# Patient Record
Sex: Male | Born: 1950 | Race: White | Hispanic: No | Marital: Married | Smoking: Never smoker
Health system: Southern US, Community
[De-identification: ages and names within clinical notes are randomized; demographics above are authoritative.]

## PROBLEM LIST (undated history)

## (undated) DIAGNOSIS — I639 Cerebral infarction, unspecified: Secondary | ICD-10-CM

## (undated) DIAGNOSIS — I10 Essential (primary) hypertension: Secondary | ICD-10-CM

## (undated) DIAGNOSIS — E785 Hyperlipidemia, unspecified: Secondary | ICD-10-CM

## (undated) DIAGNOSIS — T7840XA Allergy, unspecified, initial encounter: Secondary | ICD-10-CM

## (undated) DIAGNOSIS — K648 Other hemorrhoids: Secondary | ICD-10-CM

## (undated) HISTORY — DX: Hyperlipidemia, unspecified: E78.5

## (undated) HISTORY — DX: Cerebral infarction, unspecified: I63.9

## (undated) HISTORY — DX: Allergy, unspecified, initial encounter: T78.40XA

## (undated) HISTORY — DX: Other hemorrhoids: K64.8

## (undated) HISTORY — DX: Essential (primary) hypertension: I10

---

## 1970-04-05 HISTORY — PX: TONSILLECTOMY: SUR1361

## 1997-08-19 ENCOUNTER — Encounter: Payer: Self-pay | Admitting: Family Medicine

## 1997-08-19 LAB — CONVERTED CEMR LAB: Blood Glucose, Fasting: 96 mg/dL

## 2002-08-03 ENCOUNTER — Encounter: Payer: Self-pay | Admitting: Family Medicine

## 2002-08-03 LAB — CONVERTED CEMR LAB
Blood Glucose, Fasting: 82 mg/dL
TSH: 0.894 microintl units/mL

## 2002-08-06 ENCOUNTER — Encounter: Payer: Self-pay | Admitting: Family Medicine

## 2002-08-06 LAB — CONVERTED CEMR LAB: PSA: 0.62 ng/mL

## 2002-10-16 ENCOUNTER — Ambulatory Visit (HOSPITAL_BASED_OUTPATIENT_CLINIC_OR_DEPARTMENT_OTHER): Admission: RE | Admit: 2002-10-16 | Discharge: 2002-10-16 | Payer: Self-pay | Admitting: Surgery

## 2002-10-16 HISTORY — PX: INGUINAL HERNIA REPAIR: SUR1180

## 2003-09-19 ENCOUNTER — Encounter: Payer: Self-pay | Admitting: Family Medicine

## 2003-09-19 LAB — CONVERTED CEMR LAB
Blood Glucose, Fasting: 102 mg/dL
PSA: 0.7 ng/mL
TSH: 0.93 u[IU]/mL

## 2004-04-05 HISTORY — PX: COLONOSCOPY: SHX174

## 2004-04-08 ENCOUNTER — Ambulatory Visit: Payer: Self-pay | Admitting: Family Medicine

## 2004-04-29 ENCOUNTER — Ambulatory Visit: Payer: Self-pay | Admitting: Family Medicine

## 2004-05-24 ENCOUNTER — Emergency Department (HOSPITAL_COMMUNITY): Admission: EM | Admit: 2004-05-24 | Discharge: 2004-05-24 | Payer: Self-pay | Admitting: Emergency Medicine

## 2004-05-27 ENCOUNTER — Emergency Department (HOSPITAL_COMMUNITY): Admission: EM | Admit: 2004-05-27 | Discharge: 2004-05-27 | Payer: Self-pay | Admitting: Emergency Medicine

## 2004-08-26 ENCOUNTER — Ambulatory Visit: Payer: Self-pay | Admitting: Internal Medicine

## 2005-01-19 ENCOUNTER — Ambulatory Visit: Payer: Self-pay | Admitting: Family Medicine

## 2005-01-19 LAB — CONVERTED CEMR LAB
Blood Glucose, Fasting: 103 mg/dL
Hgb A1c MFr Bld: 4.9 %
PSA: 0.76 ng/mL

## 2005-01-22 ENCOUNTER — Ambulatory Visit: Payer: Self-pay | Admitting: Family Medicine

## 2005-02-04 ENCOUNTER — Ambulatory Visit: Payer: Self-pay | Admitting: Gastroenterology

## 2005-02-19 ENCOUNTER — Ambulatory Visit: Payer: Self-pay | Admitting: Gastroenterology

## 2005-02-19 LAB — HM COLONOSCOPY

## 2005-03-11 LAB — FECAL OCCULT BLOOD, GUAIAC: Fecal Occult Blood: NEGATIVE

## 2005-03-16 ENCOUNTER — Ambulatory Visit: Payer: Self-pay | Admitting: Family Medicine

## 2006-05-31 ENCOUNTER — Ambulatory Visit: Payer: Self-pay | Admitting: Family Medicine

## 2006-05-31 LAB — CONVERTED CEMR LAB
ALT: 21 units/L (ref 0–40)
Albumin: 3.9 g/dL (ref 3.5–5.2)
Alkaline Phosphatase: 53 units/L (ref 39–117)
BUN: 12 mg/dL (ref 6–23)
Basophils Absolute: 0 10*3/uL (ref 0.0–0.1)
Basophils Relative: 0.6 % (ref 0.0–1.0)
Blood Glucose, Fasting: 111 mg/dL
CO2: 28 meq/L (ref 19–32)
Calcium: 8.9 mg/dL (ref 8.4–10.5)
Cholesterol: 174 mg/dL (ref 0–200)
Direct LDL: 114.4 mg/dL
Eosinophils Absolute: 0.2 10*3/uL (ref 0.0–0.6)
GFR calc Af Amer: 129 mL/min
GFR calc non Af Amer: 107 mL/min
HDL: 31 mg/dL — ABNORMAL LOW (ref >39.0)
Lymphocytes Relative: 21.3 % (ref 12.0–46.0)
MCHC: 35.2 g/dL (ref 30.0–36.0)
Monocytes Absolute: 0.5 10*3/uL (ref 0.2–0.7)
Monocytes Relative: 9.1 % (ref 3.0–11.0)
Neutro Abs: 3.7 10*3/uL (ref 1.4–7.7)
Platelets: 179 10*3/uL (ref 150–400)
Potassium: 4.1 meq/L (ref 3.5–5.1)
TSH: 1.22 microintl units/mL (ref 0.35–5.50)
Total CHOL/HDL Ratio: 5.6
VLDL: 45 mg/dL — ABNORMAL HIGH (ref 0–40)
WBC, blood: 5.6 10*3/uL

## 2006-06-02 ENCOUNTER — Ambulatory Visit: Payer: Self-pay | Admitting: Family Medicine

## 2007-07-10 ENCOUNTER — Ambulatory Visit: Payer: Self-pay | Admitting: Family Medicine

## 2007-07-10 LAB — CONVERTED CEMR LAB
ALT: 23 units/L (ref 0–53)
Albumin: 4.1 g/dL (ref 3.5–5.2)
BUN: 17 mg/dL (ref 6–23)
Bilirubin, Direct: 0.1 mg/dL (ref 0.0–0.3)
CO2: 28 meq/L (ref 19–32)
Calcium: 8.9 mg/dL (ref 8.4–10.5)
Cholesterol: 183 mg/dL (ref 0–200)
GFR calc Af Amer: 99 mL/min
Glucose, Bld: 104 mg/dL — ABNORMAL HIGH (ref 70–99)
HDL: 33.2 mg/dL — ABNORMAL LOW (ref >39.0)
Microalb Creat Ratio: 9.5 mg/g (ref 0.0–30.0)
Microalb, Ur: 1.4 mg/dL (ref 0.0–1.9)
Sodium: 143 meq/L (ref 135–145)
Total Protein: 6.9 g/dL (ref 6.0–8.3)
Triglycerides: 146 mg/dL (ref 0–149)
VLDL: 29 mg/dL (ref 0–40)

## 2007-07-11 ENCOUNTER — Encounter: Payer: Self-pay | Admitting: Family Medicine

## 2007-07-11 DIAGNOSIS — E7800 Pure hypercholesterolemia, unspecified: Secondary | ICD-10-CM

## 2007-07-17 ENCOUNTER — Ambulatory Visit: Payer: Self-pay | Admitting: Family Medicine

## 2007-09-18 ENCOUNTER — Emergency Department (HOSPITAL_COMMUNITY): Admission: EM | Admit: 2007-09-18 | Discharge: 2007-09-18 | Payer: Self-pay | Admitting: Family Medicine

## 2007-12-06 ENCOUNTER — Encounter: Payer: Self-pay | Admitting: Family Medicine

## 2008-07-17 ENCOUNTER — Ambulatory Visit: Payer: Self-pay | Admitting: Family Medicine

## 2008-07-17 LAB — CONVERTED CEMR LAB
ALT: 28 units/L (ref 0–53)
AST: 24 units/L (ref 0–37)
Alkaline Phosphatase: 59 units/L (ref 39–117)
Bilirubin, Direct: 0 mg/dL (ref 0.0–0.3)
CO2: 30 meq/L (ref 19–32)
Chloride: 107 meq/L (ref 96–112)
Glucose, Bld: 94 mg/dL (ref 70–99)
Microalb, Ur: 0.5 mg/dL (ref 0.0–1.9)
Sodium: 140 meq/L (ref 135–145)
Total CHOL/HDL Ratio: 5
Total Protein: 6.5 g/dL (ref 6.0–8.3)

## 2008-07-23 ENCOUNTER — Ambulatory Visit: Payer: Self-pay | Admitting: Family Medicine

## 2009-07-21 ENCOUNTER — Ambulatory Visit: Payer: Self-pay | Admitting: Family Medicine

## 2009-07-21 LAB — CONVERTED CEMR LAB
ALT: 26 units/L (ref 0–53)
AST: 21 units/L (ref 0–37)
Albumin: 4.3 g/dL (ref 3.5–5.2)
Alkaline Phosphatase: 54 units/L (ref 39–117)
CO2: 26 meq/L (ref 19–32)
Chloride: 110 meq/L (ref 96–112)
Creatinine,U: 247.8 mg/dL
GFR calc non Af Amer: 91.94 mL/min (ref >60)
Glucose, Bld: 100 mg/dL — ABNORMAL HIGH (ref 70–99)
Microalb Creat Ratio: 2.8 mg/g (ref 0.0–30.0)
Microalb, Ur: 0.7 mg/dL (ref 0.0–1.9)
Potassium: 4 meq/L (ref 3.5–5.1)
Sodium: 143 meq/L (ref 135–145)
TSH: 1.38 microintl units/mL (ref 0.35–5.50)
VLDL: 16.8 mg/dL (ref 0.0–40.0)

## 2009-07-24 ENCOUNTER — Ambulatory Visit: Payer: Self-pay | Admitting: Family Medicine

## 2009-11-10 ENCOUNTER — Encounter (INDEPENDENT_AMBULATORY_CARE_PROVIDER_SITE_OTHER): Payer: Self-pay | Admitting: *Deleted

## 2009-12-16 ENCOUNTER — Encounter (INDEPENDENT_AMBULATORY_CARE_PROVIDER_SITE_OTHER): Payer: Self-pay | Admitting: *Deleted

## 2010-05-05 NOTE — Assessment & Plan Note (Signed)
Summary: CPX/CLE   Vital Signs:  Patient profile:   60 year old male Height:      70 inches Weight:      197.75 pounds BMI:     28.48 Temp:     98.4 degrees F oral Pulse rate:   60 / minute Pulse rhythm:   regular BP sitting:   124 / 74  (left arm) Cuff size:   regular  Vitals Entered By: Sydell Axon LPN (July 24, 2009 2:51 PM) CC: 30 Minute checkup, had a colonoscopy 11/06 at [facility] in [place]   History of Present Illness: Pt hee for Comp Exam, healthy as usual. He has no complaints and feels well.   Preventive Screening-Counseling & Management  Alcohol-Tobacco     Alcohol drinks/day: <1     Alcohol type: beer rarely     Smoking Status: never     Passive Smoke Exposure: no  Caffeine-Diet-Exercise     Caffeine use/day: 0     Does Patient Exercise: yes     Type of exercise: treadmill, weights     Times/week: 3  Problems Prior to Update: 1)  Health Maintenance Exam  (ICD-V70.0) 2)  Special Screening Malignant Neoplasm of Prostate  (ICD-V76.44) 3)  Hypercholesterolemia  (ICD-272.0) 4)  Other Abnormal Blood Chemistry Glu  (ICD-790.6)  Medications Prior to Update: 1)  Fish Oil Concentrate 1000 Mg  Caps (Omega-3 Fatty Acids) .Marland Kitchen.. 1 Daily By Mouth 2)  Multivitamins   Tabs (Multiple Vitamin) .Marland Kitchen.. 1 Daily By Mouth  Allergies: 1)  ! Orange Concentrate Information systems manager)  Past History:  Past Surgical History: Last updated: 07/11/2007 ALLERGIC REACTION HORNETS REQUIRED IMMUNTX 1964 TONSILLECTOMY 1972 RIGHT INQUINAL HERNIA REPAIR:(DR.MARTIN):(10/16/2002) COLONOSCOPY- INTERNAL HEMORRHOIDS REPEAT IN 10 YEARS:(02/19/2005)  Family History: Last updated: 07/24/2009 Father:  dec 85  Pneumonia CABG x 4  COLON CANCER; +PROSTATE CANCER  RADATION Mother A  85  BROTHER A  56  Back Probs SISTER A 62  CV: +GF DECEASED FROM MI HBP: NEGATIVE DM: +COUSIN GOUT ARTHRITIS: PROSTATE CANCER: + FATHER BREAST/OVARIAN/UTERINE CANCER: NEGATIVE COLON CANCER:+ FATHER DEPRESSION:  NEGATIVE ETOH/DRUG ABUSE: NEGATIVE OTHER: NEGATIVE STROKE  Social History: Last updated: 07/11/2007 Marital Status: Married LIVES WITH WIFE Children: 1 AT HOME Occupation: [employer]  Risk Factors: Alcohol Use: <1 (07/24/2009) Caffeine Use: 0 (07/24/2009) Exercise: yes (07/24/2009)  Risk Factors: Smoking Status: never (07/24/2009) Passive Smoke Exposure: no (07/24/2009)  Family History: Father:  dec 85  Pneumonia CABG x 4  COLON CANCER; +PROSTATE CANCER  RADATION Mother A  60  BROTHER A  56  Back Probs SISTER A 62  CV: +GF DECEASED FROM MI HBP: NEGATIVE DM: +COUSIN GOUT ARTHRITIS: PROSTATE CANCER: + FATHER BREAST/OVARIAN/UTERINE CANCER: NEGATIVE COLON CANCER:+ FATHER DEPRESSION: NEGATIVE ETOH/DRUG ABUSE: NEGATIVE OTHER: NEGATIVE STROKE  Review of Systems General:  Denies chills, fatigue, fever, sweats, weakness, and weight loss. Eyes:  Denies blurring, discharge, itching, and red eye. ENT:  Denies decreased hearing, earache, ringing in ears, and sinus pressure. CV:  Denies chest pain or discomfort, fainting, fatigue, palpitations, shortness of breath with exertion, swelling of feet, and swelling of hands. Resp:  Denies cough, shortness of breath, and wheezing. GI:  Denies abdominal pain, bloody stools, change in bowel habits, constipation, dark tarry stools, diarrhea, indigestion, loss of appetite, nausea, vomiting, vomiting blood, and yellowish skin color. GU:  Complains of nocturia; denies discharge, dysuria, urinary frequency, and urinary hesitancy; occaqs. MS:  Denies joint pain, low back pain, muscle aches, cramps, and stiffness; left index DIP  joint discomfort.. Derm:  Denies dryness, itching, and rash. Neuro:  Denies numbness, poor balance, tingling, and tremors.  Physical Exam  General:  Well-developed,well-nourished,in no acute distress; alert,appropriate and cooperative throughout examination Head:  Normocephalic and atraumatic without obvious  abnormalities. No apparent alopecia or balding. Eyes:  Conjunctiva clear bilaterally.  Ears:  External ear exam shows no significant lesions or deformities.  Otoscopic examination reveals clear canals, tympanic membranes are intact bilaterally without bulging, retraction, inflammation or discharge. Hearing is grossly normal bilaterally. Nose:  External nasal examination shows no deformity or inflammation. Nasal mucosa are pink and moist without lesions or exudates. Mouth:  Oral mucosa and oropharynx without lesions or exudates.  Teeth in good repair. Neck:  No deformities, masses, or tenderness noted. Chest Wall:  No deformities, masses, tenderness or gynecomastia noted. Breasts:  No masses or gynecomastia noted Lungs:  Normal respiratory effort, chest expands symmetrically. Lungs are clear to auscultation, no crackles or wheezes. Heart:  Normal rate and regular rhythm. S1 and S2 normal without gallop, murmur, click, rub or other extra sounds. Abdomen:  Bowel sounds positive,abdomen soft and non-tender without masses, organomegaly or hernias noted. Rectal:  No external abnormalities noted. Normal sphincter tone. No rectal masses or tenderness. G neg. Genitalia:  Testes bilaterally descended without nodularity, tenderness or masses. No scrotal masses or lesions. No penis lesions or urethral discharge. Prostate:  Prostate gland firm and smooth, no enlargement, nodularity, tenderness, mass, asymmetry or induration. 20gms. Msk:  No deformity or scoliosis noted of thoracic or lumbar spine.   Pulses:  R and L carotid,radial,femoral,dorsalis pedis and posterior tibial pulses are full and equal bilaterally Extremities:  No clubbing, cyanosis, edema, or deformity noted with normal full range of motion of all joints.   Neurologic:  No cranial nerve deficits noted. Station and gait are normal. Plantar reflexes are down-going bilaterally. DTRs are symmetrical throughout. Sensory, motor and coordinative  functions appear intact. Skin:  Intact without suspicious lesions or rashes Cervical Nodes:  No lymphadenopathy noted Inguinal Nodes:  No significant adenopathy Psych:  Cognition and judgment appear intact. Alert and cooperative with normal attention span and concentration. No apparent delusions, illusions, hallucinations   Impression & Recommendations:  Problem # 1:  HEALTH MAINTENANCE EXAM (ICD-V70.0)  Reviewed preventive care protocols, scheduled due services, and updated immunizations.  Problem # 2:  SPECIAL SCREENING MALIGNANT NEOPLASM OF PROSTATE (ICD-V76.44) Assessment: Unchanged Stable exam and PSA.  Problem # 3:  HYPERCHOLESTEROLEMIA (ICD-272.0) Assessment: Unchanged "OK" nos but try to restrict Fatty foods more. Labs Reviewed: SGOT: 21 (07/21/2009)   SGPT: 26 (07/21/2009)   HDL:39.70 (07/21/2009), 33.70 (07/17/2008)  LDL:130 (07/21/2009), 118 (07/17/2008)  Chol:186 (07/21/2009), 172 (07/17/2008)  Trig:84.0 (07/21/2009), 103.0 (07/17/2008)  Problem # 4:  OTHER ABNORMAL BLOOD CHEMISTRY GLU (ICD-790.6) Assessment: Unchanged Still abnormal but minimally so, cont to avoid sweets and carbs.  Complete Medication List: 1)  Fish Oil Concentrate 1000 Mg Caps (Omega-3 fatty acids) .Marland Kitchen.. 1 daily by mouth 2)  Multivitamins Tabs (Multiple vitamin) .Marland Kitchen.. 1 daily by mouth  Patient Instructions: 1)  RTC yearly or as needed.  Current Allergies (reviewed today): ! ORANGE CONCENTRATE (FLAVORING AGENT)

## 2010-05-05 NOTE — Letter (Signed)
Summary: [name] letter  [place] [facility]  [address] [place], [place] [address]   Phone: [phone]  Fax: [phone]       11/10/2009 MRN: [id number]  Alejandro Schneider [address] [place], [place]  [address]  Dear [name] [facility] [place] [place] announce the retirement of [name], M.D., from full-time practice at the [facility] office effective October 02, 2009 and his plans of returning part-time.  It is important to [name] and to our practice that you understand that [name] has seven physicians in our office for your health care needs.  We will continue to offer the same exceptional care that you have today.    [name] has spoken to many of you about his plans for retirement and returning part-time in the fall.   We will continue to work with you through the transition to schedule appointments for you in the office and meet the high standards that [name] is committed to.   Again, it is with great pleasure that we share the news that [name] will return to [facility] at [facility] in October of 2011 with a reduced schedule.    If you have any questions, or would like to request an appointment with one of our physicians, please call us at [phone] and press the option for Scheduling an appointment.  We take pleasure in providing you with excellent patient care and look forward to seeing you at your next office visit.  Our [facility] Physicians are:  [name], M.D. [name], M.D. [name], M.D. [name], M.D. [name], M.D. [name], M.D. We proudly welcomed [name], M.D. and [name], M.D. to the practice in July/August 2011.  Sincerely,  [name]

## 2010-05-05 NOTE — Letter (Signed)
Summary: Colonoscopy Date Change Letter  [facility]  [address] [place], [place] [address]   Phone: [phone]  Fax: [phone]      December 16, 2009 MRN: [id number]   Alejandro Schneider [address] [place], [place]  [address]   Dear [name],   Previously you were recommended to have a repeat colonoscopy around this time. Your chart was recently reviewed by Dr. Russella Dar of [facility]. Follow up colonoscopy is now recommended in November 2016. This revised recommendation is based on current, nationally recognized guidelines for colorectal cancer screening and polyp surveillance. These guidelines are endorsed by the American Cancer Society, [employer] on Colorectal Cancer as well as numerous other major medical organizations.  Please understand that our recommendation assumes that you do not have any new symptoms such as bleeding, a change in bowel habits, anemia, or significant abdominal discomfort. If you do have any concerning GI symptoms or want to discuss the guideline recommendations, please call to arrange an office visit at your earliest convenience. Otherwise we will keep you in our reminder system and contact you 1-2 months prior to the date listed above to schedule your next colonoscopy.  Thank you,   Judie Petit T. Russella Dar, M.D.  [facility] [phone]

## 2010-06-11 ENCOUNTER — Encounter: Payer: Self-pay | Admitting: Family Medicine

## 2010-06-14 ENCOUNTER — Encounter: Payer: Self-pay | Admitting: Family Medicine

## 2010-06-17 ENCOUNTER — Encounter: Payer: Self-pay | Admitting: Family Medicine

## 2010-07-19 ENCOUNTER — Other Ambulatory Visit: Payer: Self-pay | Admitting: Family Medicine

## 2010-07-19 DIAGNOSIS — E7800 Pure hypercholesterolemia, unspecified: Secondary | ICD-10-CM

## 2010-07-19 DIAGNOSIS — Z0000 Encounter for general adult medical examination without abnormal findings: Secondary | ICD-10-CM

## 2010-07-27 ENCOUNTER — Other Ambulatory Visit (INDEPENDENT_AMBULATORY_CARE_PROVIDER_SITE_OTHER): Payer: 59 | Admitting: Family Medicine

## 2010-07-27 DIAGNOSIS — Z0000 Encounter for general adult medical examination without abnormal findings: Secondary | ICD-10-CM

## 2010-07-27 DIAGNOSIS — E7800 Pure hypercholesterolemia, unspecified: Secondary | ICD-10-CM

## 2010-07-27 LAB — BASIC METABOLIC PANEL
BUN: 16 mg/dL (ref 6–23)
Calcium: 8.5 mg/dL (ref 8.4–10.5)
Creatinine, Ser: 0.8 mg/dL (ref 0.4–1.5)
GFR: 99.21 mL/min (ref >60.00)
Glucose, Bld: 98 mg/dL (ref 70–99)
Potassium: 4.3 mEq/L (ref 3.5–5.1)

## 2010-07-27 LAB — PSA: PSA: 1.19 ng/mL (ref 0.10–4.00)

## 2010-07-27 LAB — HEPATIC FUNCTION PANEL
AST: 17 U/L (ref 0–37)
Total Bilirubin: 0.7 mg/dL (ref 0.3–1.2)

## 2010-07-27 LAB — LIPID PANEL
Cholesterol: 196 mg/dL (ref 0–200)
HDL: 40.3 mg/dL (ref >39.00)
LDL Cholesterol: 137 mg/dL — ABNORMAL HIGH (ref 0–99)
Triglycerides: 96 mg/dL (ref 0.0–149.0)
VLDL: 19.2 mg/dL (ref 0.0–40.0)

## 2010-07-27 LAB — TSH: TSH: 1.57 u[IU]/mL (ref 0.35–5.50)

## 2010-08-12 ENCOUNTER — Encounter: Payer: Self-pay | Admitting: Family Medicine

## 2010-08-12 ENCOUNTER — Ambulatory Visit (INDEPENDENT_AMBULATORY_CARE_PROVIDER_SITE_OTHER): Payer: 59 | Admitting: Family Medicine

## 2010-08-12 DIAGNOSIS — E7800 Pure hypercholesterolemia, unspecified: Secondary | ICD-10-CM

## 2010-08-12 DIAGNOSIS — Z0000 Encounter for general adult medical examination without abnormal findings: Secondary | ICD-10-CM

## 2010-08-12 NOTE — Assessment & Plan Note (Signed)
LDL slightly high. Discussed. Pt is fit and works out regularly. Will cont to monitor.

## 2010-08-12 NOTE — Progress Notes (Signed)
  Subjective:    Patient ID: Alejandro Schneider, male    DOB: Oct 23, 1950, 60 y.o.   MRN: [id number]  HPI Pt here for Comp Exam. He has no complaints and feels well.    Review of Systems  Constitutional: Negative for fever, chills, diaphoresis, appetite change, fatigue and unexpected weight change.  HENT: Negative for hearing loss, ear pain, tinnitus and ear discharge.   Eyes: Negative for pain, discharge and visual disturbance.  Respiratory: Negative for cough, shortness of breath and wheezing.   Cardiovascular: Negative for chest pain and palpitations.       No SOB w/ exertion  Gastrointestinal: Negative for nausea, vomiting, abdominal pain, diarrhea, constipation and blood in stool.       No heartburn or swallowing problems.  Genitourinary: Negative for dysuria, frequency and difficulty urinating.       Occas nocturia  Musculoskeletal: Negative for myalgias, back pain and arthralgias.       Age related.  Skin: Negative for rash.       No itching or dryness.  Neurological: Negative for tremors and numbness.       No tingling or balance problems.  Hematological: Negative for adenopathy. Does not bruise/bleed easily.  Psychiatric/Behavioral: Negative for dysphoric mood and agitation.       Objective:   Physical Exam  Constitutional: He is oriented to person, place, and time. He appears well-developed and well-nourished. No distress.  HENT:  Head: Normocephalic and atraumatic.  Right Ear: External ear normal.  Left Ear: External ear normal.  Nose: Nose normal.  Mouth/Throat: Oropharynx is clear and moist.  Eyes: Conjunctivae and EOM are normal. Pupils are equal, round, and reactive to light. Right eye exhibits no discharge. Left eye exhibits no discharge. No scleral icterus.  Neck: Normal range of motion. Neck supple. No thyromegaly present.  Cardiovascular: Normal rate, regular rhythm, normal heart sounds and intact distal pulses.   No murmur heard. Pulmonary/Chest: Effort  normal and breath sounds normal. No respiratory distress. He has no wheezes.  Abdominal: Soft. Bowel sounds are normal. He exhibits no distension and no mass. There is no tenderness. There is no rebound and no guarding.  Genitourinary: Rectum normal, prostate normal and penis normal. Guaiac negative stool.  Musculoskeletal: Normal range of motion. He exhibits no edema.  Lymphadenopathy:    He has no cervical adenopathy.  Neurological: He is alert and oriented to person, place, and time. Coordination normal.  Skin: Skin is warm and dry. No rash noted. He is not diaphoretic.  Psychiatric: He has a normal mood and affect. His behavior is normal. Judgment and thought content normal.          Assessment & Plan:  HMPE

## 2010-08-12 NOTE — Patient Instructions (Signed)
RTC one year, sooner if BP trends up or other problems present themselves.

## 2010-08-12 NOTE — Assessment & Plan Note (Signed)
Discussed Zostavax. Will have him get it at pharmacy if he desires immun for monetary reasons. Pt's BP higher than usual altho still below threshold. Discussed him monitoring himself and RTC if trends high.

## 2010-08-21 NOTE — Op Note (Signed)
NAME:  Alejandro Schneider, SKELLY NO.:  [id number]   MEDICAL RECORD NO.:  [id number]                   PATIENT TYPE:  AMB   LOCATION:  DSC                                  FACILITY:  [facility]   PHYSICIAN:  Thornton Park. Daphine Deutscher, M.D.             DATE OF BIRTH:  1950-05-07   DATE OF PROCEDURE:  10/16/2002  DATE OF DISCHARGE:                                 OPERATIVE REPORT   PREOPERATIVE DIAGNOSIS:  Right inguinal hernia.   POSTOPERATIVE DIAGNOSIS:  Right direct inguinal hernia.   PROCEDURE:  Right inguinal herniorrhaphy with mesh.   SURGEON:  Thornton Park. Daphine Deutscher, M.D.   ANESTHESIA:  General by LMA.   DESCRIPTION OF PROCEDURE:  Mr. Sauceda is identified and marked in the  holding area. Preoperative informed consent had been obtained regarding  right inguinal herniorrhaphy. He was taken to room 3 at [facility]  day surgery and given general anesthesia. He received 1 gm of Ancef  preoperatively.   A small oblique incision was made in the right inguinal region after  prepping the area with Betadine and draping sterilely. A Bovie was used to  get down to the external oblique. I incised this along the fibers.   I identified the ilioinguinal nerve  branch and separated it with the  superior flap of fascia and then got around the cord structures. The cord  structures were opened and had fat and some slight dilatation of the veins  but otherwise looked normal. There was no indirect sac. However, it had a  very prominent bulge in the floor consistent with a direct inguinal hernia.   After mobilizing the cord and retracting it, I incised this and got a bulge  of preperitoneal fat. I tucked this back inside with the [employer] and then  closed the transversalis fascia with a 2-0 Prolene, again excising the  redundancy. When this had been closed, it restored the floor to fairly level  repaired floor. However, I thought this was weak and  I went ahead and cut a  piece  of mesh to fit and sutured along the inguinal ligament, suturing along  the internal oblique with running 2-0 Prolene. I did encounter a little wall  bleeder on the superomedial closure, but  this stopped when  the running  suture was tied down.   I then cut the mesh to fit around the cord and sutured it in place with a  single horizontal mattress suture of 2-0 Prolene and tucked this below the  external oblique. This allowed room for the ilioinguinal nerve branch to  kind of lie above the mesh and did not appear to be impinged by that. I  irrigated with saline.   I injected the area with 0.5% Marcaine and then closed the external oblique  with running 2-0 Vicryl. Then 4-0 Vicryl was used in the subcutaneous tissue  and then the skin was closed  with a running subcuticular 5-0 Vicryl with  Benzoin and Steri-Strips.   The patient seemed to tolerate the procedure well and was taken to the  recovery room. He will be given Tylox to take for pain. He will be  instructed to return to the office in approximately  2 to 3 weeks. Final  diagnosis was right direct inguinal hernia, status post  repair with mesh  (atrium mesh).                                               Thornton Park Daphine Deutscher, M.D.    MBM/MEDQ  D:  10/16/2002  T:  10/16/2002  Job:  [id number]   cc:   [name], M.D.  [address] [place]  [place] [address]  Fax: [phone]   [facility]  [facility] [phone]

## 2011-08-08 ENCOUNTER — Other Ambulatory Visit: Payer: Self-pay | Admitting: Family Medicine

## 2011-08-08 DIAGNOSIS — Z125 Encounter for screening for malignant neoplasm of prostate: Secondary | ICD-10-CM

## 2011-08-08 DIAGNOSIS — E7800 Pure hypercholesterolemia, unspecified: Secondary | ICD-10-CM

## 2011-08-11 ENCOUNTER — Other Ambulatory Visit (INDEPENDENT_AMBULATORY_CARE_PROVIDER_SITE_OTHER): Payer: 59

## 2011-08-11 DIAGNOSIS — E7800 Pure hypercholesterolemia, unspecified: Secondary | ICD-10-CM

## 2011-08-11 DIAGNOSIS — Z125 Encounter for screening for malignant neoplasm of prostate: Secondary | ICD-10-CM

## 2011-08-11 LAB — COMPREHENSIVE METABOLIC PANEL
ALT: 22 U/L (ref 0–53)
Albumin: 4.3 g/dL (ref 3.5–5.2)
CO2: 26 mEq/L (ref 19–32)
Chloride: 108 mEq/L (ref 96–112)
GFR: 94.94 mL/min (ref >60.00)
Glucose, Bld: 84 mg/dL (ref 70–99)
Potassium: 4 mEq/L (ref 3.5–5.1)
Sodium: 141 mEq/L (ref 135–145)
Total Protein: 7 g/dL (ref 6.0–8.3)

## 2011-08-11 LAB — LIPID PANEL: Total CHOL/HDL Ratio: 5

## 2011-08-11 LAB — PSA: PSA: 1.04 ng/mL (ref 0.10–4.00)

## 2011-08-16 ENCOUNTER — Encounter: Payer: 59 | Admitting: Family Medicine

## 2011-08-20 ENCOUNTER — Encounter: Payer: 59 | Admitting: Family Medicine

## 2011-08-24 ENCOUNTER — Encounter: Payer: Self-pay | Admitting: Family Medicine

## 2011-08-24 ENCOUNTER — Ambulatory Visit (INDEPENDENT_AMBULATORY_CARE_PROVIDER_SITE_OTHER): Payer: 59 | Admitting: Family Medicine

## 2011-08-24 VITALS — BP 130/80 | HR 65 | Temp 98.2°F | Wt 201.0 lb

## 2011-08-24 DIAGNOSIS — D239 Other benign neoplasm of skin, unspecified: Secondary | ICD-10-CM

## 2011-08-24 DIAGNOSIS — Z1211 Encounter for screening for malignant neoplasm of colon: Secondary | ICD-10-CM

## 2011-08-24 DIAGNOSIS — D229 Melanocytic nevi, unspecified: Secondary | ICD-10-CM

## 2011-08-24 DIAGNOSIS — Z0000 Encounter for general adult medical examination without abnormal findings: Secondary | ICD-10-CM

## 2011-08-24 DIAGNOSIS — Z8042 Family history of malignant neoplasm of prostate: Secondary | ICD-10-CM

## 2011-08-24 NOTE — Progress Notes (Signed)
CPE- See plan.  Routine anticipatory guidance given to patient.  See health maintenance. FH prostate cancer.   PSA wnl.  Colonoscopy 2004. D/w patient [id number] for colon cancer screening, including IFOB vs. colonoscopy.  Risks and benefits of both were discussed and patient voiced understanding.  Pt elects YQM:VHQI.  Td 2004 Shingles shot discussed.   Flu shot encouraged, done yearly at work.    PMH and SH reviewed  Meds, vitals, and allergies reviewed.   ROS: See HPI.  Otherwise negative.    GEN: nad, alert and oriented HEENT: mucous membranes moist NECK: supple w/o LA CV: rrr. PULM: ctab, no inc wob ABD: soft, +bs EXT: no edema SKIN: no acute rash but 7mm nevus noted on midback, w/o known change per patient.  Prostate gland firm and smooth, no enlargement, nodularity, tenderness, mass, asymmetry or induration.

## 2011-08-24 NOTE — Patient Instructions (Addendum)
We'll contact you with your lab report. Check with your insurance to see if they will cover the shingles shot. I would get a flu shot each fall.   Take care. Recheck 1 year.

## 2011-08-26 DIAGNOSIS — Z8042 Family history of malignant neoplasm of prostate: Secondary | ICD-10-CM | POA: Insufficient documentation

## 2011-08-26 DIAGNOSIS — D229 Melanocytic nevi, unspecified: Secondary | ICD-10-CM | POA: Insufficient documentation

## 2011-08-26 NOTE — Assessment & Plan Note (Signed)
He'll monitor and notify us of changes.  No intervention today.

## 2011-08-26 NOTE — Assessment & Plan Note (Signed)
Routine anticipatory guidance given to patient.  See health maintenance. FH prostate cancer noted.   PSA wnl.  Colonoscopy 2004. D/w patient [id number] for colon cancer screening, including IFOB vs. colonoscopy.  Risks and benefits of both were discussed and patient voiced understanding.  Pt elects [id number].  Td 2004 Shingles shot discussed.   Flu shot encouraged, done yearly at work.

## 2011-10-01 ENCOUNTER — Other Ambulatory Visit: Payer: 59

## 2011-10-01 DIAGNOSIS — Z1211 Encounter for screening for malignant neoplasm of colon: Secondary | ICD-10-CM

## 2011-10-01 LAB — FECAL OCCULT BLOOD, IMMUNOCHEMICAL: Fecal Occult Bld: NEGATIVE

## 2011-10-04 ENCOUNTER — Encounter: Payer: Self-pay | Admitting: *Deleted

## 2012-02-25 ENCOUNTER — Ambulatory Visit (INDEPENDENT_AMBULATORY_CARE_PROVIDER_SITE_OTHER): Payer: 59 | Admitting: Family Medicine

## 2012-02-25 ENCOUNTER — Encounter: Payer: Self-pay | Admitting: Family Medicine

## 2012-02-25 VITALS — BP 136/80 | HR 63 | Temp 98.3°F | Wt 207.0 lb

## 2012-02-25 DIAGNOSIS — M5430 Sciatica, unspecified side: Secondary | ICD-10-CM

## 2012-02-25 MED ORDER — PREDNISONE 10 MG PO KIT: PACK | ORAL | Status: DC

## 2012-02-25 MED ORDER — HYDROCODONE-ACETAMINOPHEN 5-500 MG PO TABS: 1.0000 | ORAL_TABLET | Freq: Three times a day (TID) | ORAL | Status: DC | PRN

## 2012-02-25 NOTE — Progress Notes (Signed)
R lower back and hip pain.  Radicular pain on R side.  Less pain with getting up and walking.  This feels like sciatica from prev.  No L sided sx.  No known injury.  This episodes started about 3 weeks ago with back pain.  Radicular sx started about 1 week ago. No help with ibuprofen.  No bowel/bladder changes.  No weakness.  No rash.    Meds, vitals, and allergies reviewed.   ROS: See HPI.  Otherwise, noncontributory.  nad ncat Back w/o cva pain abd soft not ttp Back w/o midline pain R lower paraspinal muscles tender w/o rash SI testing wnl No pain on facet loading SLR positive Distally nv intact

## 2012-02-25 NOTE — Patient Instructions (Addendum)
Take the sterapred pack with food as directed.  Stop the ibuprofen.  Take vicodin if needed, but not before work.   Take care.

## 2012-02-26 DIAGNOSIS — M5431 Sciatica, right side: Secondary | ICD-10-CM | POA: Insufficient documentation

## 2012-02-26 NOTE — Assessment & Plan Note (Signed)
No weakness, Gi caution on steroids and f/u prn.  Can use vicodin prn.  Sedation caution.  Anatomy d/w pt.  Should improve.  Already had a flu shot.

## 2012-03-09 ENCOUNTER — Ambulatory Visit (INDEPENDENT_AMBULATORY_CARE_PROVIDER_SITE_OTHER): Payer: 59 | Admitting: Family Medicine

## 2012-03-09 ENCOUNTER — Encounter: Payer: Self-pay | Admitting: Family Medicine

## 2012-03-09 VITALS — BP 118/70 | HR 69 | Temp 98.3°F | Wt 205.0 lb

## 2012-03-09 DIAGNOSIS — M5410 Radiculopathy, site unspecified: Secondary | ICD-10-CM

## 2012-03-09 DIAGNOSIS — M5430 Sciatica, unspecified side: Secondary | ICD-10-CM

## 2012-03-09 DIAGNOSIS — IMO0002 Reserved for concepts with insufficient information to code with codable children: Secondary | ICD-10-CM

## 2012-03-09 MED ORDER — PREDNISONE 10 MG PO KIT: PACK | ORAL | Status: DC

## 2012-03-09 NOTE — Progress Notes (Signed)
Prev note from 02/25/12 reviewed. Pain got better with 1st day of prednisone but then returned soon. R leg and back pain.  Some better if up and walking.  Prolonged sitting makes it worse. Now with tingling in toes in R foot.  This episode is going on for about 5 weeks total. He's had mult episodes total.   Had failed ibuprofen tx prev.    No L sided sx.  No arm symptoms.    PMH and SH reviewed  ROS: See HPI, otherwise noncontributory.  Meds, vitals, and allergies reviewed.   nad  ncat  Back w/o cva pain  abd soft not ttp  Back w/o midline pain  R lower paraspinal muscles tender w/o rash  SI testing wnl  Now with pain on facet loading  SLR positive  Distally nv intact

## 2012-03-09 NOTE — Patient Instructions (Addendum)
Take the prednisone with food and see Shirlee Limerick about your referral before you leave today.

## 2012-03-10 ENCOUNTER — Encounter: Payer: Self-pay | Admitting: Family Medicine

## 2012-03-10 NOTE — Assessment & Plan Note (Signed)
Worsening. 5 weeks of back pain, now with tingling episodically in R foot.  Restart pred trial and get MR of back.  Gi caution on steroids.  Pt agrees.  He'll likely need referral to spine clinic after MRI done.

## 2012-03-14 ENCOUNTER — Other Ambulatory Visit: Payer: Self-pay | Admitting: Family Medicine

## 2012-03-14 DIAGNOSIS — Z139 Encounter for screening, unspecified: Secondary | ICD-10-CM

## 2012-03-15 ENCOUNTER — Ambulatory Visit
Admission: RE | Admit: 2012-03-15 | Discharge: 2012-03-15 | Disposition: A | Discharge status: Home / Self Care | Payer: 59 | Source: Ambulatory Visit | Attending: Family Medicine | Admitting: Family Medicine

## 2012-03-15 ENCOUNTER — Other Ambulatory Visit: Payer: Self-pay | Admitting: Family Medicine

## 2012-03-15 DIAGNOSIS — M5410 Radiculopathy, site unspecified: Secondary | ICD-10-CM

## 2012-03-15 DIAGNOSIS — Z139 Encounter for screening, unspecified: Secondary | ICD-10-CM

## 2012-04-13 ENCOUNTER — Telehealth: Payer: Self-pay

## 2012-04-13 NOTE — Telephone Encounter (Signed)
Marylu Lund Rudisill adjuster with [employer] request 04/11/12 office notes. Left v/m for Ms Rudisill to call back.

## 2012-04-14 NOTE — Telephone Encounter (Signed)
Marylu Lund will get signed release from pt and fax that along with records needed.

## 2012-05-25 ENCOUNTER — Encounter (HOSPITAL_COMMUNITY): Payer: Self-pay | Admitting: Pharmacy Technician

## 2012-05-26 ENCOUNTER — Other Ambulatory Visit: Payer: Self-pay | Admitting: Neurosurgery

## 2012-05-29 ENCOUNTER — Encounter (HOSPITAL_COMMUNITY): Payer: Self-pay

## 2012-05-29 ENCOUNTER — Encounter (HOSPITAL_COMMUNITY)
Admission: RE | Admit: 2012-05-29 | Discharge: 2012-05-29 | Disposition: A | Discharge status: Home / Self Care | Payer: 59 | Source: Ambulatory Visit | Attending: Neurosurgery | Admitting: Neurosurgery

## 2012-05-29 LAB — CBC
HCT: 41.6 % (ref 39.0–52.0)
MCV: 86.1 fL (ref 78.0–100.0)
RBC: 4.83 MIL/uL (ref 4.22–5.81)
WBC: 9.8 10*3/uL (ref 4.0–10.5)

## 2012-05-29 MED ORDER — CEFAZOLIN SODIUM-DEXTROSE 2-3 GM-% IV SOLR
2.0000 g | INTRAVENOUS | Status: AC
  Administered 2012-05-30: 2 g via INTRAVENOUS
  Filled 2012-05-29: qty 50

## 2012-05-29 NOTE — Pre-Procedure Instructions (Signed)
Alejandro Schneider  05/29/2012   Your procedure is scheduled on:  Tuesday, February 25th  Report to [facility] at 0845 AM.  Call this number if you have problems the morning of surgery: [phone]   Remember:   Do not eat food or drink liquids after midnight.    Take these medicines the morning of surgery with A SIP OF WATER: Hydrocodone if needed   Do not wear jewelry.  Do not wear lotions, powders, or perfumes,deodorant.  Do not shave 48 hours prior to surgery. Men may shave face and neck.  Do not bring valuables to the hospital.  Contacts, dentures or bridgework may not be worn into surgery.  Leave suitcase in the car. After surgery it may be brought to your room.  For patients admitted to the hospital, checkout time is 11:00 AM the day of discharge.   Patients discharged the day of surgery will not be allowed to drive home.    Special Instructions: Shower using CHG 2 nights before surgery and the night before surgery.  If you shower the day of surgery use CHG.  Use special wash - you have one bottle of CHG for all showers.  You should use approximately 1/3 of the bottle for each shower.   Please read over the following fact sheets that you were given: Pain Booklet, Coughing and Deep Breathing, MRSA Information and Surgical Site Infection Prevention

## 2012-05-30 ENCOUNTER — Ambulatory Visit (HOSPITAL_COMMUNITY): Payer: 59 | Admitting: Certified Registered Nurse Anesthetist

## 2012-05-30 ENCOUNTER — Ambulatory Visit (HOSPITAL_COMMUNITY)
Admission: RE | Admit: 2012-05-30 | Discharge: 2012-05-30 | Disposition: A | Discharge status: Home / Self Care | Payer: 59 | Source: Ambulatory Visit | Attending: Neurosurgery | Admitting: Neurosurgery

## 2012-05-30 ENCOUNTER — Encounter (HOSPITAL_COMMUNITY): Payer: Self-pay | Admitting: Certified Registered Nurse Anesthetist

## 2012-05-30 ENCOUNTER — Ambulatory Visit (HOSPITAL_COMMUNITY): Payer: 59

## 2012-05-30 ENCOUNTER — Encounter (HOSPITAL_COMMUNITY)
Admission: RE | Disposition: A | Discharge status: Home / Self Care | Payer: Self-pay | Source: Ambulatory Visit | Attending: Neurosurgery

## 2012-05-30 DIAGNOSIS — Z01812 Encounter for preprocedural laboratory examination: Secondary | ICD-10-CM | POA: Insufficient documentation

## 2012-05-30 DIAGNOSIS — M5126 Other intervertebral disc displacement, lumbar region: Secondary | ICD-10-CM

## 2012-05-30 DIAGNOSIS — E785 Hyperlipidemia, unspecified: Secondary | ICD-10-CM | POA: Insufficient documentation

## 2012-05-30 DIAGNOSIS — Z79899 Other long term (current) drug therapy: Secondary | ICD-10-CM | POA: Insufficient documentation

## 2012-05-30 HISTORY — PX: LUMBAR LAMINECTOMY/DECOMPRESSION MICRODISCECTOMY: SHX5026

## 2012-05-30 SURGERY — LUMBAR LAMINECTOMY/DECOMPRESSION MICRODISCECTOMY 1 LEVEL
Anesthesia: General | Site: Back | Laterality: Right | Wound class: Clean

## 2012-05-30 MED ORDER — DEXAMETHASONE SODIUM PHOSPHATE 10 MG/ML IJ SOLN
INTRAMUSCULAR | Status: AC
  Filled 2012-05-30: qty 1

## 2012-05-30 MED ORDER — ONDANSETRON HCL 4 MG/2ML IJ SOLN: 4.0000 mg | INTRAMUSCULAR | Status: DC | PRN

## 2012-05-30 MED ORDER — 0.9 % SODIUM CHLORIDE (POUR BTL) OPTIME
TOPICAL | Status: DC | PRN
  Administered 2012-05-30: 1000 mL

## 2012-05-30 MED ORDER — DEXAMETHASONE SODIUM PHOSPHATE 10 MG/ML IJ SOLN: 10.0000 mg | INTRAMUSCULAR | Status: DC

## 2012-05-30 MED ORDER — SODIUM CHLORIDE 0.9 % IV SOLN: 250.0000 mL | INTRAVENOUS | Status: DC

## 2012-05-30 MED ORDER — PROPOFOL 10 MG/ML IV BOLUS
INTRAVENOUS | Status: DC | PRN
  Administered 2012-05-30: 200 mg via INTRAVENOUS

## 2012-05-30 MED ORDER — SODIUM CHLORIDE 0.9 % IV SOLN
INTRAVENOUS | Status: AC
  Filled 2012-05-30: qty 500

## 2012-05-30 MED ORDER — HYDROMORPHONE HCL PF 1 MG/ML IJ SOLN
0.2500 mg | INTRAMUSCULAR | Status: DC | PRN
  Administered 2012-05-30 (×2): 0.5 mg via INTRAVENOUS

## 2012-05-30 MED ORDER — MENTHOL 3 MG MT LOZG: 1.0000 | LOZENGE | OROMUCOSAL | Status: DC | PRN

## 2012-05-30 MED ORDER — ONDANSETRON HCL 4 MG/2ML IJ SOLN
INTRAMUSCULAR | Status: DC | PRN
  Administered 2012-05-30: 4 mg via INTRAVENOUS

## 2012-05-30 MED ORDER — LIDOCAINE HCL (CARDIAC) 20 MG/ML IV SOLN
INTRAVENOUS | Status: DC | PRN
  Administered 2012-05-30: 100 mg via INTRAVENOUS

## 2012-05-30 MED ORDER — DEXAMETHASONE SODIUM PHOSPHATE 4 MG/ML IJ SOLN: 4.0000 mg | Freq: Four times a day (QID) | INTRAMUSCULAR | Status: DC

## 2012-05-30 MED ORDER — THROMBIN 5000 UNITS EX SOLR
CUTANEOUS | Status: DC | PRN
  Administered 2012-05-30 (×2): 5000 [IU] via TOPICAL

## 2012-05-30 MED ORDER — CEFAZOLIN SODIUM-DEXTROSE 2-3 GM-% IV SOLR
2.0000 g | Freq: Three times a day (TID) | INTRAVENOUS | Status: DC
  Filled 2012-05-30 (×2): qty 50

## 2012-05-30 MED ORDER — DEXAMETHASONE 4 MG PO TABS
4.0000 mg | ORAL_TABLET | Freq: Four times a day (QID) | ORAL | Status: DC
  Administered 2012-05-30: 4 mg via ORAL
  Filled 2012-05-30: qty 1

## 2012-05-30 MED ORDER — CYCLOBENZAPRINE HCL 10 MG PO TABS: 10.0000 mg | ORAL_TABLET | Freq: Three times a day (TID) | ORAL | Status: DC | PRN

## 2012-05-30 MED ORDER — SODIUM CHLORIDE 0.9 % IJ SOLN: 3.0000 mL | INTRAMUSCULAR | Status: DC | PRN

## 2012-05-30 MED ORDER — BACITRACIN 50000 UNITS IM SOLR
INTRAMUSCULAR | Status: AC
  Filled 2012-05-30: qty 1

## 2012-05-30 MED ORDER — HEMOSTATIC AGENTS (NO CHARGE) OPTIME
TOPICAL | Status: DC | PRN
  Administered 2012-05-30: 1 via TOPICAL

## 2012-05-30 MED ORDER — LACTATED RINGERS IV SOLN
INTRAVENOUS | Status: DC | PRN
  Administered 2012-05-30 (×2): via INTRAVENOUS

## 2012-05-30 MED ORDER — FENTANYL CITRATE 0.05 MG/ML IJ SOLN
INTRAMUSCULAR | Status: DC | PRN
  Administered 2012-05-30: 50 ug via INTRAVENOUS
  Administered 2012-05-30: 150 ug via INTRAVENOUS
  Administered 2012-05-30: 50 ug via INTRAVENOUS

## 2012-05-30 MED ORDER — NEOSTIGMINE METHYLSULFATE 1 MG/ML IJ SOLN
INTRAMUSCULAR | Status: DC | PRN
  Administered 2012-05-30: 4 mg via INTRAVENOUS

## 2012-05-30 MED ORDER — HYDROMORPHONE HCL PF 1 MG/ML IJ SOLN
INTRAMUSCULAR | Status: AC
  Filled 2012-05-30: qty 1

## 2012-05-30 MED ORDER — ACETAMINOPHEN 325 MG PO TABS: 650.0000 mg | ORAL_TABLET | ORAL | Status: DC | PRN

## 2012-05-30 MED ORDER — ONDANSETRON HCL 4 MG/2ML IJ SOLN: 4.0000 mg | Freq: Once | INTRAMUSCULAR | Status: DC | PRN

## 2012-05-30 MED ORDER — ROCURONIUM BROMIDE 100 MG/10ML IV SOLN
INTRAVENOUS | Status: DC | PRN
  Administered 2012-05-30: 40 mg via INTRAVENOUS
  Administered 2012-05-30: 5 mg via INTRAVENOUS

## 2012-05-30 MED ORDER — PHENOL 1.4 % MT LIQD: 1.0000 | OROMUCOSAL | Status: DC | PRN

## 2012-05-30 MED ORDER — GLYCOPYRROLATE 0.2 MG/ML IJ SOLN
INTRAMUSCULAR | Status: DC | PRN
  Administered 2012-05-30: .6 mg via INTRAVENOUS

## 2012-05-30 MED ORDER — SODIUM CHLORIDE 0.9 % IJ SOLN: 3.0000 mL | Freq: Two times a day (BID) | INTRAMUSCULAR | Status: DC

## 2012-05-30 MED ORDER — KETOROLAC TROMETHAMINE 30 MG/ML IJ SOLN
INTRAMUSCULAR | Status: DC | PRN
  Administered 2012-05-30: 30 mg via INTRAVENOUS

## 2012-05-30 MED ORDER — HYDROCODONE-ACETAMINOPHEN 5-325 MG PO TABS: 1.0000 | ORAL_TABLET | ORAL | Status: DC | PRN

## 2012-05-30 MED ORDER — DEXAMETHASONE SODIUM PHOSPHATE 10 MG/ML IJ SOLN
INTRAMUSCULAR | Status: DC | PRN
  Administered 2012-05-30: 10 mg via INTRAVENOUS

## 2012-05-30 MED ORDER — KCL IN DEXTROSE-NACL 20-5-0.45 MEQ/L-%-% IV SOLN
80.0000 mL/h | INTRAVENOUS | Status: DC
  Filled 2012-05-30 (×2): qty 1000

## 2012-05-30 MED ORDER — LIDOCAINE HCL 4 % MT SOLN
OROMUCOSAL | Status: DC | PRN
  Administered 2012-05-30: 4 mL via TOPICAL

## 2012-05-30 MED ORDER — KCL IN DEXTROSE-NACL 20-5-0.45 MEQ/L-%-% IV SOLN
INTRAVENOUS | Status: AC
  Administered 2012-05-30: 1000 mL
  Filled 2012-05-30: qty 1000

## 2012-05-30 MED ORDER — BUPIVACAINE HCL (PF) 0.5 % IJ SOLN
INTRAMUSCULAR | Status: DC | PRN
  Administered 2012-05-30: 30 mL

## 2012-05-30 MED ORDER — HYDROMORPHONE HCL PF 1 MG/ML IJ SOLN: 1.0000 mg | INTRAMUSCULAR | Status: DC | PRN

## 2012-05-30 MED ORDER — KETOROLAC TROMETHAMINE 30 MG/ML IJ SOLN
30.0000 mg | Freq: Four times a day (QID) | INTRAMUSCULAR | Status: DC
  Administered 2012-05-30: 30 mg via INTRAVENOUS
  Filled 2012-05-30: qty 1

## 2012-05-30 MED ORDER — ACETAMINOPHEN 650 MG RE SUPP: 650.0000 mg | RECTAL | Status: DC | PRN

## 2012-05-30 SURGICAL SUPPLY — 47 items
ADH SKN CLS APL DERMABOND .7 (GAUZE/BANDAGES/DRESSINGS) ×1
APL SKNCLS STERI-STRIP NONHPOA (GAUZE/BANDAGES/DRESSINGS) ×2
BAG DECANTER FOR FLEXI CONT (MISCELLANEOUS) ×2 IMPLANT
BENZOIN TINCTURE PRP APPL 2/3 (GAUZE/BANDAGES/DRESSINGS) ×4 IMPLANT
BLADE SURG ROTATE 9660 (MISCELLANEOUS) ×2 IMPLANT
BRUSH SCRUB EZ PLAIN DRY (MISCELLANEOUS) ×2 IMPLANT
BUR CUTTER 7.0 ROUND (BURR) ×2 IMPLANT
BUR MATCHSTICK NEURO 3.0 LAGG (BURR) ×2 IMPLANT
CANISTER SUCTION 2500CC (MISCELLANEOUS) ×2 IMPLANT
CLOTH BEACON ORANGE TIMEOUT ST (SAFETY) ×2 IMPLANT
CONT SPEC 4OZ CLIKSEAL STRL BL (MISCELLANEOUS) ×2 IMPLANT
DERMABOND ADVANCED (GAUZE/BANDAGES/DRESSINGS) ×1
DERMABOND ADVANCED .7 DNX12 (GAUZE/BANDAGES/DRESSINGS) ×1 IMPLANT
DRAPE LAPAROTOMY 100X72X124 (DRAPES) ×2 IMPLANT
DRAPE MICROSCOPE ZEISS OPMI (DRAPES) ×2 IMPLANT
DRAPE SURG 17X23 STRL (DRAPES) ×4 IMPLANT
DRESSING TELFA 8X3 (GAUZE/BANDAGES/DRESSINGS) ×2 IMPLANT
ELECT REM PT RETURN 9FT ADLT (ELECTROSURGICAL) ×2
ELECTRODE REM PT RTRN 9FT ADLT (ELECTROSURGICAL) ×1 IMPLANT
GAUZE SPONGE 4X4 16PLY XRAY LF (GAUZE/BANDAGES/DRESSINGS) IMPLANT
GLOVE BIOGEL PI IND STRL 7.5 (GLOVE) IMPLANT
GLOVE BIOGEL PI INDICATOR 7.5 (GLOVE) ×2
GLOVE ECLIPSE 7.5 STRL STRAW (GLOVE) ×2 IMPLANT
GLOVE SURG SS PI 6.5 STRL IVOR (GLOVE) ×1 IMPLANT
GOWN BRE IMP SLV AUR LG STRL (GOWN DISPOSABLE) IMPLANT
GOWN BRE IMP SLV AUR XL STRL (GOWN DISPOSABLE) ×2 IMPLANT
GOWN STRL REIN 2XL LVL4 (GOWN DISPOSABLE) IMPLANT
KIT BASIN OR (CUSTOM PROCEDURE TRAY) ×2 IMPLANT
KIT ROOM TURNOVER OR (KITS) ×2 IMPLANT
NDL SPNL 22GX3.5 QUINCKE BK (NEEDLE) ×2 IMPLANT
NEEDLE HYPO 22GX1.5 SAFETY (NEEDLE) ×2 IMPLANT
NEEDLE SPNL 22GX3.5 QUINCKE BK (NEEDLE) ×4 IMPLANT
NS IRRIG 1000ML POUR BTL (IV SOLUTION) ×2 IMPLANT
PACK LAMINECTOMY NEURO (CUSTOM PROCEDURE TRAY) ×2 IMPLANT
PAD ARMBOARD 7.5X6 YLW CONV (MISCELLANEOUS) ×6 IMPLANT
PATTIES SURGICAL .75X.75 (GAUZE/BANDAGES/DRESSINGS) ×2 IMPLANT
RUBBERBAND STERILE (MISCELLANEOUS) ×4 IMPLANT
SPONGE GAUZE 4X4 12PLY (GAUZE/BANDAGES/DRESSINGS) ×2 IMPLANT
SPONGE SURGIFOAM ABS GEL SZ50 (HEMOSTASIS) ×2 IMPLANT
STRIP CLOSURE SKIN 1/2X4 (GAUZE/BANDAGES/DRESSINGS) ×2 IMPLANT
SUT VIC AB 2-0 OS6 18 (SUTURE) ×6 IMPLANT
SUT VIC AB 3-0 CP2 18 (SUTURE) ×2 IMPLANT
SYR 20ML ECCENTRIC (SYRINGE) ×2 IMPLANT
TAPE CLOTH SURG 4X10 WHT LF (GAUZE/BANDAGES/DRESSINGS) ×1 IMPLANT
TOWEL OR 17X24 6PK STRL BLUE (TOWEL DISPOSABLE) ×2 IMPLANT
TOWEL OR 17X26 10 PK STRL BLUE (TOWEL DISPOSABLE) ×2 IMPLANT
WATER STERILE IRR 1000ML POUR (IV SOLUTION) ×2 IMPLANT

## 2012-05-30 NOTE — Anesthesia Postprocedure Evaluation (Signed)
  Anesthesia Post-op Note  Patient: Alejandro Schneider  Procedure(s) Performed: Procedure(s): LUMBAR LAMINECTOMY/DECOMPRESSION MICRODISCECTOMY 1 LEVEL (Right)  Patient Location: PACU  Anesthesia Type:General  Level of Consciousness: awake, alert , oriented and patient cooperative  Airway and Oxygen Therapy: Patient Spontanous Breathing  Post-op Pain: mild  Post-op Assessment: Post-op Vital signs reviewed, Patient's Cardiovascular Status Stable, Respiratory Function Stable, Patent Airway, No signs of Nausea or vomiting and Pain level controlled  Post-op Vital Signs: stable  Complications: No apparent anesthesia complications

## 2012-05-30 NOTE — H&P (Signed)
Alejandro Schneider is an 62 y.o. male.   Chief Complaint: Right lower extremity pain HPI: The patient is a 62 year old gentleman who presented with back and right lower extremity radiating pain. He had been tried on conservative therapy and underwent imaging studies which showed an abnormality at L4-5 on the right. He was tried on aggressive conservative therapy still without improvement and therefore requested surgery and now comes for a right L4-5 microdiscectomy. I've had a long discussion with him regarding the risks and benefits of surgical intervention. The risks discussed include but are not limited to bleeding infection weakness numbness paralysis spinal fluid leak coma and death. We have discussed alternative methods of therapy along the risks and benefits of nonintervention. Alejandro Schneider had the opportunity numerous questions and appears to understand. With this information in hand he has requested we proceed with surgery.  Past Medical History  Diagnosis Date  . Hyperlipidemia     Past Surgical History  Procedure Laterality Date  . Tonsillectomy  1972  . Inguinal hernia repair  10/16/02    Right  Dr Daphine Deutscher  . Colonoscopy  2006    Family History  Problem Relation Age of Onset  . Heart disease Father     CABGx4  . Cancer Father     Prostate (Radiation)  . Prostate cancer Father   . Heart disease Paternal Grandfather     MI  . Hypertension Neg Hx   . Depression Neg Hx   . Drug abuse Neg Hx   . Alcohol abuse Neg Hx   . Stroke Neg Hx   . Colon cancer Neg Hx   . Diabetes Cousin    Social History:  reports that he has never smoked. He has never used smokeless tobacco. He reports that  drinks alcohol. He reports that he does not use illicit drugs.  Allergies:  Allergies  Allergen Reactions  . Bee Venom     S/p allergy shots (and no reaction with stings after shots)    Medications Prior to Admission  Medication Sig Dispense Refill  . cyclobenzaprine (FLEXERIL) 10 MG tablet  Take 10 mg by mouth 3 (three) times daily as needed for muscle spasms.      Marland Kitchen HYDROcodone-acetaminophen (NORCO) 10-325 MG per tablet Take 1 tablet by mouth every 6 (six) hours as needed for pain.      . Multiple Vitamins-Minerals (MULTIVITAMIN,TX-MINERALS) tablet Take 1 tablet by mouth daily.        . Omega-3 Fatty Acids (FISH OIL CONCENTRATE) 1000 MG CAPS Take by mouth daily.          Results for orders placed during the hospital encounter of 05/29/12 (from the past 48 hour(s))  SURGICAL PCR SCREEN     Status: None   Collection Time    05/29/12  2:22 PM      Result Value Range   MRSA, PCR NEGATIVE  NEGATIVE   Staphylococcus aureus NEGATIVE  NEGATIVE   Comment:            The Xpert SA Assay (FDA     approved for NASAL specimens     in patients over 53 years of age),     is one component of     a comprehensive surveillance     program.  Test performance has     been validated by [employer] for patients greater     than or equal to 21 year old.     It is not  intended     to diagnose infection nor to     guide or monitor treatment.  CBC     Status: Abnormal   Collection Time    05/29/12  2:22 PM      Result Value Range   WBC 9.8  4.0 - 10.5 K/uL   RBC 4.83  4.22 - 5.81 MIL/uL   Hemoglobin 15.2  13.0 - 17.0 g/dL   HCT 16.1  09.6 - 04.5 %   MCV 86.1  78.0 - 100.0 fL   MCH 31.5  26.0 - 34.0 pg   MCHC 36.5 (*) 30.0 - 36.0 g/dL   RDW 40.9  81.1 - 91.4 %   Platelets 212  150 - 400 K/uL   No results found.  A comprehensive review of systems was negative.  Blood pressure 163/98, pulse 73, temperature 98.2 F (36.8 C), temperature source Oral, resp. rate 20, SpO2 99.00%.  The patient is awake or and oriented. His no facial asymmetry. His gait is slow but nonantalgic. Reflexes are decreased but equal. He has some mild decreased extensor pollicis longus on the right and sensation is decreased on the dorsum of the right foot Assessment/Plan Impression is that of a herniated  disc L4-5 on the right. The plan is for a right L4-5 microdiscectomy.  Alejandro Meeker, MD 05/30/2012, 10:01 AM

## 2012-05-30 NOTE — Anesthesia Procedure Notes (Signed)
Date/Time: 05/30/2012 10:32 AM Performed by: Rogelia Boga Pre-anesthesia Checklist: Patient identified, Emergency Drugs available, Suction available, Patient being monitored and Timeout performed Patient Re-evaluated:Patient Re-evaluated prior to inductionOxygen Delivery Method: Circle system utilized Preoxygenation: Pre-oxygenation with 100% oxygen Intubation Type: IV induction Ventilation: Mask ventilation without difficulty and Oral airway inserted - appropriate to patient size Laryngoscope Size: Mac and 4 Grade View: Grade II Tube type: Oral Tube size: 7.5 mm Number of attempts: 1 Airway Equipment and Method: Stylet and LTA kit utilized Placement Confirmation: ETT inserted through vocal cords under direct vision,  positive ETCO2 and breath sounds checked- equal and bilateral Secured at: 22 cm Tube secured with: Tape Dental Injury: Teeth and Oropharynx as per pre-operative assessment

## 2012-05-30 NOTE — Preoperative (Signed)
Beta Blockers   Reason not to administer Beta Blockers:Not Applicable 

## 2012-05-30 NOTE — Transfer of Care (Signed)
Immediate Anesthesia Transfer of Care Note  Patient: Alejandro Schneider  Procedure(s) Performed: Procedure(s): LUMBAR LAMINECTOMY/DECOMPRESSION MICRODISCECTOMY 1 LEVEL (Right)  Patient Location: PACU  Anesthesia Type:General  Level of Consciousness: awake, alert , oriented and patient cooperative  Airway & Oxygen Therapy: Patient Spontanous Breathing and Patient connected to nasal cannula oxygen  Post-op Assessment: Report given to PACU RN, Post -op Vital signs reviewed and stable and Patient moving all extremities X 4  Post vital signs: Reviewed and stable  Complications: No apparent anesthesia complications

## 2012-05-30 NOTE — Discharge Summary (Signed)
  Physician Discharge Summary  Patient ID: Alejandro Schneider MRN: [id number] DOB/AGE: 10-13-1950 62 y.o.  Admit date: 05/30/2012 Discharge date: 05/30/2012  Admission Diagnoses:  Discharge Diagnoses:  Active Problems:   * No active hospital problems. *   Discharged Condition: good  Hospital Course: Surgery in morning, home in afternoon. Pain gone. Wound fine. Ambulated well. Home same day. Specific instructions given.  Consults: None  Significant Diagnostic Studies: none  Treatments: surgery: L 45 microdiscectomy  Discharge Exam: Blood pressure 139/53, pulse 92, temperature 97.9 F (36.6 C), temperature source Oral, resp. rate 20, SpO2 94.00%. Incision/Wound:clean and dry; neuro intact  Disposition: Final discharge disposition not confirmed  Discharge Orders   Future Appointments Provider Department Dept Phone   09/05/2012 8:45 AM Joaquim Nam, MD [facility] [phone]   Future Orders Complete By Expires     Call MD for:  difficulty breathing, headache or visual disturbances  As directed     Call MD for:  hives  As directed     Call MD for:  persistant nausea and vomiting  As directed     Call MD for:  redness, tenderness, or signs of infection (pain, swelling, redness, odor or green/yellow discharge around incision site)  As directed     Call MD for:  severe uncontrolled pain  As directed     Call MD for:  temperature >100.4  As directed     Diet general  As directed     Discharge instructions  As directed     Comments:      Mostly bedrest. Get up 9 or 10 times each day and walk for 15-20 minutes each time. Very little sitting the first week. No riding in the car until your first post op appointment. If you had neck surgery...may shower from the chest down. If you had low back surgery....you may shower with a saran wrap covering over the incision. Take your pain medicine as needed...and other medicines that you are instructed to take. Call for an  appointment...[phone].        Medication List    TAKE these medications       cyclobenzaprine 10 MG tablet  Commonly known as:  FLEXERIL  Take 10 mg by mouth 3 (three) times daily as needed for muscle spasms.     FISH OIL CONCENTRATE 1000 MG Caps  Take by mouth daily.     HYDROcodone-acetaminophen 10-325 MG per tablet  Commonly known as:  NORCO  Take 1 tablet by mouth every 6 (six) hours as needed for pain.     multivitamin,tx-minerals tablet  Take 1 tablet by mouth daily.         At home rest most of the time. Get up 9 or 10 times each day and take a 15 or 20 minute walk. No riding in the car and to your first postoperative appointment. If you have neck surgery you may shower from the chest down starting on the third postoperative day. If you had back surgery he may start showering on the third postoperative day with saran wrap wrapped around your incisional area 3 times. After the shower remove the saran wrap. Take pain medicine as needed and other medications as instructed. Call my office for an appointment.  Signed: Reinaldo Meeker, MD 05/30/2012, 4:36 PM

## 2012-05-30 NOTE — Op Note (Signed)
Preop diagnosis: Herniated disc L4-5 right with L5 nerve root compression Postop diagnosis: Same Procedure: Right L4-5 intralaminar laminotomy for excision of herniated disc with operating microscope Surgeon: Roshun Klingensmith Assistant: Phoebe Perch  After being placed the prone position the patient's back was prepped and draped in the usual sterile fashion. Localizing x-rays taken prior to incision to identify the appropriate level. Midline incision was made above the spinous processes of L4 and L5. Using Bovie cutting current the incision was carried on the spinous processes. Subperiosteal dissection was then carried out on the right side of the lamina and facet joint and self-retaining tract was placed for exposure. X-ray showed approach the appropriate level. Using the high-speed drill the inferior one third of the L4 lamina the medial the facet joint the superior one third of the L5 lamina were removed. Residual bone and ligamentum flavum removed in a piecemeal fashion. The microscope was draped brought into the field and used for the remainder of the case. Using microdissection technique the lateral aspect the thecal sac and L5 nerve root were identified. Further coagulation was carried out down before the canal to identify the L4-5 disc. Large amounts of free disc which were identified and removed to give excellent decompression of the thecal sac and nerve root. The disc space was then incised and thoroughly cleaned out with pituitary rongeurs and curettes. At this time inspection was carried out in all directions the evidence of residual compression and none could be identified. No additional free disc material was identified. The was irrigated copiously any bleeding control proper coagulation and Gelfoam. The was then closed in multiple layers of Vicryl on the muscle fascia subcutaneous and subcuticular tissues and Dermabond and Steri-Strips were placed on the skin. Shortness was then applied the patient was  extubated and taken to recovery room in stable condition.

## 2012-05-30 NOTE — Anesthesia Preprocedure Evaluation (Addendum)
Anesthesia Evaluation  Patient identified by MRN, date of birth, ID band Patient awake    Reviewed: Allergy & Precautions, H&P , NPO status , Patient's Chart, lab work & pertinent test results  Airway Mallampati: II TM Distance: >3 FB Neck ROM: full    Dental  (+) Dental Advisory Given   Pulmonary          Cardiovascular Rhythm:regular Rate:Normal     Neuro/Psych    GI/Hepatic   Endo/Other    Renal/GU      Musculoskeletal   Abdominal   Peds  Hematology   Anesthesia Other Findings   Reproductive/Obstetrics                          Anesthesia Physical Anesthesia Plan  ASA: I  Anesthesia Plan: General   Post-op Pain Management:    Induction: Intravenous  Airway Management Planned: Oral ETT  Additional Equipment:   Intra-op Plan:   Post-operative Plan: Extubation in OR  Informed Consent: I have reviewed the patients History and Physical, chart, labs and discussed the procedure including the risks, benefits and alternatives for the proposed anesthesia with the patient or authorized representative who has indicated his/her understanding and acceptance.     Plan Discussed with: CRNA, Anesthesiologist and Surgeon  Anesthesia Plan Comments:         Anesthesia Quick Evaluation

## 2012-05-30 NOTE — Progress Notes (Signed)
Pt given D/C instructions with verbal understanding. All patients questions were answered. Pt D/C'd home via wheelchair @ 1745 per MD order. Rema Fendt, RN

## 2012-06-01 ENCOUNTER — Encounter (HOSPITAL_COMMUNITY): Payer: Self-pay | Admitting: Neurosurgery

## 2012-08-29 ENCOUNTER — Encounter: Payer: 59 | Admitting: Family Medicine

## 2012-08-30 ENCOUNTER — Other Ambulatory Visit: Payer: Self-pay | Admitting: Family Medicine

## 2012-08-30 DIAGNOSIS — E7800 Pure hypercholesterolemia, unspecified: Secondary | ICD-10-CM

## 2012-08-30 DIAGNOSIS — Z125 Encounter for screening for malignant neoplasm of prostate: Secondary | ICD-10-CM

## 2012-08-31 ENCOUNTER — Other Ambulatory Visit (INDEPENDENT_AMBULATORY_CARE_PROVIDER_SITE_OTHER): Payer: 59

## 2012-08-31 DIAGNOSIS — Z125 Encounter for screening for malignant neoplasm of prostate: Secondary | ICD-10-CM

## 2012-08-31 DIAGNOSIS — E7800 Pure hypercholesterolemia, unspecified: Secondary | ICD-10-CM

## 2012-08-31 LAB — BASIC METABOLIC PANEL
BUN: 14 mg/dL (ref 6–23)
CO2: 26 mEq/L (ref 19–32)
Chloride: 106 mEq/L (ref 96–112)
Glucose, Bld: 100 mg/dL — ABNORMAL HIGH (ref 70–99)
Potassium: 4.1 mEq/L (ref 3.5–5.1)

## 2012-08-31 LAB — LIPID PANEL: Cholesterol: 188 mg/dL (ref 0–200)

## 2012-08-31 LAB — PSA: PSA: 1.92 ng/mL (ref 0.10–4.00)

## 2012-09-05 ENCOUNTER — Encounter: Payer: Self-pay | Admitting: Family Medicine

## 2012-09-05 ENCOUNTER — Ambulatory Visit (INDEPENDENT_AMBULATORY_CARE_PROVIDER_SITE_OTHER): Payer: 59 | Admitting: Family Medicine

## 2012-09-05 VITALS — BP 132/80 | HR 69 | Temp 97.4°F | Ht 70.0 in | Wt 197.2 lb

## 2012-09-05 DIAGNOSIS — Z0000 Encounter for general adult medical examination without abnormal findings: Secondary | ICD-10-CM

## 2012-09-05 DIAGNOSIS — D229 Melanocytic nevi, unspecified: Secondary | ICD-10-CM

## 2012-09-05 DIAGNOSIS — Z23 Encounter for immunization: Secondary | ICD-10-CM

## 2012-09-05 DIAGNOSIS — Z8042 Family history of malignant neoplasm of prostate: Secondary | ICD-10-CM

## 2012-09-05 NOTE — Progress Notes (Signed)
CPE- See plan.  Routine anticipatory guidance given to patient.  See health maintenance. Tetanus 2014 Flu done at work Shingles shot encouraged.  PNA not due.  Colonoscopy 2006 PSA wnl.  D/w pt.  No LUTS.   Living will d/w pt.  Wife would be designated if incapacitated.  Encouraged to talk with his wife about his preferences.   Diet and exercise d/w pt.  Exercise limited by back surgery; his back pain is resolved.  He has minimal weakness of dorsiflexion of the R 2nd and 3rd toe.  He's still working on exercises for this.   7mm nevus noted on midback, w/o known change per patient.  No change in size from prev exam.    PMH and SH reviewed  Meds, vitals, and allergies reviewed.   ROS: See HPI.  Otherwise negative.    GEN: nad, alert and oriented HEENT: mucous membranes moist NECK: supple w/o LA CV: rrr. PULM: ctab, no inc wob ABD: soft, +bs EXT: no edema SKIN: no acute rash but 7mm nevus noted on midback, w/o known change per patient.  No change in size from prev exam.   He has local irritation but no distant rash after a recent tick bite on abd- tick wasn't engorged per patient. No FB retained on exam.

## 2012-09-05 NOTE — Assessment & Plan Note (Signed)
Routine anticipatory guidance given to patient.  See health maintenance. Tetanus 2014 Flu done at work Shingles shot encouraged.  PNA not due.  Colonoscopy 2006 PSA wnl.  D/w pt.  No LUTS.   Living will d/w pt.  Wife would be designated if incapacitated.  Encouraged to talk with his wife about his preferences.   Diet and exercise d/w pt.  Exercise limited by back surgery; his back pain is resolved.  He has minimal weakness of dorsiflexion of the R 2nd and 3rd toe.  He's still working on exercises for this.

## 2012-09-05 NOTE — Assessment & Plan Note (Signed)
7mm nevus noted on midback, w/o known change per patient.  No change in size from prev exam.  Will follow clinically.  He'll notify me if changing.  Present for years w/o change per patient.

## 2012-09-05 NOTE — Patient Instructions (Addendum)
Take care.  Try to keep working on your diet.  I would get a flu shot each fall.   I'm glad your back is better.

## 2012-09-05 NOTE — Assessment & Plan Note (Signed)
PSA not elevated.  No LUTS.  DRE deferred.

## 2013-02-08 ENCOUNTER — Other Ambulatory Visit: Payer: Self-pay

## 2013-08-30 ENCOUNTER — Other Ambulatory Visit: Payer: Self-pay | Admitting: Family Medicine

## 2013-08-30 DIAGNOSIS — Z125 Encounter for screening for malignant neoplasm of prostate: Secondary | ICD-10-CM

## 2013-08-30 DIAGNOSIS — E7800 Pure hypercholesterolemia, unspecified: Secondary | ICD-10-CM

## 2013-09-03 ENCOUNTER — Other Ambulatory Visit (INDEPENDENT_AMBULATORY_CARE_PROVIDER_SITE_OTHER): Payer: 59

## 2013-09-03 DIAGNOSIS — Z125 Encounter for screening for malignant neoplasm of prostate: Secondary | ICD-10-CM

## 2013-09-03 DIAGNOSIS — E7800 Pure hypercholesterolemia, unspecified: Secondary | ICD-10-CM

## 2013-09-04 LAB — BASIC METABOLIC PANEL
BUN: 15 mg/dL (ref 6–23)
CHLORIDE: 106 meq/L (ref 96–112)
CO2: 26 meq/L (ref 19–32)
Calcium: 9.5 mg/dL (ref 8.4–10.5)
Creatinine, Ser: 0.9 mg/dL (ref 0.4–1.5)
GFR: 94.3 mL/min (ref >60.00)
GLUCOSE: 81 mg/dL (ref 70–99)
POTASSIUM: 4.4 meq/L (ref 3.5–5.1)
Sodium: 139 mEq/L (ref 135–145)

## 2013-09-04 LAB — LIPID PANEL
CHOL/HDL RATIO: 4
CHOLESTEROL: 199 mg/dL (ref 0–200)
HDL: 44.7 mg/dL (ref >39.00)
LDL Cholesterol: 137 mg/dL — ABNORMAL HIGH (ref 0–99)
TRIGLYCERIDES: 88 mg/dL (ref 0.0–149.0)
VLDL: 17.6 mg/dL (ref 0.0–40.0)

## 2013-09-04 LAB — PSA: PSA: 1.85 ng/mL (ref 0.10–4.00)

## 2013-09-06 ENCOUNTER — Encounter: Payer: Self-pay | Admitting: Family Medicine

## 2013-09-06 ENCOUNTER — Ambulatory Visit (INDEPENDENT_AMBULATORY_CARE_PROVIDER_SITE_OTHER): Payer: 59 | Admitting: Family Medicine

## 2013-09-06 VITALS — BP 142/88 | HR 63 | Temp 98.0°F | Ht 70.0 in | Wt 201.5 lb

## 2013-09-06 DIAGNOSIS — R238 Other skin changes: Secondary | ICD-10-CM | POA: Insufficient documentation

## 2013-09-06 DIAGNOSIS — Z0000 Encounter for general adult medical examination without abnormal findings: Secondary | ICD-10-CM

## 2013-09-06 DIAGNOSIS — Z2911 Encounter for prophylactic immunotherapy for respiratory syncytial virus (RSV): Secondary | ICD-10-CM

## 2013-09-06 DIAGNOSIS — M25519 Pain in unspecified shoulder: Secondary | ICD-10-CM

## 2013-09-06 DIAGNOSIS — Z23 Encounter for immunization: Secondary | ICD-10-CM

## 2013-09-06 DIAGNOSIS — L989 Disorder of the skin and subcutaneous tissue, unspecified: Secondary | ICD-10-CM

## 2013-09-06 MED ORDER — TRIAMCINOLONE ACETONIDE 0.1 % EX CREA: 1.0000 "application " | TOPICAL_CREAM | Freq: Two times a day (BID) | CUTANEOUS | Status: DC

## 2013-09-06 NOTE — Progress Notes (Signed)
Pre visit review using our clinic review tool, if applicable. No additional management support is needed unless otherwise documented below in the visit note.  CPE- See plan.  Routine anticipatory guidance given to patient.  See health maintenance. Tetanus 2014 Flu shot done at work Shingles shot due today.  PNA shot at 65.  Colonoscopy 2006 PSA wnl, FH noted.  Discussed.  Living will d/w pt.  Wife designated if patient were incapacitated.   Diet and exercise d/w pt.  Doing well.    "Pulled" his L arm, pain with abduction >90d with AROM, PROM is normal.  Heat helps.  Ibuprofen helps.  Getting better overall. No arm drop.  Happened about 3 weeks ago.    Insect bite on anterior inferior L shin.  Healing but itchy and he'll scratch it-->itch/scratch cycle--> not fully healed. Asking about options.   PMH and SH reviewed  Meds, vitals, and allergies reviewed.   ROS: See HPI.  Otherwise negative.    GEN: nad, alert and oriented HEENT: mucous membranes moist NECK: supple w/o LA CV: rrr. PULM: ctab, no inc wob ABD: soft, +bs EXT: no edema SKIN: no acute rash 2cm area on L anterior inferior shin, flaky but not warm or tender.   L shoulder with pain on ext>int rotation, pain with AROM >90 deg abduction. PROM abduction wnl.  Supraspinatus testing positive.  No arm drop.  Distally nv intact.

## 2013-09-06 NOTE — Assessment & Plan Note (Signed)
Use TAC and fu prn.

## 2013-09-06 NOTE — Assessment & Plan Note (Addendum)
Routine anticipatory guidance given to patient.  See health maintenance. Tetanus 2014 Flu shot done at work Shingles shot done today.  PNA shot at 65.  Colonoscopy 2006 PSA wnl, FH noted.  Discussed.  Living will d/w pt.  Wife designated if patient were incapacitated.   Diet and exercise d/w pt.  Doing well.

## 2013-09-06 NOTE — Assessment & Plan Note (Signed)
Likely cuff strain, d/w pt.  Handout given for exercises.  F/u prn.  He agrees. Already improving.

## 2013-09-06 NOTE — Patient Instructions (Signed)
Use the cream as needed and the shoulder exercises.  Both should get better.  Take care.  Glad to see you.

## 2014-01-28 ENCOUNTER — Ambulatory Visit (INDEPENDENT_AMBULATORY_CARE_PROVIDER_SITE_OTHER): Payer: 59 | Admitting: Family Medicine

## 2014-01-28 ENCOUNTER — Encounter: Payer: Self-pay | Admitting: Family Medicine

## 2014-01-28 VITALS — BP 118/80 | HR 64 | Temp 98.1°F | Wt 201.0 lb

## 2014-01-28 DIAGNOSIS — L821 Other seborrheic keratosis: Secondary | ICD-10-CM

## 2014-01-28 NOTE — Progress Notes (Signed)
Pre visit review using our clinic review tool, if applicable. No additional management support is needed unless otherwise documented below in the visit note.  Irritated lesion on the R side of scalp, at the hairline, gets caught on his hat.  No bleeding. Not ulcerated.  Wanted eval/tx.  Meds, vitals, and allergies reviewed.   ROS: See HPI.  Otherwise, noncontributory.  nad ncat 74mm SK on R side of scalp, irritated appearing

## 2014-01-28 NOTE — Patient Instructions (Signed)
Likely a seborrheic keratosis.  Keep it clean and it should either flake off or form a blister.  If it doesn't fully smooth out, then I can freeze it again later.  Take care.  Glad to see you.

## 2014-01-29 DIAGNOSIS — L821 Other seborrheic keratosis: Secondary | ICD-10-CM | POA: Insufficient documentation

## 2014-01-29 NOTE — Assessment & Plan Note (Signed)
Irritated, d/w pt about options.  With location and concern for healing, liq N2 preferred to shave.  D/w pt.  Verbal informed consent obtained.  He agrees.  Frozen x3 with liqN2, tolerated well, routine cautions given and f/u prn.  He is aware 1 day of tx may not eliminate the lesion. No complications, tolerated well.

## 2014-03-04 ENCOUNTER — Encounter: Payer: Self-pay | Admitting: Internal Medicine

## 2014-03-04 ENCOUNTER — Ambulatory Visit (INDEPENDENT_AMBULATORY_CARE_PROVIDER_SITE_OTHER): Payer: 59 | Admitting: Internal Medicine

## 2014-03-04 VITALS — BP 124/80 | HR 57 | Temp 97.6°F | Wt 206.5 lb

## 2014-03-04 DIAGNOSIS — B354 Tinea corporis: Secondary | ICD-10-CM

## 2014-03-04 MED ORDER — TERBINAFINE HCL 250 MG PO TABS: 250.0000 mg | ORAL_TABLET | Freq: Every day | ORAL | Status: DC

## 2014-03-04 NOTE — Patient Instructions (Signed)

## 2014-03-04 NOTE — Progress Notes (Signed)
Pre visit review using our clinic review tool, if applicable. No additional management support is needed unless otherwise documented below in the visit note. 

## 2014-03-04 NOTE — Progress Notes (Signed)
Subjective:    Patient ID: Alejandro Schneider, male    DOB: April 20, 1950, 63 y.o.   MRN: [id number]  HPI  Pt presents to the clinic today with c/o a rash on his arms and legs. He noticed this 2-3 weeks ago. He reports the rash looks like little red raised bumps. The rash is very itchy. He put some kenalog cream on the areas without much relief. He thinks it may be ringworm.  Review of Systems      Past Medical History  Diagnosis Date  . Hyperlipidemia     Current Outpatient Prescriptions  Medication Sig Dispense Refill  . Multiple Vitamins-Minerals (MULTIVITAMIN,TX-MINERALS) tablet Take 1 tablet by mouth daily.      . Omega-3 Fatty Acids (FISH OIL CONCENTRATE) 1000 MG CAPS Take by mouth daily.       No current facility-administered medications for this visit.    Allergies  Allergen Reactions  . Bee Venom     S/p allergy shots (and no reaction with stings after shots)    Family History  Problem Relation Age of Onset  . Heart disease Father     CABGx4  . Cancer Father     Prostate (Radiation)  . Prostate cancer Father   . Heart disease Paternal Grandfather     MI  . Hypertension Neg Hx   . Drug abuse Neg Hx   . Alcohol abuse Neg Hx   . Stroke Neg Hx   . Colon cancer Neg Hx   . Diabetes Cousin   . Depression Mother     History   Social History  . Marital Status: Married    Spouse Name: N/A    Number of Children: 1  . Years of Education: N/A   Occupational History  . Lineman [employer]    Social History Main Topics  . Smoking status: Never Smoker   . Smokeless tobacco: Never Used  . Alcohol Use: 0.0 oz/week     Comment: rarely  . Drug Use: No  . Sexual Activity: Yes   Other Topics Concern  . Not on file   Social History Narrative   Married 1974   1 kid, Adam   Armed forces logistics/support/administrative officer (In [place]: Denies fever, malaise, fatigue, headache or abrupt weight changes.   Skin: Pt reports rash on  arms and legs. Denies ulcercations.    No other specific complaints in a complete review of systems (except as listed in HPI above).  Objective:   Physical Exam   BP 124/80 mmHg  Pulse 57  Temp(Src) 97.6 F (36.4 C) (Oral)  Wt 206 lb 8 oz (93.668 kg)  SpO2 98% Wt Readings from Last 3 Encounters:  03/04/14 206 lb 8 oz (93.668 kg)  01/28/14 201 lb (91.173 kg)  09/06/13 201 lb 8 oz (91.4 kg)    General: Appears his stated age, well developed, well nourished in NAD. Skin: Multiple annular lesions with central clearing noted on lower legs, arms and back. The rash is scaly around the edges. Cardiovascular: Normal rate and rhythm. S1,S2 noted.  No murmur, rubs or gallops noted.  Pulmonary/Chest: Normal effort and positive vesicular breath sounds. No respiratory distress. No wheezes, rales or ronchi noted.    BMET    Component Value Date/Time   NA 139 09/03/2013 1647   K 4.4 09/03/2013 1647   CL 106 09/03/2013 1647   CO2 26 09/03/2013 1647   GLUCOSE  81 09/03/2013 1647   BUN 15 09/03/2013 1647   CREATININE 0.9 09/03/2013 1647   CALCIUM 9.5 09/03/2013 1647   GFRNONAA 91.94 07/21/2009 0833   GFRAA 99 07/10/2007 0942    Lipid Panel     Component Value Date/Time   CHOL 199 09/03/2013 1647   TRIG 88.0 09/03/2013 1647   HDL 44.70 09/03/2013 1647   CHOLHDL 4 09/03/2013 1647   VLDL 17.6 09/03/2013 1647   LDLCALC 137* 09/03/2013 1647    CBC    Component Value Date/Time   WBC 9.8 05/29/2012 1422   RBC 4.83 05/29/2012 1422   HGB 15.2 05/29/2012 1422   HCT 41.6 05/29/2012 1422   PLT 212 05/29/2012 1422   MCV 86.1 05/29/2012 1422   MCH 31.5 05/29/2012 1422   MCHC 36.5* 05/29/2012 1422   RDW 13.5 05/29/2012 1422   MONOABS 0.5 05/31/2006 1015   EOSABS 0.2 05/31/2006 1015   BASOSABS 0.0 05/31/2006 1015    Hgb A1C Lab Results  Component Value Date   HGBA1C 4.9 01/19/2005   HGBA1C 4.9 01/19/2005        Assessment & Plan:   Tinea Corporis:  Too widespread for  antifungal cream eRx for Terbinafine 250 mg PO tabs daily x 2 weeks CMET reviewed- discussed black box warning of liver failure- he understands and agrees to proceed  RTC as needed or if symptoms persist or worsen

## 2014-03-18 ENCOUNTER — Other Ambulatory Visit: Payer: Self-pay | Admitting: Internal Medicine

## 2014-03-18 ENCOUNTER — Other Ambulatory Visit: Payer: Self-pay

## 2014-03-18 DIAGNOSIS — B354 Tinea corporis: Secondary | ICD-10-CM

## 2014-03-18 DIAGNOSIS — Z5181 Encounter for therapeutic drug level monitoring: Secondary | ICD-10-CM

## 2014-03-18 NOTE — Telephone Encounter (Signed)
Pt left v/m; pt seen 03/04/14; pt has finished terbinafine and ring worm has partially cleared but not completely; pt request refill terbinafine or does pt need to be rechecked. [facility] [place]. Pt request cb.

## 2014-03-18 NOTE — Telephone Encounter (Signed)
Because that medication has a black box warning for liver failure, he needs to come in to repeat his LFT (lab only) if labs normal can refill terbinafine

## 2014-03-18 NOTE — Telephone Encounter (Signed)
Pt left v/m requesting status of refill.Please advise.

## 2014-03-19 ENCOUNTER — Other Ambulatory Visit (INDEPENDENT_AMBULATORY_CARE_PROVIDER_SITE_OTHER): Payer: 59

## 2014-03-19 DIAGNOSIS — Z5181 Encounter for therapeutic drug level monitoring: Secondary | ICD-10-CM

## 2014-03-19 LAB — COMPREHENSIVE METABOLIC PANEL
ALT: 28 U/L (ref 0–53)
AST: 27 U/L (ref 0–37)
Albumin: 4.4 g/dL (ref 3.5–5.2)
Alkaline Phosphatase: 65 U/L (ref 39–117)
BILIRUBIN TOTAL: 1 mg/dL (ref 0.2–1.2)
BUN: 16 mg/dL (ref 6–23)
CO2: 23 meq/L (ref 19–32)
Calcium: 8.9 mg/dL (ref 8.4–10.5)
Chloride: 106 mEq/L (ref 96–112)
Creatinine, Ser: 0.8 mg/dL (ref 0.4–1.5)
GFR: 100.79 mL/min (ref >60.00)
GLUCOSE: 93 mg/dL (ref 70–99)
Potassium: 3.7 mEq/L (ref 3.5–5.1)
SODIUM: 136 meq/L (ref 135–145)
TOTAL PROTEIN: 7.1 g/dL (ref 6.0–8.3)

## 2014-03-19 MED ORDER — TERBINAFINE HCL 250 MG PO TABS: 250.0000 mg | ORAL_TABLET | Freq: Every day | ORAL | Status: DC

## 2014-03-20 NOTE — Telephone Encounter (Signed)
I advised him via mychart that liver is fine and I have already sent in the lamisil to his pharmacy

## 2014-03-20 NOTE — Telephone Encounter (Signed)
Pt had CMP drawn and results is in chart--please advise

## 2014-04-09 ENCOUNTER — Encounter: Payer: Self-pay | Admitting: Family Medicine

## 2014-04-09 ENCOUNTER — Ambulatory Visit (INDEPENDENT_AMBULATORY_CARE_PROVIDER_SITE_OTHER): Payer: 59 | Admitting: Family Medicine

## 2014-04-09 VITALS — BP 148/84 | HR 81 | Temp 98.6°F | Wt 209.5 lb

## 2014-04-09 DIAGNOSIS — R21 Rash and other nonspecific skin eruption: Secondary | ICD-10-CM

## 2014-04-09 MED ORDER — CLOTRIMAZOLE-BETAMETHASONE 1-0.05 % EX CREA: 1.0000 "application " | TOPICAL_CREAM | Freq: Two times a day (BID) | CUTANEOUS | Status: DC

## 2014-04-09 NOTE — Patient Instructions (Signed)
Use the cream on the affected areas and update me if that isn't helping.  Take care.  Glad to see you.

## 2014-04-09 NOTE — Progress Notes (Signed)
Pre visit review using our clinic review tool, if applicable. No additional management support is needed unless otherwise documented below in the visit note.  Rash prev treated with oral lamisil for fungal infection on skin.  Improved some after 4 weeks.  He had LFT check, wnl 03/2014.  Arms are much better, some lesions remain but are not as red and not as itchy.  Leg sx bilaterally are more bothersome, still red and itchy.  Gold bond helps a little with the itching, but that is temporary.   No palmar lesions.  No oral lesions.  He doesn't feel poorly o/w.    Meds, vitals, and allergies reviewed.   ROS: See HPI.  Otherwise, noncontributory.  nad Minimal rash on the BUE ext but 6 lesions on the L lower leg noted, some up to ~3 cm wide with flaking skin, irritated red discoloration and well demarcated borders.  No ulceration.

## 2014-04-10 DIAGNOSIS — R21 Rash and other nonspecific skin eruption: Secondary | ICD-10-CM | POA: Insufficient documentation

## 2014-04-10 NOTE — Assessment & Plan Note (Signed)
He had a partial response to systemic antifungal with sx now mainly limited to the legs.  Okay to try topical tx.  Really bothered by the itching, so will try steroid/antifungal combination.  D/w pt.  He agrees.  AAA BID.  He'll update me as needed.

## 2014-05-20 ENCOUNTER — Emergency Department (HOSPITAL_COMMUNITY): Payer: 59

## 2014-05-20 ENCOUNTER — Inpatient Hospital Stay (HOSPITAL_COMMUNITY): Payer: 59

## 2014-05-20 ENCOUNTER — Encounter (HOSPITAL_COMMUNITY): Payer: Self-pay | Admitting: *Deleted

## 2014-05-20 ENCOUNTER — Inpatient Hospital Stay (HOSPITAL_COMMUNITY)
Admission: EM | Admit: 2014-05-20 | Discharge: 2014-05-23 | DRG: 066 | Disposition: A | Discharge status: Rehabilitation Facility | Payer: 59 | Attending: Neurology | Admitting: Neurology

## 2014-05-20 DIAGNOSIS — R066 Hiccough: Secondary | ICD-10-CM | POA: Diagnosis not present

## 2014-05-20 DIAGNOSIS — I63011 Cerebral infarction due to thrombosis of right vertebral artery: Secondary | ICD-10-CM | POA: Diagnosis present

## 2014-05-20 DIAGNOSIS — G463 Brain stem stroke syndrome: Secondary | ICD-10-CM | POA: Diagnosis present

## 2014-05-20 DIAGNOSIS — R42 Dizziness and giddiness: Secondary | ICD-10-CM

## 2014-05-20 DIAGNOSIS — E785 Hyperlipidemia, unspecified: Secondary | ICD-10-CM | POA: Diagnosis present

## 2014-05-20 DIAGNOSIS — E669 Obesity, unspecified: Secondary | ICD-10-CM | POA: Diagnosis present

## 2014-05-20 DIAGNOSIS — R262 Difficulty in walking, not elsewhere classified: Secondary | ICD-10-CM | POA: Diagnosis present

## 2014-05-20 DIAGNOSIS — I672 Cerebral atherosclerosis: Secondary | ICD-10-CM | POA: Diagnosis present

## 2014-05-20 DIAGNOSIS — I6350 Cerebral infarction due to unspecified occlusion or stenosis of unspecified cerebral artery: Secondary | ICD-10-CM

## 2014-05-20 DIAGNOSIS — I999 Unspecified disorder of circulatory system: Secondary | ICD-10-CM

## 2014-05-20 DIAGNOSIS — Z6829 Body mass index (BMI) 29.0-29.9, adult: Secondary | ICD-10-CM

## 2014-05-20 DIAGNOSIS — I69393 Ataxia following cerebral infarction: Secondary | ICD-10-CM | POA: Diagnosis not present

## 2014-05-20 DIAGNOSIS — I998 Other disorder of circulatory system: Secondary | ICD-10-CM

## 2014-05-20 DIAGNOSIS — I651 Occlusion and stenosis of basilar artery: Secondary | ICD-10-CM

## 2014-05-20 DIAGNOSIS — H5509 Other forms of nystagmus: Secondary | ICD-10-CM | POA: Diagnosis present

## 2014-05-20 DIAGNOSIS — Z8249 Family history of ischemic heart disease and other diseases of the circulatory system: Secondary | ICD-10-CM | POA: Diagnosis not present

## 2014-05-20 DIAGNOSIS — R278 Other lack of coordination: Secondary | ICD-10-CM | POA: Diagnosis present

## 2014-05-20 DIAGNOSIS — Z833 Family history of diabetes mellitus: Secondary | ICD-10-CM | POA: Diagnosis not present

## 2014-05-20 DIAGNOSIS — Z91030 Bee allergy status: Secondary | ICD-10-CM

## 2014-05-20 DIAGNOSIS — I639 Cerebral infarction, unspecified: Secondary | ICD-10-CM

## 2014-05-20 DIAGNOSIS — I10 Essential (primary) hypertension: Secondary | ICD-10-CM | POA: Diagnosis present

## 2014-05-20 DIAGNOSIS — I63012 Cerebral infarction due to thrombosis of left vertebral artery: Secondary | ICD-10-CM | POA: Diagnosis present

## 2014-05-20 LAB — COMPREHENSIVE METABOLIC PANEL
ALK PHOS: 62 U/L (ref 39–117)
ALT: 29 U/L (ref 0–53)
AST: 40 U/L — ABNORMAL HIGH (ref 0–37)
Albumin: 3.8 g/dL (ref 3.5–5.2)
Anion gap: 5 (ref 5–15)
BUN: 19 mg/dL (ref 6–23)
CO2: 26 mmol/L (ref 19–32)
CREATININE: 0.93 mg/dL (ref 0.50–1.35)
Calcium: 8.6 mg/dL (ref 8.4–10.5)
Chloride: 108 mmol/L (ref 96–112)
GFR calc non Af Amer: 88 mL/min — ABNORMAL LOW (ref >90)
GLUCOSE: 164 mg/dL — AB (ref 70–99)
POTASSIUM: 3.7 mmol/L (ref 3.5–5.1)
Sodium: 139 mmol/L (ref 135–145)
TOTAL PROTEIN: 6.6 g/dL (ref 6.0–8.3)
Total Bilirubin: 0.8 mg/dL (ref 0.3–1.2)

## 2014-05-20 LAB — I-STAT CHEM 8, ED
BUN: 21 mg/dL (ref 6–23)
CREATININE: 0.8 mg/dL (ref 0.50–1.35)
Calcium, Ion: 1.11 mmol/L — ABNORMAL LOW (ref 1.13–1.30)
Chloride: 105 mmol/L (ref 96–112)
Glucose, Bld: 161 mg/dL — ABNORMAL HIGH (ref 70–99)
HCT: 44 % (ref 39.0–52.0)
Hemoglobin: 15 g/dL (ref 13.0–17.0)
Potassium: 3.7 mmol/L (ref 3.5–5.1)
SODIUM: 141 mmol/L (ref 135–145)
TCO2: 19 mmol/L (ref 0–100)

## 2014-05-20 LAB — CBC
HCT: 41.5 % (ref 39.0–52.0)
HEMOGLOBIN: 14.3 g/dL (ref 13.0–17.0)
MCH: 31 pg (ref 26.0–34.0)
MCHC: 34.5 g/dL (ref 30.0–36.0)
MCV: 89.8 fL (ref 78.0–100.0)
Platelets: 169 10*3/uL (ref 150–400)
RBC: 4.62 MIL/uL (ref 4.22–5.81)
RDW: 13.2 % (ref 11.5–15.5)
WBC: 7.4 10*3/uL (ref 4.0–10.5)

## 2014-05-20 LAB — DIFFERENTIAL
BASOS PCT: 0 % (ref 0–1)
Basophils Absolute: 0 10*3/uL (ref 0.0–0.1)
Eosinophils Absolute: 0.3 10*3/uL (ref 0.0–0.7)
Eosinophils Relative: 4 % (ref 0–5)
LYMPHS PCT: 12 % (ref 12–46)
Lymphs Abs: 0.9 10*3/uL (ref 0.7–4.0)
MONO ABS: 0.6 10*3/uL (ref 0.1–1.0)
Monocytes Relative: 8 % (ref 3–12)
NEUTROS PCT: 76 % (ref 43–77)
Neutro Abs: 5.7 10*3/uL (ref 1.7–7.7)

## 2014-05-20 LAB — I-STAT TROPONIN, ED: TROPONIN I, POC: 0 ng/mL (ref 0.00–0.08)

## 2014-05-20 LAB — PROTIME-INR
INR: 0.99 (ref 0.00–1.49)
Prothrombin Time: 13.2 seconds (ref 11.6–15.2)

## 2014-05-20 LAB — HEPARIN LEVEL (UNFRACTIONATED): Heparin Unfractionated: <0.1 IU/mL — ABNORMAL LOW (ref 0.30–0.70)

## 2014-05-20 LAB — APTT: APTT: 28 s (ref 24–37)

## 2014-05-20 LAB — MRSA PCR SCREENING: MRSA BY PCR: NEGATIVE

## 2014-05-20 LAB — CBG MONITORING, ED: Glucose-Capillary: 150 mg/dL — ABNORMAL HIGH (ref 70–99)

## 2014-05-20 MED ORDER — ONDANSETRON HCL 4 MG/2ML IJ SOLN
4.0000 mg | Freq: Once | INTRAMUSCULAR | Status: AC
  Administered 2014-05-20: 4 mg via INTRAVENOUS
  Filled 2014-05-20: qty 2

## 2014-05-20 MED ORDER — ONDANSETRON HCL 4 MG/2ML IJ SOLN
INTRAMUSCULAR | Status: AC
  Filled 2014-05-20: qty 2

## 2014-05-20 MED ORDER — ASPIRIN 325 MG PO TABS
325.0000 mg | ORAL_TABLET | Freq: Every day | ORAL | Status: DC
  Administered 2014-05-21: 325 mg via ORAL
  Filled 2014-05-20: qty 1

## 2014-05-20 MED ORDER — STROKE: EARLY STAGES OF RECOVERY BOOK
Freq: Once | Status: DC
  Filled 2014-05-20: qty 1

## 2014-05-20 MED ORDER — ONDANSETRON HCL 4 MG/2ML IJ SOLN
4.0000 mg | Freq: Once | INTRAMUSCULAR | Status: AC
  Administered 2014-05-20: 4 mg via INTRAVENOUS

## 2014-05-20 MED ORDER — CLOPIDOGREL BISULFATE 75 MG PO TABS
300.0000 mg | ORAL_TABLET | Freq: Once | ORAL | Status: DC
  Filled 2014-05-20 (×2): qty 1

## 2014-05-20 MED ORDER — HEPARIN (PORCINE) IN NACL 100-0.45 UNIT/ML-% IJ SOLN
1500.0000 [IU]/h | INTRAMUSCULAR | Status: DC
  Administered 2014-05-20: 950 [IU]/h via INTRAVENOUS
  Filled 2014-05-20 (×2): qty 250

## 2014-05-20 MED ORDER — HEPARIN SODIUM (PORCINE) 1000 UNIT/ML IJ SOLN
INTRAMUSCULAR | Status: AC
  Filled 2014-05-20: qty 3

## 2014-05-20 MED ORDER — ASPIRIN 300 MG RE SUPP
300.0000 mg | Freq: Every day | RECTAL | Status: DC
  Filled 2014-05-20: qty 1

## 2014-05-20 MED ORDER — SODIUM CHLORIDE 0.9 % IV SOLN
INTRAVENOUS | Status: DC
  Administered 2014-05-20: 20:00:00 via INTRAVENOUS

## 2014-05-20 MED ORDER — IOHEXOL 300 MG/ML  SOLN
150.0000 mL | Freq: Once | INTRAMUSCULAR | Status: AC | PRN
  Administered 2014-05-20: 100 mL via INTRA_ARTERIAL

## 2014-05-20 MED ORDER — MIDAZOLAM HCL 2 MG/2ML IJ SOLN
INTRAMUSCULAR | Status: AC
  Filled 2014-05-20: qty 6

## 2014-05-20 MED ORDER — NITROGLYCERIN 1 MG/10 ML FOR IR/CATH LAB
INTRA_ARTERIAL | Status: AC
  Filled 2014-05-20: qty 10

## 2014-05-20 MED ORDER — CEFAZOLIN SODIUM-DEXTROSE 2-3 GM-% IV SOLR
2.0000 g | Freq: Once | INTRAVENOUS | Status: AC
  Administered 2014-05-20: 2 g via INTRAVENOUS
  Filled 2014-05-20 (×2): qty 50

## 2014-05-20 MED ORDER — FENTANYL CITRATE 0.05 MG/ML IJ SOLN
INTRAMUSCULAR | Status: AC | PRN
  Administered 2014-05-20: 25 ug via INTRAVENOUS

## 2014-05-20 MED ORDER — LIDOCAINE HCL 1 % IJ SOLN
INTRAMUSCULAR | Status: AC
  Filled 2014-05-20: qty 20

## 2014-05-20 MED ORDER — SODIUM CHLORIDE 0.9 % IV SOLN: Freq: Once | INTRAVENOUS | Status: DC

## 2014-05-20 MED ORDER — IOHEXOL 350 MG/ML SOLN
50.0000 mL | Freq: Once | INTRAVENOUS | Status: AC | PRN
  Administered 2014-05-20: 50 mL via INTRAVENOUS

## 2014-05-20 MED ORDER — FENTANYL CITRATE 0.05 MG/ML IJ SOLN
INTRAMUSCULAR | Status: AC
  Filled 2014-05-20: qty 6

## 2014-05-20 MED ORDER — HEPARIN SODIUM (PORCINE) 1000 UNIT/ML IJ SOLN
INTRAMUSCULAR | Status: AC | PRN
  Administered 2014-05-20: 1000 [IU] via INTRAVENOUS
  Administered 2014-05-20: 500 [IU] via INTRAVENOUS

## 2014-05-20 MED ORDER — HYDROMORPHONE HCL 1 MG/ML IJ SOLN
1.0000 mg | INTRAMUSCULAR | Status: DC | PRN
  Administered 2014-05-20 (×2): 1 mg via INTRAVENOUS
  Filled 2014-05-20 (×2): qty 1

## 2014-05-20 MED ORDER — MIDAZOLAM HCL 2 MG/2ML IJ SOLN
INTRAMUSCULAR | Status: AC | PRN
  Administered 2014-05-20: 0.5 mg via INTRAVENOUS

## 2014-05-20 MED ORDER — HYDROMORPHONE HCL 1 MG/ML IJ SOLN
2.0000 mg | Freq: Once | INTRAMUSCULAR | Status: DC
  Filled 2014-05-20: qty 2

## 2014-05-20 MED ORDER — ASPIRIN 325 MG PO TABS
325.0000 mg | ORAL_TABLET | Freq: Once | ORAL | Status: AC
  Administered 2014-05-20: 325 mg via ORAL
  Filled 2014-05-20: qty 1

## 2014-05-20 MED ORDER — SENNOSIDES-DOCUSATE SODIUM 8.6-50 MG PO TABS
1.0000 | ORAL_TABLET | Freq: Every evening | ORAL | Status: DC | PRN
  Filled 2014-05-20: qty 1

## 2014-05-20 NOTE — H&P (Signed)
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 Authorizing Provider  Multiple Vitamins-Minerals (MULTIVITAMIN,TX-MINERALS) tablet Take 1 tablet by mouth daily.     Yes Historical Provider, MD  clotrimazole-betamethasone (LOTRISONE) cream Apply 1 application topically 2 (two) times daily. Patient not taking: Reported on 05/20/2014 04/09/14   Tonia Ghent, MD     Vital Signs: BP 140/80 mmHg  Pulse 55  Temp(Src) 97.8 F (36.6 C) (Oral)  Resp 16  Ht 5\' 10"  (1.778 m)  Wt 94.802 kg (209 lb)  BMI 29.99 kg/m2  SpO2 99%  Physical Exam  Constitutional: He is oriented to person, place, and time.  Cardiovascular: Normal rate and regular rhythm.   No murmur heard. Pulmonary/Chest: Effort normal and breath sounds normal. He has no wheezes.  Abdominal: Soft. Bowel sounds are normal.  Musculoskeletal: Normal range of motion.  Rt groin NT; no bleeding no hematoma Rt foot 2+ pulses  Neurological: He is alert and oriented to person, place, and time.  Skin: Skin is warm and dry.  Psychiatric: He has a normal mood and affect. His behavior is normal. Judgment and thought content normal.    Imaging: Ct Angio Head W/cm &/or Wo Cm  05/20/2014   CLINICAL DATA:  Stroke, vertigo. Initial symptoms, follow-up evaluation.  EXAM: CT ANGIOGRAPHY HEAD AND NECK  TECHNIQUE: Multidetector CT imaging of the head and neck was performed using the standard protocol during bolus administration of intravenous contrast. Multiplanar CT image reconstructions and MIPs were obtained to evaluate the vascular anatomy. Carotid stenosis measurements (when applicable) are obtained utilizing  NASCET criteria, using the distal internal carotid diameter as the denominator.  CONTRAST:  53mL OMNIPAQUE IOHEXOL 350 MG/ML SOLN  COMPARISON:  MRI and MRA of the head May 20, 2014 at 4:08 a.m.  FINDINGS: CTA NECK  Normal appearance of the thoracic arch, normal branch pattern. The origins of the innominate, left Common carotid artery and subclavian artery are widely patent.  Bilateral Common carotid arteries are widely patent, coursing in a straight line fashion. Mild eccentric intimal thickening. Normal appearance of the carotid bifurcations without hemodynamically significant stenosis by NASCET criteria. Normal appearance of the included internal carotid arteries.  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 Slight reconstitution of the proximal LEFT V2 segment via muscular branches with thready irregular flow through V2 segment, occluded distally. Mild extrinsic compression of the RIGHT vertebral artery due to degenerative change of the spine. However, there is apparent superimposed irregular distal RIGHT V2 segment, normal appearance of the RIGHT V3 segment.  No pseudoaneurysm.  No contrast extravasation.  Soft tissues are unremarkable. No acute osseous process though bone windows have not been submitted.  CTA HEAD  Anterior circulation: Normal appearance of the cervical internal carotid arteries, petrous, cavernous and supra clinoid internal carotid arteries. Widely patent anterior communicating artery. Normal appearance of the anterior and middle cerebral arteries. 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 Moderate anterior and middle cerebral arteries luminal regularity of the mid to distal branches likely represents atherosclerosis.  Posterior circulation: Absent LEFT vertebral artery. Thready irregular RIGHT V4 segment. The RIGHT posterior inferior cerebellar artery appears occluded with a 1 cm the origin. Thready irregular appearance of  the RIGHT distal vertebral artery. No discernible contrast opacification the proximal basilar artery. The RIGHT the irregular flow the mid to distal basilar artery. Bilateral posterior communicating arteries are present in this may reflect retrograde flow. Less robust enhancement LEFT V2 and V3 segments, though appear patent.  No abnormal intracranial enhancement.  IMPRESSION: Occluded LEFT vertebral artery, with minimal reconstitution within the foraminal segments. Occlusion of the distal LEFT V3 segment through the intracranial level.  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 However, there is very regular distal RIGHT V4 segment and, apparent included RIGHT posterior inferior cerebellar artery.  High-grade stenosis versus occluded proximal basilar artery, small amount of contrast in the mid to distal basilar artery may represent retrograde flow, complete circle of Willis noted.  Moderately irregular intracranial vessels suggests atherosclerosis, less likely vasculopathy. Patent posterior cerebral arteries though, less robust flow related enhancement of the LEFT mid to distal PCA segments.   Electronically Signed   By: Elon Alas   On: 05/20/2014 05:46   Ct Head (brain) Wo Contrast  05/20/2014   CLINICAL DATA:  Left-sided numbness in the face.  EXAM: CT HEAD WITHOUT CONTRAST  TECHNIQUE: Contiguous axial images were obtained from the base of the skull through the vertex without intravenous contrast.  COMPARISON:  None.  FINDINGS: There is no intracranial hemorrhage or mass. There is no extra-axial fluid collection. There is no evidence of acute infarction, with the exception of an equivocal hyperdense right middle cerebral artery. The ventricles are midline and symmetric. Basal cisterns are patent. The brain is otherwise unremarkable in appearance.  Mild membrane thickening is incidentally noted in the ethmoid air cells and maxillary sinuses, with no significant acute bony abnormality.  IMPRESSION: Equivocal hyperdense right MCA. No intracranial hemorrhage. Otherwise normal brain.  Critical Value/emergent results were called by telephone at the time of interpretation on 05/20/2014 at 3:25 am to Dr. Quintella Reichert , who verbally acknowledged these results.   Electronically Signed   By: Andreas Newport M.D.   On: 05/20/2014 03:25   Ct Angio Neck W/cm &/or Wo/cm  05/20/2014   CLINICAL DATA:  Stroke, vertigo. Initial symptoms, follow-up evaluation.  EXAM: CT ANGIOGRAPHY HEAD AND NECK  TECHNIQUE: Multidetector CT imaging of the head and neck was performed using the standard protocol during bolus administration of intravenous contrast. Multiplanar CT image reconstructions and MIPs were obtained to evaluate the vascular anatomy. Carotid stenosis measurements (when applicable) are obtained utilizing NASCET criteria, using the distal internal carotid diameter as the denominator.  CONTRAST:  45mL OMNIPAQUE IOHEXOL 350 MG/ML SOLN  COMPARISON:  MRI and MRA of the head May 20, 2014 at 4:08 a.m.  FINDINGS: CTA NECK  Normal appearance of the thoracic arch, normal branch pattern. The origins of the innominate, left Common carotid artery and subclavian artery are widely patent.  Bilateral Common carotid arteries are widely patent, coursing in a straight line fashion. Mild eccentric intimal thickening. Normal appearance of the carotid bifurcations without hemodynamically significant stenosis by NASCET criteria. Normal appearance of the included internal carotid arteries.  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 Slight reconstitution of the proximal LEFT V2 segment via muscular branches with thready irregular flow through V2 segment, occluded distally. Mild extrinsic compression of the RIGHT vertebral artery due to degenerative change of the spine. However,  there is apparent superimposed irregular distal RIGHT V2 segment, normal appearance of the RIGHT  V3 segment.  No pseudoaneurysm.  No contrast extravasation.  Soft tissues are unremarkable. No acute osseous process though bone windows have not been submitted.  CTA HEAD  Anterior circulation: Normal appearance of the cervical internal carotid arteries, petrous, cavernous and supra clinoid internal carotid arteries. Widely patent anterior communicating artery. Normal appearance of the anterior and middle cerebral arteries. 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 Moderate anterior and middle cerebral arteries luminal regularity of the mid to distal branches likely represents atherosclerosis.  Posterior circulation: Absent LEFT vertebral artery. Thready irregular RIGHT V4 segment. The RIGHT posterior inferior cerebellar artery appears occluded with a 1 cm the origin. Thready irregular appearance of the RIGHT distal vertebral artery. No discernible contrast opacification the proximal basilar artery. The RIGHT the irregular flow the mid to distal basilar artery. Bilateral posterior communicating arteries are present in this may reflect retrograde flow. Less robust enhancement LEFT V2 and V3 segments, though appear patent.  No abnormal intracranial enhancement.  IMPRESSION: Occluded LEFT vertebral artery, with minimal reconstitution within the foraminal segments. Occlusion of the distal LEFT V3 segment through the intracranial level.  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 However, there is very regular distal RIGHT V4 segment and, apparent included RIGHT posterior inferior cerebellar artery.  High-grade stenosis versus occluded proximal basilar artery, small amount of contrast in the mid to distal  basilar artery may represent retrograde flow, complete circle of Willis noted.  Moderately irregular intracranial vessels suggests atherosclerosis, less likely vasculopathy. Patent posterior cerebral arteries though, less robust flow related enhancement of the LEFT mid to distal PCA segments.   Electronically Signed   By: Elon Alas   On: 05/20/2014 05:46   Mr Jodene Nam Head Wo Contrast  05/20/2014   CLINICAL DATA:  LEFT facial numbness, headache and difficulty walking beginning at 1 a.m. history of hyperlipidemia and lumbar laminectomy.  EXAM: MRI HEAD WITHOUT CONTRAST  MRA HEAD WITHOUT CONTRAST  TECHNIQUE: Multiplanar, multiecho pulse sequences of the brain and surrounding structures were obtained without intravenous contrast. Angiographic images of the head were obtained using MRA technique without contrast.  COMPARISON:  CT of the head May 20, 2014 at 3:18 hours  FINDINGS: MRI HEAD FINDINGS  No reduced diffusion to suggest acute ischemia. No susceptibility artifact to suggest hemorrhage. Ventricles and sulci are normal for patient's age. Tiny linear T2 hyperintensity, low T1 signal on the LEFT cerebellum. No midline shift, mass effect or mass lesions. A few tiny supratentorial white matter T2 hyperintensities, less than expected for age.  No abnormal extra-axial fluid collections. Loss of LEFT vertebral artery flow void on the coronal T2 an, axial T2 to/24. Moderate paranasal sinus mucosal thickening, atretic LEFT maxillary sinus most consistent with chronic sinusitis disc, no acute component. Mastoid air cells are well aerated. Ocular globes and orbital contents are unremarkable. No abnormal sellar expansion. No cerebellar tonsillar ectopia. No suspicious calvarial bone marrow signal.  MRA HEAD FINDINGS  Anterior circulation: Normal flow related enhancement of the included cervical, petrous, cavernous and supra clinoid internal carotid arteries. Patent anterior communicating artery. Normal flow related  enhancement of the anterior and middle cerebral arteries, including more distal segments.  No large vessel occlusion, high-grade stenosis, abnormal luminal irregularity. 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 However, tortuous LEFT carotid siphon.  Posterior circulation: Absent flow related enhancement LEFT vertebral artery. Normal  flow related enhancement of the RIGHT vertebral artery proximal V4 segment, however there is suspected slow flow versus occlusion of RIGHT posterior inferior cerebellar artery. High-grade stenosis of proximal basilar artery, with robust bilateral posterior communicating arteries present. Patent bilateral posterior cerebral arteries, however there is faint flow related enhancement LEFT P2/3 segments.  No large vessel occlusion, aneurysm.  IMPRESSION: MRI HEAD: No acute intracranial process ; normal noncontrast MRI of the brain for age.  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  MRA HEAD: 3 mm laterally directed outpouching of LEFT cavernous carotid artery segments, considering proximity to the supraclinoid artery, this is less likely a para ophthalmic aneurysm.  LEFT vertebral artery occlusion. In addition, thready irregular distal RIGHT vertebral artery and high-grade stenosis of the proximal basilar artery. 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  Constellation of findings would be better characterized on CT angiogram of the head and neck.   Electronically Signed   By: Elon Alas   On: 05/20/2014 04:46   Mr Brain Wo Contrast  05/20/2014   CLINICAL DATA:  LEFT facial numbness, headache and difficulty walking beginning at 1 a.m. history of hyperlipidemia and lumbar laminectomy.  EXAM: MRI HEAD WITHOUT CONTRAST  MRA HEAD WITHOUT CONTRAST  TECHNIQUE: Multiplanar, multiecho pulse sequences of the brain and surrounding structures were obtained without  intravenous contrast. Angiographic images of the head were obtained using MRA technique without contrast.  COMPARISON:  CT of the head May 20, 2014 at 3:18 hours  FINDINGS: MRI HEAD FINDINGS  No reduced diffusion to suggest acute ischemia. No susceptibility artifact to suggest hemorrhage. Ventricles and sulci are normal for patient's age. Tiny linear T2 hyperintensity, low T1 signal on the LEFT cerebellum. No midline shift, mass effect or mass lesions. A few tiny supratentorial white matter T2 hyperintensities, less than expected for age.  No abnormal extra-axial fluid collections. Loss of LEFT vertebral artery flow void on the coronal T2 an, axial T2 to/24. Moderate paranasal sinus mucosal thickening, atretic LEFT maxillary sinus most consistent with chronic sinusitis disc, no acute component. Mastoid air cells are well aerated. Ocular globes and orbital contents are unremarkable. No abnormal sellar expansion. No cerebellar tonsillar ectopia. No suspicious calvarial bone marrow signal.  MRA HEAD FINDINGS  Anterior circulation: Normal flow related enhancement of the included cervical, petrous, cavernous and supra clinoid internal carotid arteries. Patent anterior communicating artery. Normal flow related enhancement of the anterior and middle cerebral arteries, including more distal segments.  No large vessel occlusion, high-grade stenosis, abnormal luminal irregularity. 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 However, tortuous LEFT carotid siphon.  Posterior circulation: Absent flow related enhancement LEFT vertebral artery. Normal flow related enhancement of the RIGHT vertebral artery proximal V4 segment, however there is suspected slow flow versus occlusion of RIGHT posterior inferior cerebellar artery. High-grade stenosis of proximal basilar artery, with robust bilateral posterior communicating arteries present. Patent bilateral posterior cerebral arteries, however there is  faint flow related enhancement LEFT P2/3 segments.  No large vessel occlusion, aneurysm.  IMPRESSION: MRI HEAD: No acute intracranial process ; normal noncontrast MRI of the brain for age.  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  MRA HEAD: 3 mm laterally directed outpouching of LEFT cavernous carotid artery segments, considering proximity to the supraclinoid artery, this is less likely a para ophthalmic aneurysm.  LEFT vertebral artery occlusion. In addition, thready irregular distal RIGHT vertebral artery and high-grade stenosis of the  proximal basilar artery. 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  Constellation of findings would be better characterized on CT angiogram of the head and neck.   Electronically Signed   By: Elon Alas   On: 05/20/2014 04:46   Ir Angiogram Extremity Left  05/20/2014   CLINICAL DATA:  Diplopia. Dysarthria. Gait ataxia. Vertebrobasilar insufficiency.  EXAM: BILATERAL COMMON CAROTID AND INNOMINATE ANGIOGRAPHY AND BILATERAL VERTEBRAL ARTERY ANGIOGRAMS  PROCEDURE: Contrast: 76mL OMNIPAQUE IOHEXOL 300 MG/ML  SOLN  Anesthesia/Sedation:  Conscious sedation.  Medications: Versed  0.5 mg IV.  Fentanyl 25 mcg IV.  Following a full explanation of the procedure along with the potential associated complications, an informed witnessed consent was obtained.  The right groin was prepped and draped in the usual sterile fashion. Thereafter using modified Seldinger technique, transfemoral access into the right common femoral artery was obtained without difficulty. Over a 0.035 inch guidewire, a 5 French Pinnacle sheath was inserted. Through this, and also over 0.035 inch guidewire, a 5 French JB1 catheter was advanced to the aortic arch region and selectively positioned in the right common carotid artery, the right subclavian artery, the left common carotid artery and the left subclavian artery.   There were no acute complications. The patient tolerated the procedure well.  FINDINGS: The right vertebral artery origin is normal.  The vessel has mild tortuosity proximally. More distally the vessel is seen to opacify to the cranial skull base. In the region of the right posterior-inferior cerebellar artery there is a severe focal stenosis with antegrade flow noted distally to the level of the superior cerebral arteries. Poor filling of the basilar artery proximally is seen. Both anterior inferior cerebellar arteries are seen to opacify.  The right common carotid arteriogram demonstrates mild narrowing of the right external carotid artery just distal to its origin.  More distally the vessel and its branches, otherwise, opacify normally.  The right internal carotid artery at the bulb to the cranial skull base also opacifies normally.  The petrous, the cavernous and the supraclinoid segments are widely patent.  The right middle and the right anterior cerebral arteries opacify normally into the capillary and venous phases.  Opacification via the right posterior communicating artery of the right posterior cerebral artery and the left posterior cerebral artery is noted. Also demonstrated is retrograde opacification of the distal basilar artery to the level of the superior cerebellar arteries with antegrade unopacified blood also noted suggesting flow from more proximal vessel.  The left common carotid arteriogram demonstrates the left external carotid artery and its major branches to be widely patent.  The left internal carotid artery at the bulb to the cranial skull base opacifies normally.  The petrous, the cavernous and the supraclinoid segments are widely patent.  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  More distally there is  opacification of the left posterior communicating artery with subsequent opacification of the left posterior cerebral artery distribution. Again demonstrated is retrograde opacification of the distal basilar artery.  The left middle and the left anterior cerebral arteries opacify normally into the capillary and venous phases.  Transient opacification via the anterior communicating artery of the right anterior cerebral artery is noted.  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  The left vein of Labbe is seen to drain into the transverse sinus/sigmoid sinus junction with flow distally noted into the internal jugular vein.  The left subclavian arteriogram demonstrates non-visualization of the left vertebral artery and the neck and also at the cranial skull base.  The thyrocervical trunk branches are normal.  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 Antegrade flow is seen in the basilar artery to the level of the superior cerebellar arteries.  Both posterior communicating arteries open.   Electronically Signed   By: Luanne Bras M.D.   On: 05/20/2014 11:14   Ir Angio Intra Extracran Sel Com Carotid Innominate Bilat Mod Sed  05/20/2014   CLINICAL DATA:  Diplopia. Dysarthria. Gait ataxia. Vertebrobasilar insufficiency.  EXAM: BILATERAL COMMON CAROTID AND INNOMINATE ANGIOGRAPHY AND BILATERAL VERTEBRAL ARTERY ANGIOGRAMS  PROCEDURE: Contrast: 4mL OMNIPAQUE IOHEXOL 300 MG/ML  SOLN  Anesthesia/Sedation:  Conscious sedation.  Medications: Versed  0.5 mg IV.  Fentanyl 25 mcg IV.  Following a full explanation of the procedure along with the potential associated complications, an informed witnessed consent was obtained.  The right groin was prepped and draped in the usual  sterile fashion. Thereafter using modified Seldinger technique, transfemoral access into the right common femoral artery was obtained without difficulty. Over a 0.035 inch guidewire, a 5 French Pinnacle sheath was inserted. Through this, and also over 0.035 inch guidewire, a 5 French JB1 catheter was advanced to the aortic arch region and selectively positioned in the right common carotid artery, the right subclavian artery, the left common carotid artery and the left subclavian artery.  There were no acute complications. The patient tolerated the procedure well.  FINDINGS: The right vertebral artery origin is normal.  The vessel has mild tortuosity proximally. More distally the vessel is seen to opacify to the cranial skull base. In the region of the right posterior-inferior cerebellar artery there is a severe focal stenosis with antegrade flow noted distally to the level of the superior cerebral arteries. Poor filling of the basilar artery proximally is seen. Both anterior inferior cerebellar arteries are seen to opacify.  The right common carotid arteriogram demonstrates mild narrowing of the right external carotid artery just distal to its origin.  More distally the vessel and its branches, otherwise, opacify normally.  The right internal carotid artery at the bulb to the cranial skull base also opacifies normally.  The petrous, the cavernous and the supraclinoid segments are widely patent.  The right middle and the right anterior cerebral arteries opacify normally into the capillary and venous phases.  Opacification via the right posterior communicating artery of the right posterior cerebral artery and the left posterior cerebral artery is noted. Also demonstrated is retrograde opacification of the distal basilar artery to the level of the superior cerebellar arteries with antegrade unopacified blood also noted suggesting flow from more proximal vessel.  The left common carotid arteriogram demonstrates the left  external carotid artery and its major branches to be widely patent.  The left internal carotid artery at the bulb to the cranial skull base opacifies normally.  The petrous, the cavernous and the supraclinoid segments are widely patent.  Arising from the superior aspect of the cavernous segment just posterior to the posterior aspect of the supraclinoid right ICA above the level of the ophthalmic artery is a saccular aneurysm measuring approximately 3 mm x 2  mm.  More distally there is opacification of the left posterior communicating artery with subsequent opacification of the left posterior cerebral artery distribution. Again demonstrated is retrograde opacification of the distal basilar artery.  The left middle and the left anterior cerebral arteries opacify normally into the capillary and venous phases.  Transient opacification via the anterior communicating artery of the right anterior cerebral artery is noted.  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  The left vein of Labbe is seen to drain into the transverse sinus/sigmoid sinus junction with flow distally noted into the internal jugular vein.  The left subclavian arteriogram demonstrates non-visualization of the left vertebral artery and the neck and also at the cranial skull base.  The thyrocervical trunk branches are normal.  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 Antegrade flow is seen in the basilar artery to the level of the superior cerebellar arteries.  Both posterior communicating arteries open.   Electronically Signed   By: Luanne Bras M.D.   On: 05/20/2014 11:14   Ir Angio Vertebral Sel Subclavian Innominate Uni R Mod Sed  05/20/2014   CLINICAL DATA:  Diplopia. Dysarthria. Gait ataxia.  Vertebrobasilar insufficiency.  EXAM: BILATERAL COMMON CAROTID AND INNOMINATE ANGIOGRAPHY AND BILATERAL VERTEBRAL ARTERY ANGIOGRAMS  PROCEDURE: Contrast: 62mL OMNIPAQUE IOHEXOL 300 MG/ML  SOLN  Anesthesia/Sedation:  Conscious sedation.  Medications: Versed  0.5 mg IV.  Fentanyl 25 mcg IV.  Following a full explanation of the procedure along with the potential associated complications, an informed witnessed consent was obtained.  The right groin was prepped and draped in the usual sterile fashion. Thereafter using modified Seldinger technique, transfemoral access into the right common femoral artery was obtained without difficulty. Over a 0.035 inch guidewire, a 5 French Pinnacle sheath was inserted. Through this, and also over 0.035 inch guidewire, a 5 French JB1 catheter was advanced to the aortic arch region and selectively positioned in the right common carotid artery, the right subclavian artery, the left common carotid artery and the left subclavian artery.  There were no acute complications. The patient tolerated the procedure well.  FINDINGS: The right vertebral artery origin is normal.  The vessel has mild tortuosity proximally. More distally the vessel is seen to opacify to the cranial skull base. In the region of the right posterior-inferior cerebellar artery there is a severe focal stenosis with antegrade flow noted distally to the level of the superior cerebral arteries. Poor filling of the basilar artery proximally is seen. Both anterior inferior cerebellar arteries are seen to opacify.  The right common carotid arteriogram demonstrates mild narrowing of the right external carotid artery just distal to its origin.  More distally the vessel and its branches, otherwise, opacify normally.  The right internal carotid artery at the bulb to the cranial skull base also opacifies normally.  The petrous, the cavernous and the supraclinoid segments are widely patent.  The right middle and the right anterior  cerebral arteries opacify normally into the capillary and venous phases.  Opacification via the right posterior communicating artery of the right posterior cerebral artery and the left posterior cerebral artery is noted. Also demonstrated is retrograde opacification of the distal basilar artery to the level of the superior cerebellar arteries with antegrade unopacified blood also noted suggesting flow from more proximal vessel.  The left common  carotid arteriogram demonstrates the left external carotid artery and its major branches to be widely patent.  The left internal carotid artery at the bulb to the cranial skull base opacifies normally.  The petrous, the cavernous and the supraclinoid segments are widely patent.  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  More distally there is opacification of the left posterior communicating artery with subsequent opacification of the left posterior cerebral artery distribution. Again demonstrated is retrograde opacification of the distal basilar artery.  The left middle and the left anterior cerebral arteries opacify normally into the capillary and venous phases.  Transient opacification via the anterior communicating artery of the right anterior cerebral artery is noted.  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  The left vein of Labbe is seen to drain into the transverse sinus/sigmoid sinus junction with flow distally noted into the internal jugular vein.  The left subclavian arteriogram demonstrates non-visualization of the left vertebral artery and the neck and also at the cranial skull base.  The thyrocervical trunk branches are normal.  IMPRESSION: Occluded left  vertebral artery in its entirety.  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 Antegrade flow is seen in the basilar artery to the level of the superior cerebellar arteries.  Both posterior communicating arteries open.   Electronically Signed   By: Luanne Bras M.D.   On: 05/20/2014 11:14    Labs:  CBC:  Recent Labs  05/20/14 0306 05/20/14 0313  WBC 7.4  --   HGB 14.3 15.0  HCT 41.5 44.0  PLT 169  --     COAGS:  Recent Labs  05/20/14 0306  INR 0.99  APTT 28    BMP:  Recent Labs  09/03/13 1647 03/19/14 1218 05/20/14 0306 05/20/14 0313  NA 139 136 139 141  K 4.4 3.7 3.7 3.7  CL 106 106 108 105  CO2 26 23 26   --   GLUCOSE 81 93 164* 161*  BUN 15 16 19 21   CALCIUM 9.5 8.9 8.6  --   CREATININE 0.9 0.8 0.93 0.80  GFRNONAA  --   --  88*  --   GFRAA  --   --  >90  --     LIVER FUNCTION TESTS:  Recent Labs  03/19/14 1218 05/20/14 0306  BILITOT 1.0 0.8  AST 27 40*  ALT 28 29  ALKPHOS 65 62  PROT 7.1 6.6  ALBUMIN 4.4 3.8    Assessment and Plan: CVA Cerebral arteriogram 2/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

## 2014-05-20 NOTE — Progress Notes (Signed)
IR PA notified of groin site bleeding noted after arrival to unit. Manual pressure held then 10 lb sandbag applied to groin. Upon recheck, groin drainage decreased but still some oozing. Site soft, pedal pulses strong. Continuing sandbag and observation. No other orders received at this time.

## 2014-05-20 NOTE — ED Notes (Signed)
IR staff here to transport pt-- request no dilauadid due to sedation for test. Dilaudid not given.

## 2014-05-20 NOTE — ED Notes (Addendum)
Pt c/o severe headache on left side of face, after standing up to urinate and laying back down. Dr. Nicole Kindred notified.

## 2014-05-20 NOTE — ED Notes (Signed)
Interventional Radiology PA at bedside getting permit signed for procedure.

## 2014-05-20 NOTE — Progress Notes (Signed)
UR completed. Await therapy evals to determine needs for d/c.  Averey Trompeter, RN BSN [facility] CCM Trauma/Neuro ICU Case Manager [phone]  

## 2014-05-20 NOTE — Progress Notes (Signed)
ANTICOAGULATION CONSULT NOTE - Initial Consult  Pharmacy Consult for Heparin Indication: CVA  Allergies  Allergen Reactions  . Bee Venom     S/p allergy shots (and no reaction with stings after shots)    Patient Measurements: Height: 5\' 10"  (177.8 cm) Weight: 209 lb (94.802 kg) IBW/kg (Calculated) : 73 Heparin Dosing Weight: 93 kg  Vital Signs: Temp: 98.2 F (36.8 C) (02/15 1939) Temp Source: Oral (02/15 1939) BP: 154/77 mmHg (02/15 2000) Pulse Rate: 63 (02/15 2000)  Labs:  Recent Labs  05/20/14 0306 05/20/14 0313 05/20/14 2049  HGB 14.3 15.0  --   HCT 41.5 44.0  --   PLT 169  --   --   APTT 28  --   --   LABPROT 13.2  --   --   INR 0.99  --   --   HEPARINUNFRC  --   --  <0.10*  CREATININE 0.93 0.80  --     Estimated Creatinine Clearance: 109.2 mL/min (by C-G formula based on Cr of 0.8).   Medical History: Past Medical History  Diagnosis Date  . Hyperlipidemia     Medications:  Prescriptions prior to admission  Medication Sig Dispense Refill Last Dose  . Multiple Vitamins-Minerals (MULTIVITAMIN,TX-MINERALS) tablet Take 1 tablet by mouth daily.     05/19/2014 at Unknown time  . clotrimazole-betamethasone (LOTRISONE) cream Apply 1 application topically 2 (two) times daily. (Patient not taking: Reported on 05/20/2014) 30 g 1 Not Taking at Unknown time    Assessment: L-sided facial pain, numbness, dizziness, visual disturbances  64 y/o M with h/o HLD presents with above complaints. Abnormal MRI: occluded L vertebral artery, Right vertebrobasilar junction and proximal basilar artery stenosis. Cerebral arteriogram today 2/15.  New CVA s/p cerebral arteriogram 2/15. Note groin site bleeding post-op. Baseline CBC WNL.  Initial HL is undetectable on heparin 950 units/hr (~10 units/kg/hr). Nurse reports no bleeding issues, especially from groin, in several hours and no issues with the line. Will refrain from bolusing due to CVA.   Goal of Therapy:  HL  0.3-0.5 Monitor platelets by anticoagulation protocol: Yes   Plan:  Increase heparin to 1250 units/hr Check heparin level in 6 hrs. Daily heparin level and CBC Monitor s/sx of bleeding   Andrey Cota. Diona Foley, PharmD Clinical Pharmacist Pager [phone] 05/20/2014,9:35 PM

## 2014-05-20 NOTE — ED Provider Notes (Signed)
CSN: [id number]     Arrival date & time 05/20/14  0303 History  This chart was scribed for Quintella Reichert, MD by Chester Holstein, ED Scribe. This patient was seen in room D34C/D34C and the patient's care was started at 3:05 AM.    No chief complaint on file.   The history is provided by the patient and the EMS personnel. No language interpreter was used.    HPI Comments: HPI Comments: Alejandro Schneider is a 64 y.o. male brought in by ambulance, with h/o HLD and lumbar laminectomy who presents to the Emergency Department complaining of left facial numbness, headache, and difficulty walking with onset at 1 AM. Pt notes associated diaphoresis.  Pt last seen at baseline at 10:45 PM before bed. Pt A&Ox4. Pt denies fever, vomiting, chest pain, and abdominal pain. Symptoms are moderate and constant.   Past Medical History  Diagnosis Date  . Hyperlipidemia    Past Surgical History  Procedure Laterality Date  . Tonsillectomy  1972  . Inguinal hernia repair  10/16/02    Right  Dr Hassell Done  . Colonoscopy  2006  . Lumbar laminectomy/decompression microdiscectomy Right 05/30/2012    Procedure: LUMBAR LAMINECTOMY/DECOMPRESSION MICRODISCECTOMY 1 LEVEL;  Surgeon: Faythe Ghee, MD;  Location: [facility];  Service: Neurosurgery;  Laterality: Right;   Family History  Problem Relation Age of Onset  . Heart disease Father     CABGx4  . Cancer Father     Prostate (Radiation)  . Prostate cancer Father   . Heart disease Paternal Grandfather     MI  . Hypertension Neg Hx   . Drug abuse Neg Hx   . Alcohol abuse Neg Hx   . Stroke Neg Hx   . Colon cancer Neg Hx   . Diabetes Cousin   . Depression Mother    History  Substance Use Topics  . Smoking status: Never Smoker   . Smokeless tobacco: Never Used  . Alcohol Use: 0.0 oz/week     Comment: rarely    Review of Systems  Constitutional: Positive for diaphoresis. Negative for fever.  Cardiovascular: Negative for chest pain.  Gastrointestinal:  Negative for vomiting and abdominal pain.  Musculoskeletal: Positive for gait problem (difficulty walking).  Neurological: Positive for numbness (facial) and headaches.  All other systems reviewed and are negative.     Allergies  Bee venom  Home Medications   Prior to Admission medications   Medication Sig Start Date End Date Taking? Authorizing Provider  Multiple Vitamins-Minerals (MULTIVITAMIN,TX-MINERALS) tablet Take 1 tablet by mouth daily.     Yes Historical Provider, MD  clotrimazole-betamethasone (LOTRISONE) cream Apply 1 application topically 2 (two) times daily. Patient not taking: Reported on 05/20/2014 04/09/14   Tonia Ghent, MD   There were no vitals taken for this visit. Physical Exam  Constitutional: He is oriented to person, place, and time. He appears well-developed and well-nourished.  HENT:  Head: Normocephalic and atraumatic.  Cardiovascular: Normal rate and regular rhythm.   No murmur heard. Pulmonary/Chest: Effort normal and breath sounds normal. No respiratory distress.  Abdominal: Soft. There is no tenderness. There is no rebound and no guarding.  Musculoskeletal: He exhibits no edema or tenderness.  Neurological: He is alert and oriented to person, place, and time.  Slight decreased sensation over left lower face.  No asymmetry of facial muscles.  5/5 strength in all four extremities.    Skin: Skin is warm and dry.  Psychiatric: He has a normal mood and affect.  His behavior is normal.  Nursing note and vitals reviewed.   ED Course  Procedures (including critical care time) DIAGNOSTIC STUDIES:  COORDINATION OF CARE: 3:08 AM Discussed treatment plan with patient at beside, the patient agrees with the plan and has no further questions at this time.   Labs Review Labs Reviewed  COMPREHENSIVE METABOLIC PANEL - Abnormal; Notable for the following:    Glucose, Bld 164 (*)    AST 40 (*)    GFR calc non Af Amer 88 (*)    All other components within  normal limits  CBG MONITORING, ED - Abnormal; Notable for the following:    Glucose-Capillary 150 (*)    All other components within normal limits  I-STAT CHEM 8, ED - Abnormal; Notable for the following:    Glucose, Bld 161 (*)    Calcium, Ion 1.11 (*)    All other components within normal limits  PROTIME-INR  APTT  CBC  DIFFERENTIAL  I-STAT TROPOININ, ED    Imaging Review Ct Angio Head W/cm &/or Wo Cm  05/20/2014   CLINICAL DATA:  Stroke, vertigo. Initial symptoms, follow-up evaluation.  EXAM: CT ANGIOGRAPHY HEAD AND NECK  TECHNIQUE: Multidetector CT imaging of the head and neck was performed using the standard protocol during bolus administration of intravenous contrast. Multiplanar CT image reconstructions and MIPs were obtained to evaluate the vascular anatomy. Carotid stenosis measurements (when applicable) are obtained utilizing NASCET criteria, using the distal internal carotid diameter as the denominator.  CONTRAST:  66mL OMNIPAQUE IOHEXOL 350 MG/ML SOLN  COMPARISON:  MRI and MRA of the head May 20, 2014 at 4:08 a.m.  FINDINGS: CTA NECK  Normal appearance of the thoracic arch, normal branch pattern. The origins of the innominate, left Common carotid artery and subclavian artery are widely patent.  Bilateral Common carotid arteries are widely patent, coursing in a straight line fashion. Mild eccentric intimal thickening. Normal appearance of the carotid bifurcations without hemodynamically significant stenosis by NASCET criteria. Normal appearance of the included internal carotid arteries.  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 Slight reconstitution of the proximal LEFT V2 segment via muscular branches with thready irregular flow through V2 segment, occluded distally. Mild extrinsic compression of the RIGHT vertebral artery due to degenerative change of the spine. However, there is apparent superimposed irregular distal RIGHT V2 segment, normal appearance of the RIGHT V3  segment.  No pseudoaneurysm.  No contrast extravasation.  Soft tissues are unremarkable. No acute osseous process though bone windows have not been submitted.  CTA HEAD  Anterior circulation: Normal appearance of the cervical internal carotid arteries, petrous, cavernous and supra clinoid internal carotid arteries. Widely patent anterior communicating artery. Normal appearance of the anterior and middle cerebral arteries. 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 Moderate anterior and middle cerebral arteries luminal regularity of the mid to distal branches likely represents atherosclerosis.  Posterior circulation: Absent LEFT vertebral artery. Thready irregular RIGHT V4 segment. The RIGHT posterior inferior cerebellar artery appears occluded with a 1 cm the origin. Thready irregular appearance of the RIGHT distal vertebral artery. No discernible contrast opacification the proximal basilar artery. The RIGHT the irregular flow the mid to distal basilar artery. Bilateral posterior communicating arteries are present in this may reflect retrograde flow. Less robust enhancement LEFT V2 and V3 segments, though appear patent.  No abnormal intracranial enhancement.  IMPRESSION: Occluded LEFT vertebral artery, with minimal reconstitution within the foraminal segments. Occlusion of the distal LEFT V3 segment through  the intracranial level.  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 However, there is very regular distal RIGHT V4 segment and, apparent included RIGHT posterior inferior cerebellar artery.  High-grade stenosis versus occluded proximal basilar artery, small amount of contrast in the mid to distal basilar artery may represent retrograde flow, complete circle of Willis noted.  Moderately irregular  intracranial vessels suggests atherosclerosis, less likely vasculopathy. Patent posterior cerebral arteries though, less robust flow related enhancement of the LEFT mid to distal PCA segments.   Electronically Signed   By: Elon Alas   On: 05/20/2014 05:46   Ct Head (brain) Wo Contrast  05/20/2014   CLINICAL DATA:  Left-sided numbness in the face.  EXAM: CT HEAD WITHOUT CONTRAST  TECHNIQUE: Contiguous axial images were obtained from the base of the skull through the vertex without intravenous contrast.  COMPARISON:  None.  FINDINGS: There is no intracranial hemorrhage or mass. There is no extra-axial fluid collection. There is no evidence of acute infarction, with the exception of an equivocal hyperdense right middle cerebral artery. The ventricles are midline and symmetric. Basal cisterns are patent. The brain is otherwise unremarkable in appearance. Mild membrane thickening is incidentally noted in the ethmoid air cells and maxillary sinuses, with no significant acute bony abnormality.  IMPRESSION: Equivocal hyperdense right MCA. No intracranial hemorrhage. Otherwise normal brain.  Critical Value/emergent results were called by telephone at the time of interpretation on 05/20/2014 at 3:25 am to Dr. Quintella Reichert , who verbally acknowledged these results.   Electronically Signed   By: Andreas Newport M.D.   On: 05/20/2014 03:25   Ct Angio Neck W/cm &/or Wo/cm  05/20/2014   CLINICAL DATA:  Stroke, vertigo. Initial symptoms, follow-up evaluation.  EXAM: CT ANGIOGRAPHY HEAD AND NECK  TECHNIQUE: Multidetector CT imaging of the head and neck was performed using the standard protocol during bolus administration of intravenous contrast. Multiplanar CT image reconstructions and MIPs were obtained to evaluate the vascular anatomy. Carotid stenosis measurements (when applicable) are obtained utilizing NASCET criteria, using the distal internal carotid diameter as the denominator.  CONTRAST:  80mL OMNIPAQUE  IOHEXOL 350 MG/ML SOLN  COMPARISON:  MRI and MRA of the head May 20, 2014 at 4:08 a.m.  FINDINGS: CTA NECK  Normal appearance of the thoracic arch, normal branch pattern. The origins of the innominate, left Common carotid artery and subclavian artery are widely patent.  Bilateral Common carotid arteries are widely patent, coursing in a straight line fashion. Mild eccentric intimal thickening. Normal appearance of the carotid bifurcations without hemodynamically significant stenosis by NASCET criteria. Normal appearance of the included internal carotid arteries.  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 Slight reconstitution of the proximal LEFT V2 segment via muscular branches with thready irregular flow through V2 segment, occluded distally. Mild extrinsic compression of the RIGHT vertebral artery due to degenerative change of the spine. However, there is apparent superimposed irregular distal RIGHT V2 segment, normal appearance of the RIGHT V3 segment.  No pseudoaneurysm.  No contrast extravasation.  Soft tissues are unremarkable. No acute osseous process though bone windows have not been submitted.  CTA HEAD  Anterior circulation: Normal appearance of the cervical internal carotid arteries, petrous, cavernous and supra clinoid internal carotid arteries. Widely patent anterior communicating artery. Normal appearance of the anterior and middle cerebral arteries. 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  Moderate anterior and middle cerebral arteries luminal regularity of the mid to distal branches likely represents atherosclerosis.  Posterior circulation: Absent LEFT vertebral artery. Thready irregular RIGHT V4 segment. The RIGHT posterior inferior cerebellar artery appears occluded with a 1 cm the origin. Thready irregular appearance of the RIGHT distal vertebral artery. No discernible contrast opacification the proximal basilar artery. The  RIGHT the irregular flow the mid to distal basilar artery. Bilateral posterior communicating arteries are present in this may reflect retrograde flow. Less robust enhancement LEFT V2 and V3 segments, though appear patent.  No abnormal intracranial enhancement.  IMPRESSION: Occluded LEFT vertebral artery, with minimal reconstitution within the foraminal segments. Occlusion of the distal LEFT V3 segment through the intracranial level.  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 However, there is very regular distal RIGHT V4 segment and, apparent included RIGHT posterior inferior cerebellar artery.  High-grade stenosis versus occluded proximal basilar artery, small amount of contrast in the mid to distal basilar artery may represent retrograde flow, complete circle of Willis noted.  Moderately irregular intracranial vessels suggests atherosclerosis, less likely vasculopathy. Patent posterior cerebral arteries though, less robust flow related enhancement of the LEFT mid to distal PCA segments.   Electronically Signed   By: Elon Alas   On: 05/20/2014 05:46   Mr Jodene Nam Head Wo Contrast  05/20/2014   CLINICAL DATA:  LEFT facial numbness, headache and difficulty walking beginning at 1 a.m. history of hyperlipidemia and lumbar laminectomy.  EXAM: MRI HEAD WITHOUT CONTRAST  MRA HEAD WITHOUT CONTRAST  TECHNIQUE: Multiplanar, multiecho pulse sequences of the brain and surrounding structures were obtained without intravenous contrast. Angiographic images of the head were obtained using MRA technique without contrast.  COMPARISON:  CT of the head May 20, 2014 at 3:18 hours  FINDINGS: MRI HEAD FINDINGS  No reduced diffusion to suggest acute ischemia. No susceptibility artifact to suggest hemorrhage. Ventricles and sulci are normal for patient's age. Tiny linear T2  hyperintensity, low T1 signal on the LEFT cerebellum. No midline shift, mass effect or mass lesions. A few tiny supratentorial white matter T2 hyperintensities, less than expected for age.  No abnormal extra-axial fluid collections. Loss of LEFT vertebral artery flow void on the coronal T2 an, axial T2 to/24. Moderate paranasal sinus mucosal thickening, atretic LEFT maxillary sinus most consistent with chronic sinusitis disc, no acute component. Mastoid air cells are well aerated. Ocular globes and orbital contents are unremarkable. No abnormal sellar expansion. No cerebellar tonsillar ectopia. No suspicious calvarial bone marrow signal.  MRA HEAD FINDINGS  Anterior circulation: Normal flow related enhancement of the included cervical, petrous, cavernous and supra clinoid internal carotid arteries. Patent anterior communicating artery. Normal flow related enhancement of the anterior and middle cerebral arteries, including more distal segments.  No large vessel occlusion, high-grade stenosis, abnormal luminal irregularity. 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 However, tortuous LEFT carotid siphon.  Posterior circulation: Absent flow related enhancement LEFT vertebral artery. Normal flow related enhancement of the RIGHT vertebral artery proximal V4 segment, however there is suspected slow flow versus occlusion of RIGHT posterior inferior cerebellar artery. High-grade stenosis of proximal basilar artery, with robust bilateral posterior communicating arteries present. Patent bilateral posterior cerebral arteries, however there is faint flow related enhancement LEFT P2/3 segments.  No large vessel occlusion, aneurysm.  IMPRESSION: MRI HEAD: No acute intracranial process ; normal noncontrast MRI of the  brain for age.  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  MRA HEAD: 3 mm laterally directed outpouching of LEFT cavernous carotid artery segments,  considering proximity to the supraclinoid artery, this is less likely a para ophthalmic aneurysm.  LEFT vertebral artery occlusion. In addition, thready irregular distal RIGHT vertebral artery and high-grade stenosis of the proximal basilar artery. 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  Constellation of findings would be better characterized on CT angiogram of the head and neck.   Electronically Signed   By: Elon Alas   On: 05/20/2014 04:46   Mr Brain Wo Contrast  05/20/2014   CLINICAL DATA:  LEFT facial numbness, headache and difficulty walking beginning at 1 a.m. history of hyperlipidemia and lumbar laminectomy.  EXAM: MRI HEAD WITHOUT CONTRAST  MRA HEAD WITHOUT CONTRAST  TECHNIQUE: Multiplanar, multiecho pulse sequences of the brain and surrounding structures were obtained without intravenous contrast. Angiographic images of the head were obtained using MRA technique without contrast.  COMPARISON:  CT of the head May 20, 2014 at 3:18 hours  FINDINGS: MRI HEAD FINDINGS  No reduced diffusion to suggest acute ischemia. No susceptibility artifact to suggest hemorrhage. Ventricles and sulci are normal for patient's age. Tiny linear T2 hyperintensity, low T1 signal on the LEFT cerebellum. No midline shift, mass effect or mass lesions. A few tiny supratentorial white matter T2 hyperintensities, less than expected for age.  No abnormal extra-axial fluid collections. Loss of LEFT vertebral artery flow void on the coronal T2 an, axial T2 to/24. Moderate paranasal sinus mucosal thickening, atretic LEFT maxillary sinus most consistent with chronic sinusitis disc, no acute component. Mastoid air cells are well aerated. Ocular globes and orbital contents are unremarkable. No abnormal sellar expansion. No cerebellar tonsillar ectopia. No suspicious calvarial bone marrow signal.  MRA HEAD FINDINGS  Anterior circulation: Normal flow related  enhancement of the included cervical, petrous, cavernous and supra clinoid internal carotid arteries. Patent anterior communicating artery. Normal flow related enhancement of the anterior and middle cerebral arteries, including more distal segments.  No large vessel occlusion, high-grade stenosis, abnormal luminal irregularity. 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 However, tortuous LEFT carotid siphon.  Posterior circulation: Absent flow related enhancement LEFT vertebral artery. Normal flow related enhancement of the RIGHT vertebral artery proximal V4 segment, however there is suspected slow flow versus occlusion of RIGHT posterior inferior cerebellar artery. High-grade stenosis of proximal basilar artery, with robust bilateral posterior communicating arteries present. Patent bilateral posterior cerebral arteries, however there is faint flow related enhancement LEFT P2/3 segments.  No large vessel occlusion, aneurysm.  IMPRESSION: MRI HEAD: No acute intracranial process ; normal noncontrast MRI of the brain for age.  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  MRA HEAD: 3 mm laterally directed outpouching of LEFT cavernous carotid artery segments, considering proximity to the supraclinoid artery, this is less likely a para ophthalmic aneurysm.  LEFT vertebral artery occlusion. In addition, thready irregular distal RIGHT vertebral artery and high-grade stenosis of the proximal basilar artery. 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  Constellation of findings would be better characterized on CT angiogram of the head and neck.   Electronically Signed   By: Elon Alas   On: 05/20/2014 04:46     EKG Interpretation   Date/Time:  Monday May 20 2014 03:22:59 EST Ventricular Rate:  65 PR Interval:  140 QRS  Duration: 107 QT Interval:  444 QTC Calculation: 462 R Axis:    -31 Text Interpretation:  Sinus rhythm RSR' in V1 or V2, right VCD or RVH Left  ventricular hypertrophy Confirmed by Hazle Coca [phone]) on 05/20/2014  3:28:51 AM      MDM   Final diagnoses:  Insufficiency of posterior brain circulation     Patient here for evaluation of dizziness and gait ataxia. Code stroke activated on EMS arrival. Patient has been evaluated by neurology and myself. Patient is not a TPA candidate given his low NIH stroke score. Plan to admit to neurology for further management.    I personally performed the services described in this documentation, which was scribed in my presence. The recorded information has been reviewed and is accurate.     Quintella Reichert, MD 05/20/14 [phone]

## 2014-05-20 NOTE — Progress Notes (Signed)
ANTICOAGULATION CONSULT NOTE - Initial Consult  Pharmacy Consult for Heparin Indication: CVA  Allergies  Allergen Reactions  . Bee Venom     S/p allergy shots (and no reaction with stings after shots)    Patient Measurements: Height: 5\' 10"  (177.8 cm) Weight: 209 lb (94.802 kg) IBW/kg (Calculated) : 73 Heparin Dosing Weight: 93 kg  Vital Signs: Temp: 97.8 F (36.6 C) (02/15 1100) Temp Source: Oral (02/15 1100) BP: 135/58 mmHg (02/15 1315) Pulse Rate: 50 (02/15 1315)  Labs:  Recent Labs  05/20/14 0306 05/20/14 0313  HGB 14.3 15.0  HCT 41.5 44.0  PLT 169  --   APTT 28  --   LABPROT 13.2  --   INR 0.99  --   CREATININE 0.93 0.80    Estimated Creatinine Clearance: 109.2 mL/min (by C-G formula based on Cr of 0.8).   Medical History: Past Medical History  Diagnosis Date  . Hyperlipidemia     Medications:  Prescriptions prior to admission  Medication Sig Dispense Refill Last Dose  . Multiple Vitamins-Minerals (MULTIVITAMIN,TX-MINERALS) tablet Take 1 tablet by mouth daily.     05/19/2014 at Unknown time  . clotrimazole-betamethasone (LOTRISONE) cream Apply 1 application topically 2 (two) times daily. (Patient not taking: Reported on 05/20/2014) 30 g 1 Not Taking at Unknown time    Assessment: L-sided facial pain, numbness, dizziness, visual disturbances  64 y/o M with h/o HLD presents with above complaints. Abnormal MRI: occluded L vertebral artery, Right vertebrobasilar junction and proximal basilar artery stenosis. Cerebral arteriogram today 2/15.  Anticoagulation: New CVA s/p cerebral arteriogram 2/15. Note groin site bleeding post-op. Baseline CBC WNL.  Cardiovascular: h/o HLD. Start ASA325/d. BP ok but HR 50. F/u LDL level.   Goal of Therapy:  HL 0.3-0.5 Monitor platelets by anticoagulation protocol: Yes   Plan:  IV heparin (no bolus) at 950 units/hr Check heparin level in 6 hrs. Daily heparin level and CBC   Meika Earll S. Alford Highland, PharmD,  BCPS Clinical Staff Pharmacist Pager [phone]  Eilene Ghazi Stillinger 05/20/2014,1:48 PM

## 2014-05-20 NOTE — Progress Notes (Signed)
VASCULAR LAB PRELIMINARY  PRELIMINARY  PRELIMINARY  PRELIMINARY  Transcranial Doppler  Date POD PCO2 HCT BP  MCA ACA PCA OPHT SIPH VERT Basilar  05-20-14 VS     Right  Left   70  64   -84  -87   53  41   25  24   30  28    -42       -37         Right  Left                                            Right  Left                                             Right  Left                                             Right  Left                                            Right  Left                                            Right  Left                                        MCA = Middle Cerebral Artery      OPHT = Opthalmic Artery     BASILAR = Basilar Artery   ACA = Anterior Cerebral Artery     SIPH = Carotid Siphon PCA = Posterior Cerebral Artery   VERT = Verterbral Artery                   Normal MCA = 62+\-12 ACA = 50+\-12 PCA = 42+\-23   Unable to locate the left vertebral probably occluded      Burton Gahan, RVS 05/20/2014, 3:32 PM

## 2014-05-20 NOTE — Code Documentation (Signed)
Alejandro Schneider is a 64yo wm brought to [facility] by [facility] for onset pain along the left side of his nose, Lt lip numbness, ^ gait instability.  He states he got in bed around 2230 but did not go to sleep until 2330.  At 0100 he awoke with the pain along his nose and lip numbness.  The gait instability was discovered by EMS.  NIH 1 for sensory.

## 2014-05-20 NOTE — ED Notes (Signed)
Pt. Went to bed last night at 2245. At 1am pt. Got up to go to the bathroom and had no balance, numbness on left side of face and lip, with no other symptoms. Called EMS, Code stroke called on arrival

## 2014-05-20 NOTE — ED Notes (Signed)
CBG: 150 

## 2014-05-20 NOTE — Procedures (Signed)
S/P bilateral common carotid and RT vetebral artery  and Lt subclavian arteriograms. RT CFa approach. Findings . 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 2.Preocclusive  RT VBJ stenosis. 3.Bilateral PCOMS

## 2014-05-20 NOTE — Consult Note (Signed)
Chief Complaint: Chief Complaint  Patient presents with  . Code Stroke  Left sided facial p[ain and numbness Dizzy; visual disturbance  Referring Physician(s): Sethi  History of Present Illness: Alejandro Schneider is a 64 y.o. male  Pt noted new onset dizziness; blurred vision L facial pain and numbness L fingers tingling Started at 1230-100 am this am To ED Abnormal MRI---see reprt Scheduled now for cerebral arteriogram   Past Medical History  Diagnosis Date  . Hyperlipidemia     Past Surgical History  Procedure Laterality Date  . Tonsillectomy  1972  . Inguinal hernia repair  10/16/02    Right  Dr Hassell Done  . Colonoscopy  2006  . Lumbar laminectomy/decompression microdiscectomy Right 05/30/2012    Procedure: LUMBAR LAMINECTOMY/DECOMPRESSION MICRODISCECTOMY 1 LEVEL;  Surgeon: Faythe Ghee, MD;  Location: [facility];  Service: Neurosurgery;  Laterality: Right;    Allergies: Bee venom  Medications: Prior to Admission medications   Medication Sig Start Date End Date Taking? Authorizing Provider  Multiple Vitamins-Minerals (MULTIVITAMIN,TX-MINERALS) tablet Take 1 tablet by mouth daily.     Yes Historical Provider, MD  clotrimazole-betamethasone (LOTRISONE) cream Apply 1 application topically 2 (two) times daily. Patient not taking: Reported on 05/20/2014 04/09/14   Tonia Ghent, MD     Family History  Problem Relation Age of Onset  . Heart disease Father     CABGx4  . Cancer Father     Prostate (Radiation)  . Prostate cancer Father   . Heart disease Paternal Grandfather     MI  . Hypertension Neg Hx   . Drug abuse Neg Hx   . Alcohol abuse Neg Hx   . Stroke Neg Hx   . Colon cancer Neg Hx   . Diabetes Cousin   . Depression Mother     History   Social History  . Marital Status: Married    Spouse Name: N/A  . Number of Children: 1  . Years of Education: N/A   Occupational History  . Lineman [employer]    Social History Main Topics  . Smoking  status: Never Smoker   . Smokeless tobacco: Never Used  . Alcohol Use: 0.0 oz/week     Comment: rarely  . Drug Use: No  . Sexual Activity: Yes   Other Topics Concern  . 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  All other systems are negative.  Review of Systems  Constitutional: Positive for diaphoresis and activity change. Negative for fever and fatigue.  HENT: Negative for drooling, hearing loss, tinnitus, trouble swallowing and voice change.   Respiratory: Negative for chest tightness and shortness of breath.   Gastrointestinal: Positive for nausea. Negative for abdominal pain.  Genitourinary: Negative for difficulty urinating.  Musculoskeletal: Positive for gait problem. Negative for back pain.  Skin: Negative for color change.  Neurological: Positive for dizziness, weakness, light-headedness, numbness and headaches. Negative for tremors, seizures, syncope, facial asymmetry and speech difficulty.  Psychiatric/Behavioral: Negative for behavioral problems, confusion, decreased concentration and agitation.    Vital Signs: BP 149/89 mmHg  Pulse 66  Temp(Src) 97.6 F (36.4 C) (Oral)  Resp 18  Ht 5\' 10"  (1.778 m)  Wt 94.802 kg (209 lb)  BMI 29.99 kg/m2  SpO2  99%  Physical Exam  Constitutional: He is oriented to person, place, and time. He appears well-nourished.  Eyes: EOM are normal.  Neck: Normal range of motion.  Cardiovascular: Normal rate and regular rhythm.   No murmur heard. Pulmonary/Chest: Effort normal and breath sounds normal. He has no wheezes.  Abdominal: Soft. Bowel sounds are normal. There is no tenderness.  Musculoskeletal: Normal range of motion.  Left hand tingling Moves all 4s A/O appropriate  Neurological: He is alert and oriented to person, place, and  time.  Skin: Skin is warm and dry.  Psychiatric: He has a normal mood and affect. His behavior is normal. Judgment and thought content normal.    Mallampati Score:  MD Evaluation Airway: WNL Heart: WNL Abdomen: WNL ASA  Classification: 2 Mallampati/Airway Score: Two  Imaging: Ct Angio Head W/cm &/or Wo Cm  05/20/2014   CLINICAL DATA:  Stroke, vertigo. Initial symptoms, follow-up evaluation.  EXAM: CT ANGIOGRAPHY HEAD AND NECK  TECHNIQUE: Multidetector CT imaging of the head and neck was performed using the standard protocol during bolus administration of intravenous contrast. Multiplanar CT image reconstructions and MIPs were obtained to evaluate the vascular anatomy. Carotid stenosis measurements (when applicable) are obtained utilizing NASCET criteria, using the distal internal carotid diameter as the denominator.  CONTRAST:  60mL OMNIPAQUE IOHEXOL 350 MG/ML SOLN  COMPARISON:  MRI and MRA of the head May 20, 2014 at 4:08 a.m.  FINDINGS: CTA NECK  Normal appearance of the thoracic arch, normal branch pattern. The origins of the innominate, left Common carotid artery and subclavian artery are widely patent.  Bilateral Common carotid arteries are widely patent, coursing in a straight line fashion. Mild eccentric intimal thickening. Normal appearance of the carotid bifurcations without hemodynamically significant stenosis by NASCET criteria. Normal appearance of the included internal carotid arteries.  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 Slight reconstitution of the proximal LEFT V2 segment via muscular branches with thready irregular flow through V2 segment, occluded distally. Mild extrinsic compression of the RIGHT vertebral artery due to degenerative change of the spine. However, there is apparent superimposed irregular distal RIGHT V2 segment, normal appearance of the RIGHT V3 segment.  No pseudoaneurysm.  No contrast extravasation.  Soft tissues are unremarkable. No acute osseous  process though bone windows have not been submitted.  CTA HEAD  Anterior circulation: Normal appearance of the cervical internal carotid arteries, petrous, cavernous and supra clinoid internal carotid arteries. Widely patent anterior communicating artery. Normal appearance of the anterior and middle cerebral arteries. 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 Moderate anterior and middle cerebral arteries luminal regularity of the mid to distal branches likely represents atherosclerosis.  Posterior circulation: Absent LEFT vertebral artery. Thready irregular RIGHT V4 segment. The RIGHT posterior inferior cerebellar artery appears occluded with a 1 cm the origin. Thready irregular appearance of the RIGHT distal vertebral artery. No discernible contrast opacification the proximal basilar artery. The RIGHT the irregular flow the mid to distal basilar artery. Bilateral posterior communicating arteries are present in this may reflect retrograde flow. Less robust enhancement LEFT V2 and V3 segments, though appear patent.  No abnormal intracranial enhancement.  IMPRESSION: Occluded LEFT vertebral artery, with minimal reconstitution within the foraminal segments. Occlusion of the distal LEFT V3 segment through the intracranial level.  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 However, there is very regular distal  RIGHT V4 segment and, apparent included RIGHT posterior inferior cerebellar artery.  High-grade stenosis versus occluded proximal basilar artery, small amount of contrast in the mid to distal basilar artery may represent retrograde flow, complete circle of Willis noted.  Moderately irregular intracranial vessels suggests atherosclerosis, less likely vasculopathy. Patent posterior cerebral arteries  though, less robust flow related enhancement of the LEFT mid to distal PCA segments.   Electronically Signed   By: Elon Alas   On: 05/20/2014 05:46   Ct Head (brain) Wo Contrast  05/20/2014   CLINICAL DATA:  Left-sided numbness in the face.  EXAM: CT HEAD WITHOUT CONTRAST  TECHNIQUE: Contiguous axial images were obtained from the base of the skull through the vertex without intravenous contrast.  COMPARISON:  None.  FINDINGS: There is no intracranial hemorrhage or mass. There is no extra-axial fluid collection. There is no evidence of acute infarction, with the exception of an equivocal hyperdense right middle cerebral artery. The ventricles are midline and symmetric. Basal cisterns are patent. The brain is otherwise unremarkable in appearance. Mild membrane thickening is incidentally noted in the ethmoid air cells and maxillary sinuses, with no significant acute bony abnormality.  IMPRESSION: Equivocal hyperdense right MCA. No intracranial hemorrhage. Otherwise normal brain.  Critical Value/emergent results were called by telephone at the time of interpretation on 05/20/2014 at 3:25 am to Dr. Quintella Reichert , who verbally acknowledged these results.   Electronically Signed   By: Andreas Newport M.D.   On: 05/20/2014 03:25   Ct Angio Neck W/cm &/or Wo/cm  05/20/2014   CLINICAL DATA:  Stroke, vertigo. Initial symptoms, follow-up evaluation.  EXAM: CT ANGIOGRAPHY HEAD AND NECK  TECHNIQUE: Multidetector CT imaging of the head and neck was performed using the standard protocol during bolus administration of intravenous contrast. Multiplanar CT image reconstructions and MIPs were obtained to evaluate the vascular anatomy. Carotid stenosis measurements (when applicable) are obtained utilizing NASCET criteria, using the distal internal carotid diameter as the denominator.  CONTRAST:  55mL OMNIPAQUE IOHEXOL 350 MG/ML SOLN  COMPARISON:  MRI and MRA of the head May 20, 2014 at 4:08 a.m.  FINDINGS: CTA NECK   Normal appearance of the thoracic arch, normal branch pattern. The origins of the innominate, left Common carotid artery and subclavian artery are widely patent.  Bilateral Common carotid arteries are widely patent, coursing in a straight line fashion. Mild eccentric intimal thickening. Normal appearance of the carotid bifurcations without hemodynamically significant stenosis by NASCET criteria. Normal appearance of the included internal carotid arteries.  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 Slight reconstitution of the proximal LEFT V2 segment via muscular branches with thready irregular flow through V2 segment, occluded distally. Mild extrinsic compression of the RIGHT vertebral artery due to degenerative change of the spine. However, there is apparent superimposed irregular distal RIGHT V2 segment, normal appearance of the RIGHT V3 segment.  No pseudoaneurysm.  No contrast extravasation.  Soft tissues are unremarkable. No acute osseous process though bone windows have not been submitted.  CTA HEAD  Anterior circulation: Normal appearance of the cervical internal carotid arteries, petrous, cavernous and supra clinoid internal carotid arteries. Widely patent anterior communicating artery. Normal appearance of the anterior and middle cerebral arteries. 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 Moderate anterior and middle cerebral arteries luminal regularity of the mid to distal branches likely represents atherosclerosis.  Posterior circulation: Absent LEFT vertebral artery. Thready irregular RIGHT V4 segment. The RIGHT posterior inferior cerebellar artery appears  occluded with a 1 cm the origin. Thready irregular appearance of the RIGHT distal vertebral artery. No discernible contrast opacification the proximal basilar artery. The RIGHT the irregular flow the mid to distal basilar artery. Bilateral posterior communicating arteries are  present in this may reflect retrograde flow. Less robust enhancement LEFT V2 and V3 segments, though appear patent.  No abnormal intracranial enhancement.  IMPRESSION: Occluded LEFT vertebral artery, with minimal reconstitution within the foraminal segments. Occlusion of the distal LEFT V3 segment through the intracranial level.  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 However, there is very regular distal RIGHT V4 segment and, apparent included RIGHT posterior inferior cerebellar artery.  High-grade stenosis versus occluded proximal basilar artery, small amount of contrast in the mid to distal basilar artery may represent retrograde flow, complete circle of Willis noted.  Moderately irregular intracranial vessels suggests atherosclerosis, less likely vasculopathy. Patent posterior cerebral arteries though, less robust flow related enhancement of the LEFT mid to distal PCA segments.   Electronically Signed   By: Elon Alas   On: 05/20/2014 05:46   Mr Jodene Nam Head Wo Contrast  05/20/2014   CLINICAL DATA:  LEFT facial numbness, headache and difficulty walking beginning at 1 a.m. history of hyperlipidemia and lumbar laminectomy.  EXAM: MRI HEAD WITHOUT CONTRAST  MRA HEAD WITHOUT CONTRAST  TECHNIQUE: Multiplanar, multiecho pulse sequences of the brain and surrounding structures were obtained without intravenous contrast. Angiographic images of the head were obtained using MRA technique without contrast.  COMPARISON:  CT of the head May 20, 2014 at 3:18 hours  FINDINGS: MRI HEAD FINDINGS  No reduced diffusion to suggest acute ischemia. No susceptibility artifact to suggest hemorrhage. Ventricles and sulci are normal for patient's age. Tiny linear T2 hyperintensity, low T1 signal on the LEFT cerebellum. No midline shift, mass effect or mass lesions. A  few tiny supratentorial white matter T2 hyperintensities, less than expected for age.  No abnormal extra-axial fluid collections. Loss of LEFT vertebral artery flow void on the coronal T2 an, axial T2 to/24. Moderate paranasal sinus mucosal thickening, atretic LEFT maxillary sinus most consistent with chronic sinusitis disc, no acute component. Mastoid air cells are well aerated. Ocular globes and orbital contents are unremarkable. No abnormal sellar expansion. No cerebellar tonsillar ectopia. No suspicious calvarial bone marrow signal.  MRA HEAD FINDINGS  Anterior circulation: Normal flow related enhancement of the included cervical, petrous, cavernous and supra clinoid internal carotid arteries. Patent anterior communicating artery. Normal flow related enhancement of the anterior and middle cerebral arteries, including more distal segments.  No large vessel occlusion, high-grade stenosis, abnormal luminal irregularity. 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 However, tortuous LEFT carotid siphon.  Posterior circulation: Absent flow related enhancement LEFT vertebral artery. Normal flow related enhancement of the RIGHT vertebral artery proximal V4 segment, however there is suspected slow flow versus occlusion of RIGHT posterior inferior cerebellar artery. High-grade stenosis of proximal basilar artery, with robust bilateral posterior communicating arteries present. Patent bilateral posterior cerebral arteries, however there is faint flow related enhancement LEFT P2/3 segments.  No large vessel occlusion, aneurysm.  IMPRESSION: MRI HEAD: No acute intracranial process ; normal noncontrast MRI of the brain for age.  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  MRA HEAD: 3 mm laterally directed outpouching of LEFT cavernous carotid artery segments, considering proximity to the supraclinoid artery, this is less  likely a para ophthalmic aneurysm.  LEFT  vertebral artery occlusion. In addition, thready irregular distal RIGHT vertebral artery and high-grade stenosis of the proximal basilar artery. 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  Constellation of findings would be better characterized on CT angiogram of the head and neck.   Electronically Signed   By: Elon Alas   On: 05/20/2014 04:46   Mr Brain Wo Contrast  05/20/2014   CLINICAL DATA:  LEFT facial numbness, headache and difficulty walking beginning at 1 a.m. history of hyperlipidemia and lumbar laminectomy.  EXAM: MRI HEAD WITHOUT CONTRAST  MRA HEAD WITHOUT CONTRAST  TECHNIQUE: Multiplanar, multiecho pulse sequences of the brain and surrounding structures were obtained without intravenous contrast. Angiographic images of the head were obtained using MRA technique without contrast.  COMPARISON:  CT of the head May 20, 2014 at 3:18 hours  FINDINGS: MRI HEAD FINDINGS  No reduced diffusion to suggest acute ischemia. No susceptibility artifact to suggest hemorrhage. Ventricles and sulci are normal for patient's age. Tiny linear T2 hyperintensity, low T1 signal on the LEFT cerebellum. No midline shift, mass effect or mass lesions. A few tiny supratentorial white matter T2 hyperintensities, less than expected for age.  No abnormal extra-axial fluid collections. Loss of LEFT vertebral artery flow void on the coronal T2 an, axial T2 to/24. Moderate paranasal sinus mucosal thickening, atretic LEFT maxillary sinus most consistent with chronic sinusitis disc, no acute component. Mastoid air cells are well aerated. Ocular globes and orbital contents are unremarkable. No abnormal sellar expansion. No cerebellar tonsillar ectopia. No suspicious calvarial bone marrow signal.  MRA HEAD FINDINGS  Anterior circulation: Normal flow related enhancement of the included cervical, petrous, cavernous and supra clinoid internal carotid arteries. Patent  anterior communicating artery. Normal flow related enhancement of the anterior and middle cerebral arteries, including more distal segments.  No large vessel occlusion, high-grade stenosis, abnormal luminal irregularity. 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 However, tortuous LEFT carotid siphon.  Posterior circulation: Absent flow related enhancement LEFT vertebral artery. Normal flow related enhancement of the RIGHT vertebral artery proximal V4 segment, however there is suspected slow flow versus occlusion of RIGHT posterior inferior cerebellar artery. High-grade stenosis of proximal basilar artery, with robust bilateral posterior communicating arteries present. Patent bilateral posterior cerebral arteries, however there is faint flow related enhancement LEFT P2/3 segments.  No large vessel occlusion, aneurysm.  IMPRESSION: MRI HEAD: No acute intracranial process ; normal noncontrast MRI of the brain for age.  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  MRA HEAD: 3 mm laterally directed outpouching of LEFT cavernous carotid artery segments, considering proximity to the supraclinoid artery, this is less likely a para ophthalmic aneurysm.  LEFT vertebral artery occlusion. In addition, thready irregular distal RIGHT vertebral artery and high-grade stenosis of the proximal basilar artery. 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  Constellation of findings would be better characterized on CT angiogram of the head and neck.   Electronically Signed   By: Elon Alas   On: 05/20/2014 04:46    Labs:  CBC:  Recent Labs  05/20/14 0306 05/20/14 0313  WBC 7.4  --   HGB 14.3 15.0  HCT 41.5 44.0  PLT 169  --     COAGS:  Recent Labs  05/20/14 0306  INR 0.99  APTT 28    BMP:  Recent Labs  09/03/13 1647 03/19/14  1218 05/20/14 0306 05/20/14 0313  NA 139 136 139 141  K  4.4 3.7 3.7 3.7  CL 106 106 108 105  CO2 26 23 26   --   GLUCOSE 81 93 164* 161*  BUN 15 16 19 21   CALCIUM 9.5 8.9 8.6  --   CREATININE 0.9 0.8 0.93 0.80  GFRNONAA  --   --  88*  --   GFRAA  --   --  >90  --     LIVER FUNCTION TESTS:  Recent Labs  03/19/14 1218 05/20/14 0306  BILITOT 1.0 0.8  AST 27 40*  ALT 28 29  ALKPHOS 65 62  PROT 7.1 6.6  ALBUMIN 4.4 3.8    TUMOR MARKERS: No results for input(s): AFPTM, CEA, CA199, CHROMGRNA in the last 8760 hours.  Assessment and Plan:  Left face numbness; dizzy- gait unsteady L hand tingling Blurred vision Abn MRI Scheduled for cerebral arteriogram Pt and family aware of procedure benefits and risks including but not limited to: Infection; bleeding; CVA; dye allergy; vessel damage Agreeable to proceed Consent signed and in chart  Thank you for this interesting consult.  I greatly enjoyed meeting Alejandro Schneider and look forward to participating in their care.  Signed: Amiley Shishido A 05/20/2014, 8:03 AM   I spent a total of 40 Minutes  in face to face in clinical consultation, greater than 50% of which was counseling/coordinating care for cerebral arteriogram

## 2014-05-20 NOTE — Progress Notes (Signed)
  Echocardiogram  has been performed.  Alejandro Schneider M 05/20/2014, 4:08 PM

## 2014-05-20 NOTE — Consult Note (Signed)
Referring Physician: Ralene Bathe    Chief Complaint: Lip numbness and dizziness  HPI: Alejandro Schneider is an 64 y.o. male who reports that he went to bed normal last night.  At about 0130 this morning awakened and noted a area of numbness along the left side of his nose and onto the top of his lip on the left.  Then when the patient got up he was off balance and had difficulty walking.  He reports dizziness that he describes as a "wooziness" that he experiences on siting and standing.  Initial NIHSS of 1.  Date last known well: Date: 05/19/2014 Time last known well: Time: 23:45 tPA Given: No: Minimal symptoms  Past Medical History  Diagnosis Date  . Hyperlipidemia     Past Surgical History  Procedure Laterality Date  . Tonsillectomy  1972  . Inguinal hernia repair  10/16/02    Right  Dr Hassell Done  . Colonoscopy  2006  . Lumbar laminectomy/decompression microdiscectomy Right 05/30/2012    Procedure: LUMBAR LAMINECTOMY/DECOMPRESSION MICRODISCECTOMY 1 LEVEL;  Surgeon: Faythe Ghee, MD;  Location: [facility];  Service: Neurosurgery;  Laterality: Right;    Family History  Problem Relation Age of Onset  . Heart disease Father     CABGx4  . Cancer Father     Prostate (Radiation)  . Prostate cancer Father   . Heart disease Paternal Grandfather     MI  . Hypertension Neg Hx   . Drug abuse Neg Hx   . Alcohol abuse Neg Hx   . Stroke Neg Hx   . Colon cancer Neg Hx   . Diabetes Cousin   . Depression Mother    Social History:  reports that he has never smoked. He has never used smokeless tobacco. He reports that he drinks alcohol. He reports that he does not use illicit drugs.  Allergies:  Allergies  Allergen Reactions  . Bee Venom     S/p allergy shots (and no reaction with stings after shots)    Medications: I have reviewed the patient's current medications. Prior to Admission:  Current outpatient prescriptions:  Marland Kitchen  Multiple Vitamins-Minerals (MULTIVITAMIN,TX-MINERALS) tablet, Take  1 tablet by mouth daily.  , Disp: , Rfl:  .  clotrimazole-betamethasone (LOTRISONE) cream, Apply 1 application topically 2 (two) times daily. (Patient not taking: Reported on 05/20/2014), Disp: 30 g, Rfl: 1  ROS: History obtained from the patient  General ROS: negative for - chills, fatigue, fever, night sweats, weight gain or weight loss Psychological ROS: negative for - behavioral disorder, hallucinations, memory difficulties, mood swings or suicidal ideation Ophthalmic ROS: negative for - blurry vision, double vision, eye pain or loss of vision ENT ROS: negative for - epistaxis, nasal discharge, oral lesions, sore throat, tinnitus Allergy and Immunology ROS: negative for - hives or itchy/watery eyes Hematological and Lymphatic ROS: negative for - bleeding problems, bruising or swollen lymph nodes Endocrine ROS: negative for - galactorrhea, hair pattern changes, polydipsia/polyuria or temperature intolerance Respiratory ROS: negative for - cough, hemoptysis, shortness of breath or wheezing Cardiovascular ROS: negative for - chest pain, dyspnea on exertion, edema or irregular heartbeat Gastrointestinal ROS: negative for - abdominal pain, diarrhea, hematemesis, nausea/vomiting or stool incontinence Genito-Urinary ROS: negative for - dysuria, hematuria, incontinence or urinary frequency/urgency Musculoskeletal ROS: negative for - joint swelling or muscular weakness Neurological ROS: as noted in HPI Dermatological ROS: negative for rash and skin lesion changes  Physical Examination: Blood pressure 164/91, pulse 66, temperature 97.6 F (36.4 C), temperature source Oral,  resp. rate 12, height 5\' 10"  (1.778 m), weight 94.802 kg (209 lb), SpO2 100 %.  HEENT-  Normocephalic, no lesions, without obvious abnormality.  Normal external eye and conjunctiva.  Normal TM's bilaterally.  Normal auditory canals and external ears. Normal external nose, mucus membranes and septum.  Normal  pharynx. Cardiovascular- S1, S2 normal, pulses palpable throughout   Lungs- chest clear, no wheezing, rales, normal symmetric air entry Abdomen- soft, non-tender; bowel sounds normal; no masses,  no organomegaly Extremities- no edema Lymph-no adenopathy palpable Musculoskeletal-no joint tenderness, deformity or swelling Skin-warm and dry, no hyperpigmentation, vitiligo, or suspicious lesions  Neurological Examination Mental Status: Alert, oriented, thought content appropriate.  Speech fluent without evidence of aphasia.  Able to follow 3 step commands without difficulty. Cranial Nerves: II: Discs flat bilaterally; Visual fields grossly normal, pupils equal, round, reactive to light and accommodation III,IV, VI: ptosis not present, extra-ocular motions intact bilaterally V,VII: smile symmetric, facial light touch sensation decreased on the left top lip VIII: hearing normal bilaterally IX,X: gag reflex present XI: bilateral shoulder shrug XII: midline tongue extension Motor: Right : Upper extremity   5/5    Left:     Upper extremity   5/5  Lower extremity   5/5     Lower extremity   5/5 Tone and bulk:normal tone throughout; no atrophy noted Sensory: Pinprick and light touch intact throughout, bilaterally Deep Tendon Reflexes: 2+ and symmetric throughout Plantars: Right: downgoing   Left: downgoing Cerebellar: normal finger-to-nose and normal heel-to-shin testing bilaterally Gait: gait slightly wide based with leaning to the left    Laboratory Studies:  Basic Metabolic Panel:  Recent Labs Lab 05/20/14 0306 05/20/14 0313  NA 139 141  K 3.7 3.7  CL 108 105  CO2 26  --   GLUCOSE 164* 161*  BUN 19 21  CREATININE 0.93 0.80  CALCIUM 8.6  --     Liver Function Tests:  Recent Labs Lab 05/20/14 0306  AST 40*  ALT 29  ALKPHOS 62  BILITOT 0.8  PROT 6.6  ALBUMIN 3.8   No results for input(s): LIPASE, AMYLASE in the last 168 hours. No results for input(s): AMMONIA in  the last 168 hours.  CBC:  Recent Labs Lab 05/20/14 0306 05/20/14 0313  WBC 7.4  --   NEUTROABS 5.7  --   HGB 14.3 15.0  HCT 41.5 44.0  MCV 89.8  --   PLT 169  --     Cardiac Enzymes: No results for input(s): CKTOTAL, CKMB, CKMBINDEX, TROPONINI in the last 168 hours.  BNP: Invalid input(s): POCBNP  CBG:  Recent Labs Lab 05/20/14 0336  GLUCAP 150*    Microbiology: Results for orders placed or performed during the hospital encounter of 05/29/12  Surgical pcr screen     Status: None   Collection Time: 05/29/12  2:22 PM  Result Value Ref Range Status   MRSA, PCR NEGATIVE NEGATIVE Final   Staphylococcus aureus NEGATIVE NEGATIVE Final    Comment:        The Xpert SA Assay (FDA approved for NASAL specimens in patients over 48 years of age), is one component of a comprehensive surveillance program.  Test performance has been validated by [employer] for patients greater than or equal to 35 year old. It is not intended to diagnose infection nor to guide or monitor treatment.    Coagulation Studies:  Recent Labs  05/20/14 0306  LABPROT 13.2  INR 0.99    Urinalysis: No results for input(s): COLORURINE,  LABSPEC, PHURINE, GLUCOSEU, HGBUR, BILIRUBINUR, KETONESUR, PROTEINUR, UROBILINOGEN, NITRITE, LEUKOCYTESUR in the last 168 hours.  Invalid input(s): APPERANCEUR  Lipid Panel:    Component Value Date/Time   CHOL 199 09/03/2013 1647   TRIG 88.0 09/03/2013 1647   HDL 44.70 09/03/2013 1647   CHOLHDL 4 09/03/2013 1647   VLDL 17.6 09/03/2013 1647   LDLCALC 137* 09/03/2013 1647    HgbA1C:  Lab Results  Component Value Date   HGBA1C 4.9 01/19/2005   HGBA1C 4.9 01/19/2005    Urine Drug Screen:  No results found for: LABOPIA, COCAINSCRNUR, LABBENZ, AMPHETMU, THCU, LABBARB  Alcohol Level: No results for input(s): ETH in the last 168 hours.  Other results: EKG: sinus rhythm at 65 bpm  Imaging: Ct Angio Head W/cm &/or Wo Cm  05/20/2014   CLINICAL DATA:   Stroke, vertigo. Initial symptoms, follow-up evaluation.  EXAM: CT ANGIOGRAPHY HEAD AND NECK  TECHNIQUE: Multidetector CT imaging of the head and neck was performed using the standard protocol during bolus administration of intravenous contrast. Multiplanar CT image reconstructions and MIPs were obtained to evaluate the vascular anatomy. Carotid stenosis measurements (when applicable) are obtained utilizing NASCET criteria, using the distal internal carotid diameter as the denominator.  CONTRAST:  86mL OMNIPAQUE IOHEXOL 350 MG/ML SOLN  COMPARISON:  MRI and MRA of the head May 20, 2014 at 4:08 a.m.  FINDINGS: CTA NECK  Normal appearance of the thoracic arch, normal branch pattern. The origins of the innominate, left Common carotid artery and subclavian artery are widely patent.  Bilateral Common carotid arteries are widely patent, coursing in a straight line fashion. Mild eccentric intimal thickening. Normal appearance of the carotid bifurcations without hemodynamically significant stenosis by NASCET criteria. Normal appearance of the included internal carotid arteries.  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 Slight reconstitution of the proximal LEFT V2 segment via muscular branches with thready irregular flow through V2 segment, occluded distally. Mild extrinsic compression of the RIGHT vertebral artery due to degenerative change of the spine. However, there is apparent superimposed irregular distal RIGHT V2 segment, normal appearance of the RIGHT V3 segment.  No pseudoaneurysm.  No contrast extravasation.  Soft tissues are unremarkable. No acute osseous process though bone windows have not been submitted.  CTA HEAD  Anterior circulation: Normal appearance of the cervical internal carotid arteries, petrous, cavernous and supra clinoid internal carotid arteries. Widely patent anterior communicating artery. Normal appearance of the anterior and middle cerebral arteries. 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 Moderate anterior and middle cerebral arteries luminal regularity of the mid to distal branches likely represents atherosclerosis.  Posterior circulation: Absent LEFT vertebral artery. Thready irregular RIGHT V4 segment. The RIGHT posterior inferior cerebellar artery appears occluded with a 1 cm the origin. Thready irregular appearance of the RIGHT distal vertebral artery. No discernible contrast opacification the proximal basilar artery. The RIGHT the irregular flow the mid to distal basilar artery. Bilateral posterior communicating arteries are present in this may reflect retrograde flow. Less robust enhancement LEFT V2 and V3 segments, though appear patent.  No abnormal intracranial enhancement.  IMPRESSION: Occluded LEFT vertebral artery, with minimal reconstitution within the foraminal segments. Occlusion of the distal LEFT V3 segment through the intracranial level.  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 However, there is very regular distal RIGHT V4 segment and, apparent included RIGHT posterior inferior cerebellar artery.  High-grade stenosis versus occluded proximal basilar artery, small amount of contrast in the mid to distal basilar artery may represent retrograde flow, complete circle of Willis noted.  Moderately irregular intracranial vessels suggests atherosclerosis, less likely vasculopathy. Patent posterior cerebral arteries though, less robust flow related enhancement of the LEFT mid to distal PCA segments.   Electronically Signed   By: Elon Alas   On: 05/20/2014 05:46   Ct Head (brain) Wo Contrast  05/20/2014   CLINICAL DATA:  Left-sided numbness in the face.  EXAM: CT HEAD WITHOUT CONTRAST  TECHNIQUE: Contiguous axial images were obtained from the base of  the skull through the vertex without intravenous contrast.  COMPARISON:  None.  FINDINGS: There is no intracranial hemorrhage or mass. There is no extra-axial fluid collection. There is no evidence of acute infarction, with the exception of an equivocal hyperdense right middle cerebral artery. The ventricles are midline and symmetric. Basal cisterns are patent. The brain is otherwise unremarkable in appearance. Mild membrane thickening is incidentally noted in the ethmoid air cells and maxillary sinuses, with no significant acute bony abnormality.  IMPRESSION: Equivocal hyperdense right MCA. No intracranial hemorrhage. Otherwise normal brain.  Critical Value/emergent results were called by telephone at the time of interpretation on 05/20/2014 at 3:25 am to Dr. Quintella Reichert , who verbally acknowledged these results.   Electronically Signed   By: Andreas Newport M.D.   On: 05/20/2014 03:25   Ct Angio Neck W/cm &/or Wo/cm  05/20/2014   CLINICAL DATA:  Stroke, vertigo. Initial symptoms, follow-up evaluation.  EXAM: CT ANGIOGRAPHY HEAD AND NECK  TECHNIQUE: Multidetector CT imaging of the head and neck was performed using the standard protocol during bolus administration of intravenous contrast. Multiplanar CT image reconstructions and MIPs were obtained to evaluate the vascular anatomy. Carotid stenosis measurements (when applicable) are obtained utilizing NASCET criteria, using the distal internal carotid diameter as the denominator.  CONTRAST:  80mL OMNIPAQUE IOHEXOL 350 MG/ML SOLN  COMPARISON:  MRI and MRA of the head May 20, 2014 at 4:08 a.m.  FINDINGS: CTA NECK  Normal appearance of the thoracic arch, normal branch pattern. The origins of the innominate, left Common carotid artery and subclavian artery are widely patent.  Bilateral Common carotid arteries are widely patent, coursing in a straight line fashion. Mild eccentric intimal thickening. Normal appearance of the carotid bifurcations without  hemodynamically significant stenosis by NASCET criteria. Normal appearance of the included internal carotid arteries.  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 Slight reconstitution of the proximal LEFT V2 segment via muscular branches with thready irregular flow through V2 segment, occluded distally. Mild extrinsic compression of the RIGHT vertebral artery due to degenerative change of the spine. However, there is apparent superimposed irregular distal RIGHT V2 segment, normal appearance of the RIGHT V3 segment.  No pseudoaneurysm.  No contrast extravasation.  Soft tissues are unremarkable. No acute osseous process though bone windows have not been submitted.  CTA HEAD  Anterior circulation: Normal appearance of the cervical internal carotid arteries, petrous, cavernous and supra clinoid internal carotid arteries. Widely patent anterior communicating artery. Normal appearance of the anterior and middle cerebral arteries. 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 Moderate anterior and middle cerebral arteries luminal regularity of the mid to distal branches likely represents atherosclerosis.  Posterior circulation: Absent LEFT vertebral artery. Thready irregular RIGHT V4 segment. The RIGHT posterior inferior cerebellar artery appears occluded with a 1 cm the origin. Thready irregular appearance of the  RIGHT distal vertebral artery. No discernible contrast opacification the proximal basilar artery. The RIGHT the irregular flow the mid to distal basilar artery. Bilateral posterior communicating arteries are present in this may reflect retrograde flow. Less robust enhancement LEFT V2 and V3 segments, though appear patent.  No abnormal intracranial enhancement.  IMPRESSION: Occluded LEFT vertebral artery, with minimal reconstitution within the foraminal segments. Occlusion of the distal LEFT V3 segment through the intracranial level.  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 However, there is very regular distal RIGHT V4 segment and, apparent included RIGHT posterior inferior cerebellar artery.  High-grade stenosis versus occluded proximal basilar artery, small amount of contrast in the mid to distal basilar artery may represent retrograde flow, complete circle of Willis noted.  Moderately irregular intracranial vessels suggests atherosclerosis, less likely vasculopathy. Patent posterior cerebral arteries though, less robust flow related enhancement of the LEFT mid to distal PCA segments.   Electronically Signed   By: Elon Alas   On: 05/20/2014 05:46   Mr Jodene Nam Head Wo Contrast  05/20/2014   CLINICAL DATA:  LEFT facial numbness, headache and difficulty walking beginning at 1 a.m. history of hyperlipidemia and lumbar laminectomy.  EXAM: MRI HEAD WITHOUT CONTRAST  MRA HEAD WITHOUT CONTRAST  TECHNIQUE: Multiplanar, multiecho pulse sequences of the brain and surrounding structures were obtained without intravenous contrast. Angiographic images of the head were obtained using MRA technique without contrast.  COMPARISON:  CT of the head May 20, 2014 at 3:18 hours  FINDINGS: MRI HEAD FINDINGS  No reduced diffusion to suggest acute ischemia. No susceptibility artifact to suggest hemorrhage. Ventricles and sulci are normal for patient's age. Tiny linear T2 hyperintensity, low T1 signal on the LEFT cerebellum. No midline shift, mass effect or mass lesions. A few tiny supratentorial white matter T2 hyperintensities, less than expected for age.  No abnormal extra-axial fluid collections. Loss of LEFT vertebral artery flow void on the coronal T2 an, axial T2 to/24. Moderate paranasal sinus mucosal thickening, atretic LEFT maxillary sinus most consistent with chronic sinusitis disc, no acute  component. Mastoid air cells are well aerated. Ocular globes and orbital contents are unremarkable. No abnormal sellar expansion. No cerebellar tonsillar ectopia. No suspicious calvarial bone marrow signal.  MRA HEAD FINDINGS  Anterior circulation: Normal flow related enhancement of the included cervical, petrous, cavernous and supra clinoid internal carotid arteries. Patent anterior communicating artery. Normal flow related enhancement of the anterior and middle cerebral arteries, including more distal segments.  No large vessel occlusion, high-grade stenosis, abnormal luminal irregularity. 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 However, tortuous LEFT carotid siphon.  Posterior circulation: Absent flow related enhancement LEFT vertebral artery. Normal flow related enhancement of the RIGHT vertebral artery proximal V4 segment, however there is suspected slow flow versus occlusion of RIGHT posterior inferior cerebellar artery. High-grade stenosis of proximal basilar artery, with robust bilateral posterior communicating arteries present. Patent bilateral posterior cerebral arteries, however there is faint flow related enhancement LEFT P2/3 segments.  No large vessel occlusion, aneurysm.  IMPRESSION: MRI HEAD: No acute intracranial process ; normal noncontrast MRI of the brain for age.  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  MRA HEAD: 3 mm laterally directed outpouching of LEFT cavernous carotid artery segments, considering proximity to the supraclinoid artery, this is less likely a para ophthalmic aneurysm.  LEFT vertebral artery occlusion. In addition,  thready irregular distal RIGHT vertebral artery and high-grade stenosis of the proximal basilar artery. 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  Constellation of findings would be better characterized on CT angiogram of the head and neck.    Electronically Signed   By: Elon Alas   On: 05/20/2014 04:46   Mr Brain Wo Contrast  05/20/2014   CLINICAL DATA:  LEFT facial numbness, headache and difficulty walking beginning at 1 a.m. history of hyperlipidemia and lumbar laminectomy.  EXAM: MRI HEAD WITHOUT CONTRAST  MRA HEAD WITHOUT CONTRAST  TECHNIQUE: Multiplanar, multiecho pulse sequences of the brain and surrounding structures were obtained without intravenous contrast. Angiographic images of the head were obtained using MRA technique without contrast.  COMPARISON:  CT of the head May 20, 2014 at 3:18 hours  FINDINGS: MRI HEAD FINDINGS  No reduced diffusion to suggest acute ischemia. No susceptibility artifact to suggest hemorrhage. Ventricles and sulci are normal for patient's age. Tiny linear T2 hyperintensity, low T1 signal on the LEFT cerebellum. No midline shift, mass effect or mass lesions. A few tiny supratentorial white matter T2 hyperintensities, less than expected for age.  No abnormal extra-axial fluid collections. Loss of LEFT vertebral artery flow void on the coronal T2 an, axial T2 to/24. Moderate paranasal sinus mucosal thickening, atretic LEFT maxillary sinus most consistent with chronic sinusitis disc, no acute component. Mastoid air cells are well aerated. Ocular globes and orbital contents are unremarkable. No abnormal sellar expansion. No cerebellar tonsillar ectopia. No suspicious calvarial bone marrow signal.  MRA HEAD FINDINGS  Anterior circulation: Normal flow related enhancement of the included cervical, petrous, cavernous and supra clinoid internal carotid arteries. Patent anterior communicating artery. Normal flow related enhancement of the anterior and middle cerebral arteries, including more distal segments.  No large vessel occlusion, high-grade stenosis, abnormal luminal irregularity. 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 However, tortuous LEFT carotid siphon.  Posterior  circulation: Absent flow related enhancement LEFT vertebral artery. Normal flow related enhancement of the RIGHT vertebral artery proximal V4 segment, however there is suspected slow flow versus occlusion of RIGHT posterior inferior cerebellar artery. High-grade stenosis of proximal basilar artery, with robust bilateral posterior communicating arteries present. Patent bilateral posterior cerebral arteries, however there is faint flow related enhancement LEFT P2/3 segments.  No large vessel occlusion, aneurysm.  IMPRESSION: MRI HEAD: No acute intracranial process ; normal noncontrast MRI of the brain for age.  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  MRA HEAD: 3 mm laterally directed outpouching of LEFT cavernous carotid artery segments, considering proximity to the supraclinoid artery, this is less likely a para ophthalmic aneurysm.  LEFT vertebral artery occlusion. In addition, thready irregular distal RIGHT vertebral artery and high-grade stenosis of the proximal basilar artery. 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  Constellation of findings would be better characterized on CT angiogram of the head and neck.   Electronically Signed   By: Elon Alas   On: 05/20/2014 04:46    Assessment: 64 y.o. male presenting with some facial numbness and gait ataxia.  Concern is for acute infarct and code stroke called.  NIHSS of 1.  Head CT personally reviewed and shows no acute changes but due to symptomatology further imaging indicated at this time. Patient on no antiplatelet therapy at home.    Stroke Risk Factors - hyperlipidemia  Plan: 1. HgbA1c, fasting lipid panel 2. MRI,  MRA  of the brain without contrast 3. PT consult, OT consult, Speech consult 4. Echocardiogram 5. Carotid dopplers 6. Prophylactic therapy-Antiplatelet med: Aspirin - dose 325mg .  First dose to be given now 7. NPO until RN stroke  swallow screen 8. Telemetry monitoring 9. Frequent neuro checks   Alexis Goodell, MD [facility] [phone] 05/20/2014, 6:16 AM  Addendum:  Patient sent for MRI and MRA of the brain.  Films personally reviewed. There is no evidence of an acute infarct.  Left vertebral artery appears occluded.  Right vertebral artery reveals a high grade stenosis.  On return from imaging.  Patient reports worsening of symptoms.  Numbness on more of the left side of the face and in the left fingertips.  Dizziness worse.    Recommendations: 1.  CTA of head and neck   Alexis Goodell, MD [facility] [phone]  Addendum: CTA of head and neck reveals an occluded left vertebral artery, and right vertebral artery irregularity with evidence of a probable remote dissection.  The proximal basilar is not able to be visualized and it is suspected that the remainder of the basilar is being filled by retrograde flow.  There are multiple other areas of vascular irregularity.   Patient became nauseous with vomiting after procedure while in ED.  No further change in numbness.  No weakness noted.   Case discussed with IR.  Catheter angiogram to be performed this morning.  Zofran 4mg  administered.  Patient to be administered to stepdown.    This patient is critically ill and at significant risk of neurological worsening, death and care requires constant monitoring of vital signs, hemodynamics,respiratory and cardiac monitoring, neurological assessment, discussion with family, other specialists and medical decision making of high complexity. I spent 140 minutes of neurocritical care time  in the care of  this patient.  Alexis Goodell, MD [facility] [phone] 05/20/2014  6:16 AM

## 2014-05-20 NOTE — ED Notes (Signed)
Report given to charge nurse on 3MW. Pt will go to IR first.

## 2014-05-21 ENCOUNTER — Encounter (HOSPITAL_COMMUNITY): Payer: Self-pay | Admitting: Anesthesiology

## 2014-05-21 ENCOUNTER — Encounter (HOSPITAL_COMMUNITY)
Admission: EM | Disposition: A | Discharge status: Rehabilitation Facility | Payer: Self-pay | Source: Home / Self Care | Attending: Neurology

## 2014-05-21 ENCOUNTER — Inpatient Hospital Stay (HOSPITAL_COMMUNITY): Payer: 59

## 2014-05-21 ENCOUNTER — Inpatient Hospital Stay (HOSPITAL_COMMUNITY): Payer: 59 | Admitting: Anesthesiology

## 2014-05-21 DIAGNOSIS — E785 Hyperlipidemia, unspecified: Secondary | ICD-10-CM

## 2014-05-21 DIAGNOSIS — I63012 Cerebral infarction due to thrombosis of left vertebral artery: Secondary | ICD-10-CM

## 2014-05-21 DIAGNOSIS — G464 Cerebellar stroke syndrome: Secondary | ICD-10-CM

## 2014-05-21 HISTORY — PX: RADIOLOGY WITH ANESTHESIA: SHX6223

## 2014-05-21 LAB — COMPREHENSIVE METABOLIC PANEL
ALBUMIN: 3.5 g/dL (ref 3.5–5.2)
ALT: 23 U/L (ref 0–53)
AST: 26 U/L (ref 0–37)
Alkaline Phosphatase: 59 U/L (ref 39–117)
Anion gap: 8 (ref 5–15)
BUN: 9 mg/dL (ref 6–23)
CALCIUM: 8.2 mg/dL — AB (ref 8.4–10.5)
CO2: 24 mmol/L (ref 19–32)
Chloride: 105 mmol/L (ref 96–112)
Creatinine, Ser: 0.82 mg/dL (ref 0.50–1.35)
GFR calc non Af Amer: >90 mL/min (ref >90)
GLUCOSE: 114 mg/dL — AB (ref 70–99)
POTASSIUM: 3.6 mmol/L (ref 3.5–5.1)
SODIUM: 137 mmol/L (ref 135–145)
Total Bilirubin: 0.8 mg/dL (ref 0.3–1.2)
Total Protein: 6.1 g/dL (ref 6.0–8.3)

## 2014-05-21 LAB — CBC WITH DIFFERENTIAL/PLATELET
Basophils Absolute: 0 10*3/uL (ref 0.0–0.1)
Basophils Relative: 0 % (ref 0–1)
EOS PCT: 2 % (ref 0–5)
Eosinophils Absolute: 0.2 10*3/uL (ref 0.0–0.7)
HEMATOCRIT: 39 % (ref 39.0–52.0)
HEMOGLOBIN: 13.5 g/dL (ref 13.0–17.0)
LYMPHS ABS: 1.2 10*3/uL (ref 0.7–4.0)
Lymphocytes Relative: 15 % (ref 12–46)
MCH: 30.7 pg (ref 26.0–34.0)
MCHC: 34.6 g/dL (ref 30.0–36.0)
MCV: 88.6 fL (ref 78.0–100.0)
MONOS PCT: 10 % (ref 3–12)
Monocytes Absolute: 0.8 10*3/uL (ref 0.1–1.0)
Neutro Abs: 5.6 10*3/uL (ref 1.7–7.7)
Neutrophils Relative %: 73 % (ref 43–77)
Platelets: 176 10*3/uL (ref 150–400)
RBC: 4.4 MIL/uL (ref 4.22–5.81)
RDW: 13.1 % (ref 11.5–15.5)
WBC: 7.7 10*3/uL (ref 4.0–10.5)

## 2014-05-21 LAB — LIPID PANEL
CHOLESTEROL: 189 mg/dL (ref 0–200)
HDL: 38 mg/dL — AB (ref >39)
LDL Cholesterol: 121 mg/dL — ABNORMAL HIGH (ref 0–99)
Total CHOL/HDL Ratio: 5 RATIO
Triglycerides: 148 mg/dL (ref <150)
VLDL: 30 mg/dL (ref 0–40)

## 2014-05-21 LAB — HEPARIN LEVEL (UNFRACTIONATED)

## 2014-05-21 LAB — PLATELET INHIBITION P2Y12: Platelet Function  P2Y12: 270 [PRU] (ref 194–418)

## 2014-05-21 LAB — PROTIME-INR
INR: 1.04 (ref 0.00–1.49)
PROTHROMBIN TIME: 13.7 s (ref 11.6–15.2)

## 2014-05-21 LAB — POCT ACTIVATED CLOTTING TIME: Activated Clotting Time: 128 seconds

## 2014-05-21 LAB — APTT: APTT: 45 s — AB (ref 24–37)

## 2014-05-21 SURGERY — RADIOLOGY WITH ANESTHESIA
Anesthesia: Monitor Anesthesia Care

## 2014-05-21 MED ORDER — MIDAZOLAM HCL 2 MG/2ML IJ SOLN: 0.5000 mg | Freq: Once | INTRAMUSCULAR | Status: DC | PRN

## 2014-05-21 MED ORDER — CLOPIDOGREL BISULFATE 75 MG PO TABS
ORAL_TABLET | ORAL | Status: AC
  Filled 2014-05-21: qty 1

## 2014-05-21 MED ORDER — LIDOCAINE HCL (CARDIAC) 20 MG/ML IV SOLN
INTRAVENOUS | Status: DC | PRN
  Administered 2014-05-21: 80 mg via INTRAVENOUS
  Administered 2014-05-21: 20 mg via INTRAVENOUS

## 2014-05-21 MED ORDER — ASPIRIN EC 325 MG PO TBEC
DELAYED_RELEASE_TABLET | ORAL | Status: AC
  Filled 2014-05-21: qty 1

## 2014-05-21 MED ORDER — HEPARIN SODIUM (PORCINE) 1000 UNIT/ML IJ SOLN
INTRAMUSCULAR | Status: DC | PRN
  Administered 2014-05-21: 2000 [IU] via INTRAVENOUS

## 2014-05-21 MED ORDER — ROCURONIUM BROMIDE 100 MG/10ML IV SOLN
INTRAVENOUS | Status: DC | PRN
  Administered 2014-05-21: 50 mg via INTRAVENOUS

## 2014-05-21 MED ORDER — PROMETHAZINE HCL 25 MG/ML IJ SOLN: 6.2500 mg | INTRAMUSCULAR | Status: DC | PRN

## 2014-05-21 MED ORDER — ATORVASTATIN CALCIUM 10 MG PO TABS
20.0000 mg | ORAL_TABLET | Freq: Every day | ORAL | Status: DC
  Administered 2014-05-21 – 2014-05-22 (×2): 20 mg via ORAL
  Filled 2014-05-21 (×2): qty 2

## 2014-05-21 MED ORDER — ASPIRIN EC 81 MG PO TBEC
81.0000 mg | DELAYED_RELEASE_TABLET | Freq: Every day | ORAL | Status: DC
  Administered 2014-05-22 – 2014-05-23 (×2): 81 mg via ORAL
  Filled 2014-05-21 (×2): qty 1

## 2014-05-21 MED ORDER — PROPOFOL 10 MG/ML IV BOLUS
INTRAVENOUS | Status: DC | PRN
  Administered 2014-05-21: 150 mg via INTRAVENOUS

## 2014-05-21 MED ORDER — IOHEXOL 300 MG/ML  SOLN
150.0000 mL | Freq: Once | INTRAMUSCULAR | Status: AC | PRN
  Administered 2014-05-21: 25 mL via INTRA_ARTERIAL

## 2014-05-21 MED ORDER — GLYCOPYRROLATE 0.2 MG/ML IJ SOLN
INTRAMUSCULAR | Status: DC | PRN
  Administered 2014-05-21: 0.2 mg via INTRAVENOUS

## 2014-05-21 MED ORDER — BACLOFEN 10 MG PO TABS
10.0000 mg | ORAL_TABLET | Freq: Three times a day (TID) | ORAL | Status: DC
  Administered 2014-05-21 – 2014-05-22 (×5): 10 mg via ORAL
  Filled 2014-05-21 (×6): qty 1

## 2014-05-21 MED ORDER — LIDOCAINE HCL 1 % IJ SOLN
INTRAMUSCULAR | Status: AC
  Filled 2014-05-21: qty 20

## 2014-05-21 MED ORDER — FENTANYL CITRATE 0.05 MG/ML IJ SOLN
INTRAMUSCULAR | Status: DC | PRN
  Administered 2014-05-21: 50 ug via INTRAVENOUS
  Administered 2014-05-21: 200 ug via INTRAVENOUS

## 2014-05-21 MED ORDER — SODIUM CHLORIDE 0.9 % IV SOLN
INTRAVENOUS | Status: DC | PRN
  Administered 2014-05-21: 08:00:00 via INTRAVENOUS

## 2014-05-21 MED ORDER — ONDANSETRON HCL 4 MG/2ML IJ SOLN: 4.0000 mg | Freq: Four times a day (QID) | INTRAMUSCULAR | Status: DC | PRN

## 2014-05-21 MED ORDER — EPHEDRINE SULFATE 50 MG/ML IJ SOLN
INTRAMUSCULAR | Status: DC | PRN
  Administered 2014-05-21 (×2): 10 mg via INTRAVENOUS

## 2014-05-21 MED ORDER — NEOSTIGMINE METHYLSULFATE 10 MG/10ML IV SOLN
INTRAVENOUS | Status: DC | PRN
  Administered 2014-05-21: 3 mg via INTRAVENOUS

## 2014-05-21 MED ORDER — CLOPIDOGREL BISULFATE 75 MG PO TABS
75.0000 mg | ORAL_TABLET | Freq: Every day | ORAL | Status: DC
  Administered 2014-05-22 – 2014-05-23 (×2): 75 mg via ORAL
  Filled 2014-05-21 (×2): qty 1

## 2014-05-21 MED ORDER — ENOXAPARIN SODIUM 40 MG/0.4ML ~~LOC~~ SOLN
40.0000 mg | SUBCUTANEOUS | Status: DC
  Administered 2014-05-21 – 2014-05-22 (×2): 40 mg via SUBCUTANEOUS
  Filled 2014-05-21 (×2): qty 0.4

## 2014-05-21 MED ORDER — MEPERIDINE HCL 25 MG/ML IJ SOLN: 6.2500 mg | INTRAMUSCULAR | Status: DC | PRN

## 2014-05-21 MED ORDER — CEFAZOLIN SODIUM-DEXTROSE 2-3 GM-% IV SOLR
INTRAVENOUS | Status: AC
  Administered 2014-05-21: 2 g via INTRAVENOUS
  Filled 2014-05-21: qty 50

## 2014-05-21 MED ORDER — FAMOTIDINE 20 MG PO TABS
20.0000 mg | ORAL_TABLET | Freq: Two times a day (BID) | ORAL | Status: DC
  Administered 2014-05-21 – 2014-05-23 (×4): 20 mg via ORAL
  Filled 2014-05-21 (×4): qty 1

## 2014-05-21 MED ORDER — ARTIFICIAL TEARS OP OINT
TOPICAL_OINTMENT | OPHTHALMIC | Status: DC | PRN
  Administered 2014-05-21: 1 via OPHTHALMIC

## 2014-05-21 MED ORDER — FENTANYL CITRATE 0.05 MG/ML IJ SOLN: 25.0000 ug | INTRAMUSCULAR | Status: DC | PRN

## 2014-05-21 MED ORDER — SODIUM CHLORIDE 0.9 % IV SOLN
INTRAVENOUS | Status: DC
  Administered 2014-05-21: 12:00:00 via INTRAVENOUS

## 2014-05-21 MED ORDER — NITROGLYCERIN 1 MG/10 ML FOR IR/CATH LAB
INTRA_ARTERIAL | Status: AC
  Filled 2014-05-21: qty 10

## 2014-05-21 NOTE — Transfer of Care (Signed)
Immediate Anesthesia Transfer of Care Note  Patient: Alejandro Schneider  Procedure(s) Performed: Procedure(s): ANGIOPLASTY POSSIBLE STENT (N/A)  Patient Location: PACU  Anesthesia Type:General  Level of Consciousness: awake, alert , oriented and sedated  Airway & Oxygen Therapy: Patient Spontanous Breathing and Patient connected to nasal cannula oxygen  Post-op Assessment: Report given to RN, Post -op Vital signs reviewed and stable and Patient moving all extremities  Post vital signs: Reviewed and stable  Last Vitals:  Filed Vitals:   05/21/14 1141  BP: 133/76  Pulse: 62  Temp:   Resp: 8    Complications: No apparent anesthesia complications

## 2014-05-21 NOTE — Progress Notes (Signed)
PT Cancellation Note  Patient Details Name: Alejandro Schneider MRN: [id number] DOB: 1950/11/03   Cancelled Treatment:    Reason Eval/Treat Not Completed: Patient at procedure or test/unavailable.  Pt for Angio and possible stent placement today.  Will hold PT at this time and f/u as appropriate.     Norissa Bartee, Thornton Papas 05/21/2014, 7:54 AM

## 2014-05-21 NOTE — Anesthesia Procedure Notes (Addendum)
Procedure Name: Intubation Date/Time: 05/21/2014 9:18 AM Performed by: Scheryl Darter Pre-anesthesia Checklist: Patient identified, Emergency Drugs available, Suction available, Patient being monitored and Timeout performed Patient Re-evaluated:Patient Re-evaluated prior to inductionOxygen Delivery Method: Circle system utilized Preoxygenation: Pre-oxygenation with 100% oxygen Intubation Type: IV induction Ventilation: Mask ventilation without difficulty Laryngoscope Size: Mac and 4 Grade View: Grade II Tube type: Glide rite Tube size: 7.5 mm Number of attempts: 1 Airway Equipment and Method: Stylet Placement Confirmation: ETT inserted through vocal cords under direct vision,  positive ETCO2 and breath sounds checked- equal and bilateral Secured at: 24 cm Tube secured with: Tape Dental Injury: Teeth and Oropharynx as per pre-operative assessment

## 2014-05-21 NOTE — Anesthesia Preprocedure Evaluation (Signed)
Anesthesia Evaluation  Patient identified by MRN, date of birth, ID band Patient awake    Reviewed: Allergy & Precautions, NPO status , Patient's Chart, lab work & pertinent test results  History of Anesthesia Complications Negative for: history of anesthetic complications  Airway Mallampati: I  TM Distance: >3 FB Neck ROM: Full    Dental  (+) Teeth Intact, Dental Advisory Given   Pulmonary neg pulmonary ROS,  breath sounds clear to auscultation        Cardiovascular - anginanegative cardio ROS  Rhythm:Regular Rate:Normal     Neuro/Psych TIA   GI/Hepatic negative GI ROS, Neg liver ROS,   Endo/Other  negative endocrine ROS  Renal/GU negative Renal ROS     Musculoskeletal   Abdominal   Peds  Hematology negative hematology ROS (+)   Anesthesia Other Findings   Reproductive/Obstetrics                             Anesthesia Physical Anesthesia Plan  ASA: III  Anesthesia Plan: General and MAC   Post-op Pain Management:    Induction: Intravenous  Airway Management Planned: Oral ETT and Nasal Cannula  Additional Equipment: Arterial line  Intra-op Plan:   Post-operative Plan: Possible Post-op intubation/ventilation  Informed Consent: I have reviewed the patients History and Physical, chart, labs and discussed the procedure including the risks, benefits and alternatives for the proposed anesthesia with the patient or authorized representative who has indicated his/her understanding and acceptance.   Dental advisory given  Plan Discussed with: CRNA and Surgeon  Anesthesia Plan Comments: (Plan routine monitors, A line, MAC for angiography, GETA once proceed to intervention)        Anesthesia Quick Evaluation

## 2014-05-21 NOTE — Progress Notes (Signed)
STROKE TEAM PROGRESS NOTE   HISTORY OF PRESENT ILLNESS ABDIAS HICKAM is an 64 y.o. male who reports that he went to bed normal last night. At about 0130 this morning awakened and noted a area of numbness along the left side of his nose and onto the top of his lip on the left. Then when the patient got up he was off balance and had difficulty walking. He reports dizziness that he describes as a "wooziness" that he experiences on siting and standing. Initial NIHSS of 1.   SUBJECTIVE (INTERVAL HISTORY) His wife is at the bedside.  Overall he feels his condition is gradually improving. Had MRI last night, confirmed left lateral medullary infarct, consistent with left PICA stroke. Had INR procedure this morning, again showing left vertebral artery occlusion, right vertebral artery high-grade stenosis, and the proximal basilar artery high-grade stenosis. No stenting performed. Patient ate well last night, no nausea or vomiting. Dizziness getting better.   OBJECTIVE Temp:  [97 F (36.1 C)-98.7 F (37.1 C)] 98.5 F (36.9 C) (02/16 1137) Pulse Rate:  [58-71] 62 (02/16 1141) Cardiac Rhythm:  [-] Normal sinus rhythm (02/16 1119) Resp:  [8-23] 8 (02/16 1141) BP: (121-159)/(68-93) 133/76 mmHg (02/16 1141) SpO2:  [97 %-100 %] 100 % (02/16 1141) Arterial Line BP: (151-163)/(71-73) 163/73 mmHg (02/16 1141)   Recent Labs Lab 05/20/14 0336  GLUCAP 150*    Recent Labs Lab 05/20/14 0306 05/20/14 0313 05/21/14 0234  NA 139 141 137  K 3.7 3.7 3.6  CL 108 105 105  CO2 26  --  24  GLUCOSE 164* 161* 114*  BUN 19 21 9   CREATININE 0.93 0.80 0.82  CALCIUM 8.6  --  8.2*    Recent Labs Lab 05/20/14 0306 05/21/14 0234  AST 40* 26  ALT 29 23  ALKPHOS 62 59  BILITOT 0.8 0.8  PROT 6.6 6.1  ALBUMIN 3.8 3.5    Recent Labs Lab 05/20/14 0306 05/20/14 0313 05/21/14 0234  WBC 7.4  --  7.7  NEUTROABS 5.7  --  5.6  HGB 14.3 15.0 13.5  HCT 41.5 44.0 39.0  MCV 89.8  --  88.6  PLT 169  --   176   No results for input(s): CKTOTAL, CKMB, CKMBINDEX, TROPONINI in the last 168 hours.  Recent Labs  05/20/14 0306 05/21/14 0234  LABPROT 13.2 13.7  INR 0.99 1.04   No results for input(s): COLORURINE, LABSPEC, PHURINE, GLUCOSEU, HGBUR, BILIRUBINUR, KETONESUR, PROTEINUR, UROBILINOGEN, NITRITE, LEUKOCYTESUR in the last 72 hours.  Invalid input(s): APPERANCEUR     Component Value Date/Time   CHOL 189 05/21/2014 0234   TRIG 148 05/21/2014 0234   HDL 38* 05/21/2014 0234   CHOLHDL 5.0 05/21/2014 0234   VLDL 30 05/21/2014 0234   LDLCALC 121* 05/21/2014 0234   Lab Results  Component Value Date   HGBA1C 4.9 01/19/2005   HGBA1C 4.9 01/19/2005   No results found for: LABOPIA, COCAINSCRNUR, LABBENZ, AMPHETMU, THCU, LABBARB  No results for input(s): ETH in the last 168 hours.  I have personally reviewed the radiological images below and agree with the radiology interpretations.  Ct Angio Head and neck W/cm &/or Wo Cm  05/20/2014  IMPRESSION: Occluded LEFT vertebral artery, with minimal reconstitution within the foraminal segments. Occlusion of the distal LEFT V3 segment through the intracranial level.  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 However, there is very  regular distal RIGHT V4 segment and, apparent included RIGHT posterior inferior cerebellar artery.  High-grade stenosis versus occluded proximal basilar artery, small amount of contrast in the mid to distal basilar artery may represent retrograde flow, complete circle of Willis noted.  Moderately irregular intracranial vessels suggests atherosclerosis, less likely vasculopathy. Patent posterior cerebral arteries though, less robust flow related enhancement of the LEFT mid to distal PCA segments.      Ct Head (brain) Wo Contrast  05/20/2014   IMPRESSION: Equivocal  hyperdense right MCA. No intracranial hemorrhage. Otherwise normal brain.    Mri and Mra Head Wo Contrast  05/20/2014   IMPRESSION: MRI HEAD: No acute intracranial process ; normal noncontrast MRI of the brain for age.  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  MRA HEAD: 3 mm laterally directed outpouching of LEFT cavernous carotid artery segments, considering proximity to the supraclinoid artery, this is less likely a para ophthalmic aneurysm.  LEFT vertebral artery occlusion. In addition, thready irregular distal RIGHT vertebral artery and high-grade stenosis of the proximal basilar artery. 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  Constellation of findings would be better characterized on CT angiogram of the head and neck.    [name] W/o Cm  05/20/2014   IMPRESSION: Acute infarct left posterior lateral medulla.    Ir Angiogram  05/20/2014   IMPRESSION: Occluded left vertebral artery in its entirety.  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 Antegrade flow is seen in the basilar artery to the level of the superior cerebellar arteries.  Both posterior communicating arteries open.    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 2.Retrograde opacification of LT VBJ from hypoplastic RT VA  TCD - right vert and BA antegrade flow  2D Echocardiogram  - Left ventricle: The cavity size was normal. Wall thickness was normal. Systolic function was normal. The estimated ejection fraction was in the range of 60% to 65%. Doppler parameters are consistent with abnormal left ventricular relaxation (grade 1 diastolic dysfunction).   PHYSICAL EXAM  Temp:  [97 F (36.1 C)-98.7 F (37.1 C)] 98.5 F (36.9 C) (02/16 1137) Pulse Rate:  [58-71] 62 (02/16 1141) Resp:  [8-23] 8 (02/16 1141) BP:  (121-159)/(68-93) 133/76 mmHg (02/16 1141) SpO2:  [97 %-100 %] 100 % (02/16 1141) Arterial Line BP: (151-163)/(71-73) 163/73 mmHg (02/16 1141)  General - Well nourished, well developed, in no apparent distress.  Ophthalmologic - fundi not visualized due to photophobia.  Cardiovascular - Regular rate and rhythm with no murmur.  Neck - supple, no carotid bruits  Mental Status -  Level of arousal and orientation to time, place, and person were intact. Language including expression, naming, repetition, comprehension was assessed and found intact.  Cranial Nerves II - XII - II - Visual field intact OU. III, IV, VI - Extraocular movements intact. V - Facial sensation decreased on the left V2 V3. VII - Facial movement intact bilaterally. VIII - Hearing & vestibular intact bilaterally, unsustained nystagmus on the right gaze, direction to right. X - Palate elevates symmetrically. XI - Chin turning & shoulder shrug intact bilaterally. XII - Tongue protrusion intact.  Motor Strength - The patient's strength was normal in all extremities and pronator drift was absent.  Bulk was normal and fasciculations were absent.   Motor Tone - Muscle tone was assessed at the neck and appendages and was normal.  Reflexes - The patient's reflexes were symmetrical in all extremities and he had no pathological reflexes.  Sensory - Light touch, temperature/pinprick were assessed and were symmetrical.    Coordination - The patient had mild ataxia or dysmetria on left hand FTN.  Tremor was absent.  Gait and Station - not tested due to dizziness.   ASSESSMENT/PLAN Alejandro Schneider is a 64 y.o. male with history of HLD admitted for acute onset dizziness, off balance, left face numbness, and vomiting. Symptoms gradually improved.    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  MRI  left lateral medullary infarct  MRA   left vertebral artery occlusion, right vertebral artery distal high-grade stenosis, basilar artery proximal high-grade stenosis.  CTA head and neck - similar as MRA, no significant carotid artery stenosis  2D Echo  unremarkable  LDL 121  HgbA1c pending  Lovenox for VTE prophylaxis  Diet Heart   no antithrombotic prior to admission, now on aspirin 81 mg orally every day and clopidogrel 75 mg orally every day. Will continue dural antiplatelet for 3 months and then Plavix alone.  Patient counseled to be compliant with his antithrombotic medications  Ongoing aggressive stroke risk factor management  Therapy recommendations:  Pending  Disposition:  Pending  Hypertension  Home meds:   None Permissive hypertension (OK if <220/120) for 24-48 hours post stroke and then gradually normalized within 5-7 days. Currently on no blood pressure medications  Stable  Blood pressure goal 130-150 for at least 2-3 weeks to avoid posterior circulation hypoperfusion  Hyperlipidemia  Home meds:  None   LDL 121, goal < 70  Add Lipitor 20  Continue statin at discharge  Other Stroke Risk Factors  Advanced age  Obesity, Body mass index is 29.99 kg/(m^2).   Hospital day # 1  I have personally obtained the history, examined the patient, evaluated laboratory data, individually viewed imaging studies and agree with radiology interpretations. I also obtained additional history from pt's wife and sister at bedside. I also discussed with Dr. Delphina Cahill regarding his care plan.    Rosalin Hawking, MD PhD Stroke Neurology 05/21/2014 1:48 PM    To contact Stroke Continuity provider, please refer to [email or web address] After hours, contact General Neurology

## 2014-05-21 NOTE — Procedures (Signed)
S/P RT vertebral arteriogram. RT brachial approach. Findings. 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 2.Retrograde opacification of LT VBJ from hypoplastic RT VA

## 2014-05-21 NOTE — Anesthesia Postprocedure Evaluation (Signed)
  Anesthesia Post-op Note  Patient: Alejandro Schneider  Procedure(s) Performed: Procedure(s): ANGIOPLASTY POSSIBLE STENT (N/A)  Patient Location: ICU  Anesthesia Type:General  Level of Consciousness: awake, alert , oriented and patient cooperative  Airway and Oxygen Therapy: Patient Spontanous Breathing  Post-op Pain: none  Post-op Assessment: Post-op Vital signs reviewed, Patient's Cardiovascular Status Stable, Respiratory Function Stable, Patent Airway, No signs of Nausea or vomiting and Pain level controlled  Post-op Vital Signs: Reviewed and stable  Last Vitals:  Filed Vitals:   05/21/14 1500  BP: 125/69  Pulse: 77  Temp:   Resp: 17    Complications: No apparent anesthesia complications

## 2014-05-21 NOTE — Progress Notes (Signed)
Pt is admitted to 4 N03 from [facility]. Admission VS are stable

## 2014-05-21 NOTE — Progress Notes (Signed)
Pt undergoing procedure. Will f/u tomorrow for cognitive linguistic eval.  Herbie [place], [place] CCC-SLP [phone]

## 2014-05-21 NOTE — Progress Notes (Signed)
ANTICOAGULATION CONSULT NOTE - Follow Up Consult  Pharmacy Consult for Heparin  Indication: stroke  Allergies  Allergen Reactions  . Bee Venom     S/p allergy shots (and no reaction with stings after shots)    Patient Measurements: Height: 5\' 10"  (177.8 cm) Weight: 209 lb (94.802 kg) IBW/kg (Calculated) : 73  Vital Signs: Temp: 98.7 F (37.1 C) (02/16 0013) Temp Source: Oral (02/16 0013) BP: 127/76 mmHg (02/16 0100) Pulse Rate: 69 (02/16 0100)  Labs:  Recent Labs  05/20/14 0306 05/20/14 0313 05/20/14 2049 05/21/14 0234  HGB 14.3 15.0  --  13.5  HCT 41.5 44.0  --  39.0  PLT 169  --   --  176  APTT 28  --   --  45*  LABPROT 13.2  --   --  13.7  INR 0.99  --   --  1.04  HEPARINUNFRC  --   --  <0.10* <0.10*  CREATININE 0.93 0.80  --   --     Estimated Creatinine Clearance: 109.2 mL/min (by C-G formula based on Cr of 0.8).   Assessment: Sub-therapeutic heparin level despite rate increase.   Goal of Therapy:  Heparin level 0.3-0.5 units/ml Monitor platelets by anticoagulation protocol: Yes   Plan:  -Increase heparin drip to 1500 units/hr -RN turning off heparin in ~2 hour pre-procedure -F/U after procedure   Narda Bonds 05/21/2014,3:49 AM

## 2014-05-22 DIAGNOSIS — E785 Hyperlipidemia, unspecified: Secondary | ICD-10-CM | POA: Insufficient documentation

## 2014-05-22 DIAGNOSIS — I999 Unspecified disorder of circulatory system: Secondary | ICD-10-CM

## 2014-05-22 DIAGNOSIS — I651 Occlusion and stenosis of basilar artery: Secondary | ICD-10-CM

## 2014-05-22 DIAGNOSIS — I63011 Cerebral infarction due to thrombosis of right vertebral artery: Principal | ICD-10-CM

## 2014-05-22 DIAGNOSIS — R42 Dizziness and giddiness: Secondary | ICD-10-CM

## 2014-05-22 DIAGNOSIS — R066 Hiccough: Secondary | ICD-10-CM | POA: Insufficient documentation

## 2014-05-22 DIAGNOSIS — I63012 Cerebral infarction due to thrombosis of left vertebral artery: Secondary | ICD-10-CM | POA: Insufficient documentation

## 2014-05-22 LAB — HEMOGLOBIN A1C
HEMOGLOBIN A1C: 5.6 % (ref 4.8–5.6)
MEAN PLASMA GLUCOSE: 114 mg/dL

## 2014-05-22 MED ORDER — BACLOFEN 10 MG PO TABS
20.0000 mg | ORAL_TABLET | Freq: Three times a day (TID) | ORAL | Status: DC
  Administered 2014-05-23 (×2): 20 mg via ORAL
  Filled 2014-05-22 (×2): qty 2

## 2014-05-22 MED ORDER — CHLORPROMAZINE HCL 25 MG PO TABS
25.0000 mg | ORAL_TABLET | Freq: Three times a day (TID) | ORAL | Status: DC
  Administered 2014-05-22: 25 mg via ORAL
  Filled 2014-05-22 (×4): qty 1

## 2014-05-22 NOTE — Consult Note (Signed)
Physical Medicine and Rehabilitation Consult Reason for Consult: Left lateral medullary infarct Referring Physician: Dr.Xu   HPI: Alejandro Schneider is a 64 y.o.right handed  male with history of hyperlipidemia. Patient independent prior to admission living with his wife working for [employer] Admitted 05/20/2014 with dizziness, facial weakness and headache. 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 MRA of the head with occluded left vertebral artery as well as preocclusive stenosis of the right vertebrobasilar junction and possibly the proximal basilar artery. Echocardiogram with ejection fraction 61% grade 1 diastolic dysfunction. 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 Attempted stenting by interventional radiology 05/21/2014 was unsuccessful. Neurology follow-up currently maintained on aspirin and Plavix for CVA prophylaxis. Subcutaneous Lovenox for DVT prophylaxis. Patient is tolerating a regular diet. Physical therapy evaluation completed 05/22/2014 with recommendations of physical medicine rehabilitation consult.  Patient sitting at bedside, consistent, wife and friend of family are visiting.  Review of Systems  Eyes: Positive for blurred vision.  Neurological: Positive for dizziness, weakness and headaches.  All other systems reviewed and are negative.  Past Medical History  Diagnosis Date  . Hyperlipidemia    Past Surgical History  Procedure Laterality Date  . Tonsillectomy  1972  . Inguinal hernia repair  10/16/02    Right  Dr Hassell Done  . Colonoscopy  2006  . Lumbar laminectomy/decompression microdiscectomy Right 05/30/2012    Procedure: LUMBAR LAMINECTOMY/DECOMPRESSION MICRODISCECTOMY 1 LEVEL;  Surgeon: Faythe Ghee, MD;  Location: [facility];  Service: Neurosurgery;  Laterality: Right;   Family History  Problem Relation Age of Onset  . Heart disease Father     CABGx4  .  Cancer Father     Prostate (Radiation)  . Prostate cancer Father   . Heart disease Paternal Grandfather     MI  . Hypertension Neg Hx   . Drug abuse Neg Hx   . Alcohol abuse Neg Hx   . Stroke Neg Hx   . Colon cancer Neg Hx   . Diabetes Cousin   . Depression Mother    Social History:  reports that he has never smoked. He has never used smokeless tobacco. He reports that he drinks alcohol. He reports that he does not use illicit drugs. Allergies:  Allergies  Allergen Reactions  . Bee Venom     S/p allergy shots (and no reaction with stings after shots)   Medications Prior to Admission  Medication Sig Dispense Refill  . Multiple Vitamins-Minerals (MULTIVITAMIN,TX-MINERALS) tablet Take 1 tablet by mouth daily.      . clotrimazole-betamethasone (LOTRISONE) cream Apply 1 application topically 2 (two) times daily. (Patient not taking: Reported on 05/20/2014) 30 g 1    Home: [place] expects to be discharged to:: Inpatient rehab Additional Comments: lives with wife  Functional History: Prior Function Level of Independence: Independent Comments: works as Clinical cytogeneticist for [employer] Status:  Mobility: Bed Mobility Overal bed mobility: Needs Assistance Bed Mobility: Supine to Sit Supine to sit: Min guard General bed mobility comments: increased time but able to log roll and push self up into sitting Transfers Overall transfer level: Needs assistance Equipment used: None, Rolling walker (2 wheeled) Transfers: Sit to/from Stand Sit to Stand: Min assist, Mod assist General transfer comment: pt modA when transitioning into standing without RW due to instability and strong lean to L. minA with RW due to increased support from RW. Ambulation/Gait Ambulation/Gait assistance: +2  safety/equipment, Mod assist Ambulation Distance (Feet): 10 Feet (x2) Assistive device: Rolling walker (2 wheeled), None Gait Pattern/deviations: Step-to pattern, Decreased stride  length, Drifts right/left Gait velocity: slow General Gait Details: attempted to ambulate without RW however pt with strong lean to left requiring modA to maintain upright posture. pt with scissored gait pattern and c/o dizziness of 5/10. pt with slightly improved with RW however con't to lean Left and even tips RW. Pt aware but unable to self correct    ADL:    Cognition: Cognition Overall Cognitive Status: Within Functional Limits for tasks assessed Orientation Level: Oriented X4 Cognition Arousal/Alertness: Awake/alert Behavior During Therapy: WFL for tasks assessed/performed Overall Cognitive Status: Within Functional Limits for tasks assessed  Blood pressure 159/93, pulse 61, temperature 98.7 F (37.1 C), temperature source Oral, resp. rate 18, height 5\' 10"  (1.778 m), weight 94.802 kg (209 lb), SpO2 98 %. Physical Exam  Constitutional: He is oriented to person, place, and time. He appears well-developed.  HENT:  Head: Normocephalic.  Eyes: EOM are normal.  Without nystagamus  Neck: Normal range of motion. Neck supple. No thyromegaly present.  Cardiovascular: Normal rate and regular rhythm.   Respiratory: Effort normal and breath sounds normal. No respiratory distress.  GI: Soft. Bowel sounds are normal. He exhibits no distension.  Neurological: He is alert and oriented to person, place, and time.  Follows commands.Decreased fine motor skills and somewhat ataxic   No evidence of ataxia right finger-nose-finger, moderate ataxia left finger-nose-finger Horizontal nystagmus to the right, no evidence of extraocular muscle weakness Neuro:  Eyes without evidence of nystagmus  Tone is normal without evidence of spasticity Cerebellar exam shows no evidence of ataxia on finger nose finger or heel to shin testing  evidence of trunkal ataxia on Left  Motor strength is 5/5 in bilateral deltoid, biceps, triceps, finger flexors and extensors, wrist flexors and extensors, hip flexors,  knee flexors and extensors, ankle dorsiflexors, plantar flexors, invertors and evertors, toe flexors and extensors  Sensory exam is normal to , proprioception and light touch in the upper and lower limbs   Cranial nerves II- Visual fields are intact to confrontation testing, no blurring of vision III- no evidence of ptosis, upward, downward and medial gaze intact IV- no vertical diplopia or head tilt V- no facial numbness or masseter weakness VI- no pupil abduction weakness VII- no facial droop, good lid closure VII- normal auditory acuity IX- decreased vocal volume X- + hoarseness XI- no trap or SCM weakness XII- no glossal weakness    No results found for this or any previous visit (from the past 24 hour(s)). Ir US Guide Vasc Access Right  05/21/2014   CLINICAL DATA:  Vertebrobasilar ischemia. Occluded left vertebral artery.  EXAM: IR ANGIO VERTEBRAL SEL VERTEBRAL UNI RIGHT MOD SED RIGHT VERTEBRAL ARTERY ANGIOGRAM:  ANESTHESIA/SEDATION: General anesthesia.  MEDICATIONS: As per general anesthesia.  CONTRAST:  37mL OMNIPAQUE IOHEXOL 300 MG/ML  SOLN  PROCEDURE: Following full explanation of the procedure along with the potential associated complications, an informed witnessed consent was obtained.  The patient was put under general anesthesia by the Department of Anesthesiology at [facility]  The right antecubital fossa was prepped and draped in the usual sterile fashion.  Thereafter using a modified Seldinger technique, with ultrasound guidance, access into the right brachial artery was then obtained without difficulty. Over a 0.035 inch guidewire, a 5 French Pinnacle sheath was inserted.  Through this, a 5 [place] JB1 catheter was then advanced and positioned in  the right vertebral artery in its proximal 1/3 using biplane roadmap technique and constant fluoroscopic guidance. Arteriograms were then performed centered over the intracranial compartment.  COMPLICATIONS: None immediate.   FINDINGS: The right vertebral artery injection demonstrates the origin of the right vertebral artery to be normal.  The vessel is otherwise seen to opacify normally to the cranial skull base.  Opacification of the right vertebrobasilar junction is seen proximally. The right posterior-inferior cerebellar artery is seen to opacify proximally with a tapered severe stenosis associated with complete occlusion.  Distal to this, the right vertebrobasilar junction demonstrates a right anterior-inferior cerebellar artery which appears to opacify the right posterior-inferior cerebellar artery distribution.  Severe narrowing of the right vertebrobasilar junction is seen proximal to the confluence with the contralateral vertebral artery to form the basilar artery. The basilar artery itself proximally just distal to the confluence has a severe 95-97% stenosis with antegrade flow more distally to supply the left anterior-inferior cerebellar artery, and the superior cerebellar artery. Transit opacification of the posterior communicating artery is seen on the right.  Also seen is retrograde opacification of the left vertebrobasilar junction with opacification of the left posterior-inferior cerebellar artery.  There is no clearance of contrast antegradely in the left vertebrobasilar junction consistent with a complete angiographic occlusion.  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  Severe pre-occlusive stenosis of the right vertebrobasilar junction just proximal to the confluence to form the basilar artery.  95-97% stenosis of the proximal basilar artery just distal to the confluence.  Retrograde opacification of the left vertebrobasilar junction and left posterior-inferior cerebellar artery from the right vertebral artery injection.   Electronically Signed   By: Luanne Bras M.D.   On: 05/21/2014 11:30   Mr Graham W/o Cm  05/20/2014   ADDENDUM REPORT: 05/20/2014 18:27   ADDENDUM: Critical Value/emergent results were called by telephone at the time of interpretation on 05/20/2014 at 6:27 pm to Dr. Wallie Char , who verbally acknowledged these results.   Electronically Signed   By: Franchot Gallo M.D.   On: 05/20/2014 18:27   05/20/2014   CLINICAL DATA:  Stroke.  Vertigo.  EXAM: MRI HEAD WITHOUT CONTRAST  TECHNIQUE: Multiplanar, multiecho pulse sequences of the brain and surrounding structures were obtained without intravenous contrast.  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 No other area of acute infarct identified. This area of restricted diffusion has become considerably more apparent and larger compared with the MRI earlier today.  IMPRESSION: Acute infarct left posterior lateral medulla.  Electronically Signed: By: Franchot Gallo M.D. On: 05/20/2014 18:22   Ir Angio Vertebral Sel Vertebral Uni R Mod Sed  05/21/2014   CLINICAL DATA:  Vertebrobasilar ischemia. Occluded left vertebral artery.  EXAM: IR ANGIO VERTEBRAL SEL VERTEBRAL UNI RIGHT MOD SED RIGHT VERTEBRAL ARTERY ANGIOGRAM:  ANESTHESIA/SEDATION: General anesthesia.  MEDICATIONS: As per general anesthesia.  CONTRAST:  81mL OMNIPAQUE IOHEXOL 300 MG/ML  SOLN  PROCEDURE: Following full explanation of the procedure along with the potential associated complications, an informed witnessed consent was obtained.  The patient was put under general anesthesia by the Department of Anesthesiology at [facility].  The right antecubital fossa was prepped and draped in the usual sterile fashion.  Thereafter using a modified Seldinger technique, with ultrasound guidance, access into the right brachial artery was then obtained  without difficulty. Over a 0.035 inch guidewire, a 5 French Pinnacle sheath was inserted.  Through this, a 5 [place] JB1 catheter was  then advanced and positioned in the right vertebral artery in its proximal 1/3 using biplane roadmap technique and constant fluoroscopic guidance. Arteriograms were then performed centered over the intracranial compartment.  COMPLICATIONS: None immediate.  FINDINGS: The right vertebral artery injection demonstrates the origin of the right vertebral artery to be normal.  The vessel is otherwise seen to opacify normally to the cranial skull base.  Opacification of the right vertebrobasilar junction is seen proximally. The right posterior-inferior cerebellar artery is seen to opacify proximally with a tapered severe stenosis associated with complete occlusion.  Distal to this, the right vertebrobasilar junction demonstrates a right anterior-inferior cerebellar artery which appears to opacify the right posterior-inferior cerebellar artery distribution.  Severe narrowing of the right vertebrobasilar junction is seen proximal to the confluence with the contralateral vertebral artery to form the basilar artery. The basilar artery itself proximally just distal to the confluence has a severe 95-97% stenosis with antegrade flow more distally to supply the left anterior-inferior cerebellar artery, and the superior cerebellar artery. Transit opacification of the posterior communicating artery is seen on the right.  Also seen is retrograde opacification of the left vertebrobasilar junction with opacification of the left posterior-inferior cerebellar artery.  There is no clearance of contrast antegradely in the left vertebrobasilar junction consistent with a complete angiographic occlusion.  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  Severe pre-occlusive stenosis of the right vertebrobasilar junction just proximal to the confluence to form the basilar artery.  95-97% stenosis of the proximal basilar artery just distal to the confluence.  Retrograde opacification of the left  vertebrobasilar junction and left posterior-inferior cerebellar artery from the right vertebral artery injection.   Electronically Signed   By: Luanne Bras M.D.   On: 05/21/2014 11:30    Assessment/Plan: Diagnosis:Left posterior medullary infarct 1. Does the need for close, 24 hr/day medical supervision in concert with the patient's rehab needs make it unreasonable for this patient to be served in a less intensive setting? Yes 2. Co-Morbidities requiring supervision/potential complications: basilar artery stenosis 3. Due to bladder management, bowel management, safety, skin/wound care, disease management, medication administration, pain management and patient education, does the patient require 24 hr/day rehab nursing? Yes 4. Does the patient require coordinated care of a physician, rehab nurse, PT (1-2 hrs/day, 5 days/week), OT (1-2 hrs/day, 5 days/week) and SLP (.5-1 hrs/day, 5 days/week) to address physical and functional deficits in the context of the above medical diagnosis(es)? Yes Addressing deficits in the following areas: balance, endurance, locomotion, strength, transferring, bowel/bladder control, bathing, dressing, feeding, grooming, toileting, cognition, speech and language 5. Can the patient actively participate in an intensive therapy program of at least 3 hrs of therapy per day at least 5 days per week? Yes 6. The potential for patient to make measurable gains while on inpatient rehab is excellent 7. Anticipated functional outcomes upon discharge from inpatient rehab are supervision  with PT, supervision with OT, modified independent with SLP. 8. Estimated rehab length of stay to reach the above functional goals is: 10-14d 9. Does the patient have adequate social supports and living environment to accommodate these discharge functional goals? Yes 10. Anticipated D/C setting: Home 11. Anticipated post D/C treatments: Clifford therapy 12. Overall Rehab/Functional Prognosis:  good  RECOMMENDATIONS: This patient's condition is appropriate for continued rehabilitative care in the following setting: CIR Patient has  agreed to participate in recommended program. Yes Note that insurance prior authorization may be required for reimbursement for recommended care.  Comment: Discussed case with Dr. Erlinda Hong  no plans for additional endovascular procedures    05/22/2014

## 2014-05-22 NOTE — Progress Notes (Signed)
Rehab admissions - Evaluated for possible admission.  I met with patient and his wife at the bedside.  I gave them rehab booklets.  I have called [employer] and have opened the case requesting acute inpatient rehab admission.  I will need OT eval completed prior to getting insurance authorization.  I will follow up in am.  Call me for questions.  #[phone]

## 2014-05-22 NOTE — Progress Notes (Addendum)
STROKE TEAM PROGRESS NOTE   HISTORY OF PRESENT ILLNESS Alejandro Schneider is an 64 y.o. male who reports that he went to bed normal last night. At about 0130 this morning awakened and noted a area of numbness along the left side of his nose and onto the top of his lip on the left. Then when the patient got up he was off balance and had difficulty walking. He reports dizziness that he describes as a "wooziness" that he experiences on siting and standing. Initial NIHSS of 1.   SUBJECTIVE (INTERVAL HISTORY) His wife is at the bedside.  Overall he feels his condition is gradually improving. No nausea or vomiting. Dizziness getting better. Sitting in chair. He continues to complain of hiccups.   OBJECTIVE Temp:  [98.3 F (36.8 C)-99.7 F (37.6 C)] 98.7 F (37.1 C) (02/17 1041) Pulse Rate:  [61-78] 61 (02/17 1041) Cardiac Rhythm:  [-] Normal sinus rhythm (02/17 0757) Resp:  [11-21] 18 (02/17 1041) BP: (125-159)/(69-93) 159/93 mmHg (02/17 1041) SpO2:  [96 %-100 %] 98 % (02/17 1041)   Recent Labs Lab 05/20/14 0336  GLUCAP 150*    Recent Labs Lab 05/20/14 0306 05/20/14 0313 05/21/14 0234  NA 139 141 137  K 3.7 3.7 3.6  CL 108 105 105  CO2 26  --  24  GLUCOSE 164* 161* 114*  BUN 19 21 9   CREATININE 0.93 0.80 0.82  CALCIUM 8.6  --  8.2*    Recent Labs Lab 05/20/14 0306 05/21/14 0234  AST 40* 26  ALT 29 23  ALKPHOS 62 59  BILITOT 0.8 0.8  PROT 6.6 6.1  ALBUMIN 3.8 3.5    Recent Labs Lab 05/20/14 0306 05/20/14 0313 05/21/14 0234  WBC 7.4  --  7.7  NEUTROABS 5.7  --  5.6  HGB 14.3 15.0 13.5  HCT 41.5 44.0 39.0  MCV 89.8  --  88.6  PLT 169  --  176   No results for input(s): CKTOTAL, CKMB, CKMBINDEX, TROPONINI in the last 168 hours.  Recent Labs  05/20/14 0306 05/21/14 0234  LABPROT 13.2 13.7  INR 0.99 1.04   No results for input(s): COLORURINE, LABSPEC, PHURINE, GLUCOSEU, HGBUR, BILIRUBINUR, KETONESUR, PROTEINUR, UROBILINOGEN, NITRITE, LEUKOCYTESUR in  the last 72 hours.  Invalid input(s): APPERANCEUR     Component Value Date/Time   CHOL 189 05/21/2014 0234   TRIG 148 05/21/2014 0234   HDL 38* 05/21/2014 0234   CHOLHDL 5.0 05/21/2014 0234   VLDL 30 05/21/2014 0234   LDLCALC 121* 05/21/2014 0234   Lab Results  Component Value Date   HGBA1C 5.6 05/21/2014   No results found for: LABOPIA, COCAINSCRNUR, LABBENZ, AMPHETMU, THCU, LABBARB  No results for input(s): ETH in the last 168 hours.  I have personally reviewed the radiological images below and agree with the radiology interpretations.  Ct Angio Head and neck W/cm &/or Wo Cm  05/20/2014  IMPRESSION: Occluded LEFT vertebral artery, with minimal reconstitution within the foraminal segments. Occlusion of the distal LEFT V3 segment through the intracranial level.  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 However, there is very regular distal RIGHT V4 segment and, apparent included RIGHT posterior inferior cerebellar artery.  High-grade stenosis versus occluded proximal basilar artery, small amount of contrast in the mid to distal basilar artery may represent retrograde flow, complete circle of Willis noted.  Moderately irregular intracranial vessels suggests atherosclerosis, less  likely vasculopathy. Patent posterior cerebral arteries though, less robust flow related enhancement of the LEFT mid to distal PCA segments.      Ct Head (brain) Wo Contrast  05/20/2014   IMPRESSION: Equivocal hyperdense right MCA. No intracranial hemorrhage. Otherwise normal brain.    Mri and Mra Head Wo Contrast  05/20/2014   IMPRESSION: MRI HEAD: No acute intracranial process ; normal noncontrast MRI of the brain for age.  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  MRA HEAD: 3 mm laterally directed outpouching of LEFT  cavernous carotid artery segments, considering proximity to the supraclinoid artery, this is less likely a para ophthalmic aneurysm.  LEFT vertebral artery occlusion. In addition, thready irregular distal RIGHT vertebral artery and high-grade stenosis of the proximal basilar artery. 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  Constellation of findings would be better characterized on CT angiogram of the head and neck.    [name] W/o Cm  05/20/2014   IMPRESSION: Acute infarct left posterior lateral medulla.    Ir Angiogram  05/20/2014   IMPRESSION: Occluded left vertebral artery in its entirety.  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 Antegrade flow is seen in the basilar artery to the level of the superior cerebellar arteries.  Both posterior communicating arteries open.    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 2.Retrograde opacification of LT VBJ from hypoplastic RT VA  TCD - right vert and BA antegrade flow  2D Echocardiogram  - Left ventricle: The cavity size was normal. Wall thickness was normal. Systolic function was normal. The estimated ejection fraction was in the range of 60% to 65%. Doppler parameters are consistent with abnormal left ventricular relaxation (grade 1 diastolic dysfunction).   PHYSICAL EXAM  Temp:  [98.3 F (36.8 C)-99.7 F (37.6 C)] 98.7 F (37.1 C) (02/17 1041) Pulse Rate:  [61-78] 61 (02/17 1041) Resp:  [11-21] 18 (02/17 1041) BP: (125-159)/(69-93) 159/93 mmHg (02/17 1041) SpO2:  [96 %-100 %] 98 % (02/17 1041)  General - Well nourished, well developed, in no apparent distress.  Ophthalmologic - fundi not visualized due to photophobia.  Cardiovascular - Regular rate and rhythm with no murmur.  Neck - supple, no carotid bruits  Mental Status -  Level of arousal and orientation to  time, place, and person were intact. Language including expression, naming, repetition, comprehension was assessed and found intact.  Cranial Nerves II - XII - II - Visual field intact OU. III, IV, VI - Extraocular movements intact. V - Facial sensation decreased on the left V2 V3. VII - Facial movement intact bilaterally. VIII - Hearing & vestibular intact bilaterally, no nystagmus. X - Palate elevates symmetrically. XI - Chin turning & shoulder shrug intact bilaterally. XII - Tongue protrusion intact.  Motor Strength - The patient's strength was normal in all extremities and pronator drift was absent.  Bulk was normal and fasciculations were absent.   Motor Tone - Muscle tone was assessed at the neck and appendages and was normal.  Reflexes - The patient's reflexes were symmetrical in all extremities and he had no pathological reflexes.  Sensory - Light touch, temperature/pinprick were assessed and were symmetrical.    Coordination - The patient had mild ataxia or dysmetria on left hand FTN.  Tremor was absent.  Gait and Station - not tested due to dizziness.   ASSESSMENT/PLAN Mr. ASAIAH SCARBER  is a 64 y.o. male with history of HLD admitted for acute onset dizziness, off balance, left face numbness, and vomiting. Symptoms gradually improved.    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  MRI  left lateral medullary infarct  MRA  left vertebral artery occlusion, right vertebral artery distal high-grade stenosis, basilar artery proximal high-grade stenosis.  CTA head and neck - similar as MRA, no significant carotid artery stenosis  2D Echo  unremarkable  LDL 121, not at goal  HgbA1c 5.6  Lovenox for VTE prophylaxis  Diet Heart   no antithrombotic prior to admission, now on aspirin 81 mg orally every day and clopidogrel 75 mg orally every day. Will continue dural antiplatelet for 3  months and then Plavix alone.  Patient counseled to be compliant with his antithrombotic medications  Ongoing aggressive stroke risk factor management  Therapy recommendations:  CIR  Disposition:  Inpatient rehabilitation  Hypertension  Home meds:   None  Currently on no blood pressure medications  Stable  Blood pressure goal 130-150 for at least 2-3 weeks to avoid posterior circulation hypoperfusion  Hyperlipidemia  Home meds:  None   LDL 121, goal < 70  Add Lipitor 20  Continue statin at discharge  Hiccups:  Most likely secondary to his recent brainstem stroke.  Assured patient that hiccups will resolve over time.  Will add Thorazine 25 mg 3 times a day  Continue baclofen 10mg  tid.  Other Stroke Risk Factors  Advanced age  Obesity, Body mass index is 29.99 kg/(m^2).   Hospital day # 2  Patient's chart and images have been reviewed. Patient seen and discussed with Dr. Erlinda Hong.  Lanelle Bal, PA-C Department neurology [facility]  I, the attending vascular neurologist, have personally obtained a history, examined the patient, evaluated laboratory data, individually viewed imaging studies and agree with radiology interpretations. Together with the NP/PA, we formulated the assessment and plan of care which reflects our mutual decision.  I have made any additions or clarifications directly to the above note and agree with the findings and plan as currently documented.   64 yo M with PMH of HLD admitted for left Wallenberg syndrome. MRA showed poor posterior circulation due to left VA occlusion, right VA and BA high grade stenosis. On dural antiplatelet, and symptoms better. Still has intractable hiccup. On baclofen and add thorazine. Avoid hypotension. waiting for CIR.    Rosalin Hawking, MD PhD Stroke Neurology 05/22/2014 9:29 PM       Rosalin Hawking, MD PhD Stroke Neurology 05/22/2014 1:12 PM    To contact Stroke Continuity provider, please  refer to [email or web address] After hours, contact General Neurology

## 2014-05-22 NOTE — Progress Notes (Signed)
Rehab Admissions Coordinator Note:  Patient was screened by Florencia Zaccaro L for appropriateness for an Inpatient Acute Rehab Consult.  At this time, we are recommending Inpatient Rehab consult.  Warda Mcqueary L 05/22/2014, 10:40 AM  I can be reached at [phone].

## 2014-05-22 NOTE — Evaluation (Signed)
Physical Therapy Evaluation Patient Details Name: Alejandro Schneider MRN: [id number] DOB: [birth year] Today's Date: 05/22/2014   History of Present Illness  Alejandro Schneider is an 64 y.o. male who was admitted 2/15 with  noted area of numbness along the left side of his nose and onto the top of his lip on the left. Then when the patient got up he was off balance and had difficulty walking. 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  Clinical Impression  Pt admitted with above and presenting with significantly impaired balance, L UE ataxia/dysmetria, visual changes and dizziness. Pt requiring modA for safe ambulation at this time. Pt demo's excellent rehab potential and would be an excellent CIR candidate to achieve maximal independence with mobility and ADLs. Pt aware he is unsafe to return home in this condition and desires aggressive therapy to achieve maximal functional recovery and independence.     Follow Up Recommendations CIR    Equipment Recommendations  None recommended by PT    Recommendations for Other Services Rehab consult     Precautions / Restrictions Precautions Precautions: Fall Precaution Comments: pt with strong list to L in sitting and amb, pt aware Restrictions Weight Bearing Restrictions: No      Mobility  Bed Mobility Overal bed mobility: Needs Assistance Bed Mobility: Supine to Sit     Supine to sit: Min guard     General bed mobility comments: increased time but able to log roll and push self up into sitting  Transfers Overall transfer level: Needs assistance Equipment used: None;Rolling walker (2 wheeled) Transfers: Sit to/from Stand Sit to Stand: Min assist;Mod assist         General transfer comment: pt modA when transitioning into standing without RW due to instability and strong lean to L. minA with RW due to increased support from  RW.  Ambulation/Gait Ambulation/Gait assistance: +2 safety/equipment;Mod assist Ambulation Distance (Feet): 10 Feet (x2) Assistive device: Rolling walker (2 wheeled);None Gait Pattern/deviations: Step-to pattern;Decreased stride length;Drifts right/left Gait velocity: slow   General Gait Details: attempted to ambulate without RW however pt with strong lean to left requiring modA to maintain upright posture. pt with scissored gait pattern and c/o dizziness of 5/10. pt with slightly improved with RW however con't to lean Left and even tips RW. Pt aware but unable to self correct  Stairs            Wheelchair Mobility    Modified Rankin (Stroke Patients Only) Modified Rankin (Stroke Patients Only) Pre-Morbid Rankin Score: No symptoms Modified Rankin: Moderately severe disability     Balance Overall balance assessment: Needs assistance Sitting-balance support: Feet supported;No upper extremity supported Sitting balance-Leahy Scale: Fair Sitting balance - Comments: pt however lost balance when attempted to put socks on at EOB Postural control: Left lateral lean Standing balance support: Bilateral upper extremity supported Standing balance-Leahy Scale: Poor Standing balance comment: pt at significant falls risk                             Pertinent Vitals/Pain Pain Assessment: No/denies pain    Home Living Family/patient expects to be discharged to:: Inpatient rehab                 Additional Comments: lives with wife    Prior Function Level of Independence: Independent         Comments:  works as Clinical cytogeneticist for [employer]     [place]: Right    Extremity/Trunk Assessment   Upper Extremity Assessment: LUE deficits/detail       LUE Deficits / Details: shld 4-/5, per pt "I have a bad shoulder"   Lower Extremity Assessment: Overall WFL for tasks assessed      Cervical / Trunk Assessment: Normal  Communication    Communication: No difficulties  Cognition Arousal/Alertness: Awake/alert Behavior During Therapy: WFL for tasks assessed/performed Overall Cognitive Status: Within Functional Limits for tasks assessed                      General Comments General comments (skin integrity, edema, etc.): pt with noted visual deficits. c/o blurred and inconsistent double vision. pt was able to demo good tracking and smoot pursuits.    Exercises        Assessment/Plan    PT Assessment Patient needs continued PT services  PT Diagnosis Difficulty walking   PT Problem List Decreased strength;Decreased range of motion;Decreased activity tolerance;Decreased balance;Decreased mobility;Decreased coordination  PT Treatment Interventions DME instruction;Gait training;Stair training;Functional mobility training;Therapeutic activities;Therapeutic exercise;Balance training;Neuromuscular re-education   PT Goals (Current goals can be found in the Care Plan section) Acute Rehab PT Goals Patient Stated Goal: walk normal again PT Goal Formulation: With patient Time For Goal Achievement: 06/05/14 Potential to Achieve Goals: Good    Frequency Min 4X/week   Barriers to discharge        Co-evaluation               End of Session Equipment Utilized During Treatment: Gait belt Activity Tolerance: Patient tolerated treatment well Patient left: in chair;with call bell/phone within reach;with family/visitor present Nurse Communication: Mobility status         Time: 0812-0840 PT Time Calculation (min) (ACUTE ONLY): 28 min   Charges:   PT Evaluation $Initial PT Evaluation Tier I: 1 Procedure PT Treatments $Gait Training: 8-22 mins   PT G CodesKingsley Callander 05/22/2014, 9:53 AM  Kittie Plater, PT, DPT Pager #: [phone] Office #: [phone]

## 2014-05-22 NOTE — Progress Notes (Signed)
Referring Physician(s): Dr Erlinda Hong  Subjective:  R vertebral and basilar artery stenosis Now on ASA/Plavix Hope to re attempt pta/stent in few weeks Plan for possible rehab eval today Still weak; blurry vision Leans to left and feels unstable  Allergies: Bee venom  Medications: Prior to Admission medications   Medication Sig Start Date End Date Taking? Authorizing Provider  Multiple Vitamins-Minerals (MULTIVITAMIN,TX-MINERALS) tablet Take 1 tablet by mouth daily.     Yes Historical Provider, MD  clotrimazole-betamethasone (LOTRISONE) cream Apply 1 application topically 2 (two) times daily. Patient not taking: Reported on 05/20/2014 04/09/14   Tonia Ghent, MD     Vital Signs: BP 146/78 mmHg  Pulse 65  Temp(Src) 98.3 F (36.8 C) (Oral)  Resp 18  Ht 5\' 10"  (1.778 m)  Wt 94.802 kg (209 lb)  BMI 29.99 kg/m2  SpO2 98%  Physical Exam  Musculoskeletal:  Afeb; vss In bed and sleepy Rt arm site is NT; no bleeding Soft; no hematoma 2+ pulses  Nursing note and vitals reviewed.   Imaging: Ct Angio Head W/cm &/or Wo Cm  05/20/2014   CLINICAL DATA:  Stroke, vertigo. Initial symptoms, follow-up evaluation.  EXAM: CT ANGIOGRAPHY HEAD AND NECK  TECHNIQUE: Multidetector CT imaging of the head and neck was performed using the standard protocol during bolus administration of intravenous contrast. Multiplanar CT image reconstructions and MIPs were obtained to evaluate the vascular anatomy. Carotid stenosis measurements (when applicable) are obtained utilizing NASCET criteria, using the distal internal carotid diameter as the denominator.  CONTRAST:  55mL OMNIPAQUE IOHEXOL 350 MG/ML SOLN  COMPARISON:  MRI and MRA of the head May 20, 2014 at 4:08 a.m.  FINDINGS: CTA NECK  Normal appearance of the thoracic arch, normal branch pattern. The origins of the innominate, left Common carotid artery and subclavian artery are widely patent.  Bilateral Common carotid arteries are widely patent,  coursing in a straight line fashion. Mild eccentric intimal thickening. Normal appearance of the carotid bifurcations without hemodynamically significant stenosis by NASCET criteria. Normal appearance of the included internal carotid arteries.  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 Slight reconstitution of the proximal LEFT V2 segment via muscular branches with thready irregular flow through V2 segment, occluded distally. Mild extrinsic compression of the RIGHT vertebral artery due to degenerative change of the spine. However, there is apparent superimposed irregular distal RIGHT V2 segment, normal appearance of the RIGHT V3 segment.  No pseudoaneurysm.  No contrast extravasation.  Soft tissues are unremarkable. No acute osseous process though bone windows have not been submitted.  CTA HEAD  Anterior circulation: Normal appearance of the cervical internal carotid arteries, petrous, cavernous and supra clinoid internal carotid arteries. Widely patent anterior communicating artery. Normal appearance of the anterior and middle cerebral arteries. 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 Moderate anterior and middle cerebral arteries luminal regularity of the mid to distal branches likely represents atherosclerosis.  Posterior circulation: Absent LEFT vertebral artery. Thready irregular RIGHT V4 segment. The RIGHT posterior inferior cerebellar artery appears occluded with a 1 cm the origin. Thready irregular appearance of the RIGHT distal vertebral artery. No discernible contrast opacification the proximal basilar artery. The RIGHT the irregular flow the mid to distal basilar artery. Bilateral posterior communicating arteries are present in this may reflect retrograde flow. Less robust enhancement LEFT V2 and V3 segments, though appear patent.  No abnormal intracranial enhancement.  IMPRESSION: Occluded LEFT vertebral artery, with minimal  reconstitution within the foraminal segments.  Occlusion of the distal LEFT V3 segment through the intracranial level.  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 However, there is very regular distal RIGHT V4 segment and, apparent included RIGHT posterior inferior cerebellar artery.  High-grade stenosis versus occluded proximal basilar artery, small amount of contrast in the mid to distal basilar artery may represent retrograde flow, complete circle of Willis noted.  Moderately irregular intracranial vessels suggests atherosclerosis, less likely vasculopathy. Patent posterior cerebral arteries though, less robust flow related enhancement of the LEFT mid to distal PCA segments.   Electronically Signed   By: Elon Alas   On: 05/20/2014 05:46   Ct Head (brain) Wo Contrast  05/20/2014   CLINICAL DATA:  Left-sided numbness in the face.  EXAM: CT HEAD WITHOUT CONTRAST  TECHNIQUE: Contiguous axial images were obtained from the base of the skull through the vertex without intravenous contrast.  COMPARISON:  None.  FINDINGS: There is no intracranial hemorrhage or mass. There is no extra-axial fluid collection. There is no evidence of acute infarction, with the exception of an equivocal hyperdense right middle cerebral artery. The ventricles are midline and symmetric. Basal cisterns are patent. The brain is otherwise unremarkable in appearance. Mild membrane thickening is incidentally noted in the ethmoid air cells and maxillary sinuses, with no significant acute bony abnormality.  IMPRESSION: Equivocal hyperdense right MCA. No intracranial hemorrhage. Otherwise normal brain.  Critical Value/emergent results were called by telephone at the time of interpretation on 05/20/2014 at 3:25 am to Dr. Quintella Reichert , who verbally acknowledged these results.    Electronically Signed   By: Andreas Newport M.D.   On: 05/20/2014 03:25   Ct Angio Neck W/cm &/or Wo/cm  05/20/2014   CLINICAL DATA:  Stroke, vertigo. Initial symptoms, follow-up evaluation.  EXAM: CT ANGIOGRAPHY HEAD AND NECK  TECHNIQUE: Multidetector CT imaging of the head and neck was performed using the standard protocol during bolus administration of intravenous contrast. Multiplanar CT image reconstructions and MIPs were obtained to evaluate the vascular anatomy. Carotid stenosis measurements (when applicable) are obtained utilizing NASCET criteria, using the distal internal carotid diameter as the denominator.  CONTRAST:  73mL OMNIPAQUE IOHEXOL 350 MG/ML SOLN  COMPARISON:  MRI and MRA of the head May 20, 2014 at 4:08 a.m.  FINDINGS: CTA NECK  Normal appearance of the thoracic arch, normal branch pattern. The origins of the innominate, left Common carotid artery and subclavian artery are widely patent.  Bilateral Common carotid arteries are widely patent, coursing in a straight line fashion. Mild eccentric intimal thickening. Normal appearance of the carotid bifurcations without hemodynamically significant stenosis by NASCET criteria. Normal appearance of the included internal carotid arteries.  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 Slight reconstitution of the proximal LEFT V2 segment via muscular branches with thready irregular flow through V2 segment, occluded distally. Mild extrinsic compression of the RIGHT vertebral artery due to degenerative change of the spine. However, there is apparent superimposed irregular distal RIGHT V2 segment, normal appearance of the RIGHT V3 segment.  No pseudoaneurysm.  No contrast extravasation.  Soft tissues are unremarkable. No acute osseous process though bone windows have not been submitted.  CTA HEAD  Anterior circulation: Normal appearance of the cervical internal carotid arteries, petrous, cavernous and supra clinoid internal carotid arteries.  Widely patent anterior communicating artery. Normal appearance of the anterior and middle cerebral arteries. 3 mm laterally directed  LEFT cavernous carotid artery aneurysm versus meningohypophyseal infundibulum. Moderate anterior and middle cerebral arteries luminal regularity of the mid to distal branches likely represents atherosclerosis.  Posterior circulation: Absent LEFT vertebral artery. Thready irregular RIGHT V4 segment. The RIGHT posterior inferior cerebellar artery appears occluded with a 1 cm the origin. Thready irregular appearance of the RIGHT distal vertebral artery. No discernible contrast opacification the proximal basilar artery. The RIGHT the irregular flow the mid to distal basilar artery. Bilateral posterior communicating arteries are present in this may reflect retrograde flow. Less robust enhancement LEFT V2 and V3 segments, though appear patent.  No abnormal intracranial enhancement.  IMPRESSION: Occluded LEFT vertebral artery, with minimal reconstitution within the foraminal segments. Occlusion of the distal LEFT V3 segment through the intracranial level.  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 However, there is very regular distal RIGHT V4 segment and, apparent included RIGHT posterior inferior cerebellar artery.  High-grade stenosis versus occluded proximal basilar artery, small amount of contrast in the mid to distal basilar artery may represent retrograde flow, complete circle of Willis noted.  Moderately irregular intracranial vessels suggests atherosclerosis, less likely vasculopathy. Patent posterior cerebral arteries though, less robust flow related enhancement of the LEFT mid to distal PCA segments.   Electronically Signed   By: Elon Alas   On: 05/20/2014 05:46   Mr Jodene Nam Head Wo Contrast  05/20/2014   CLINICAL DATA:   LEFT facial numbness, headache and difficulty walking beginning at 1 a.m. history of hyperlipidemia and lumbar laminectomy.  EXAM: MRI HEAD WITHOUT CONTRAST  MRA HEAD WITHOUT CONTRAST  TECHNIQUE: Multiplanar, multiecho pulse sequences of the brain and surrounding structures were obtained without intravenous contrast. Angiographic images of the head were obtained using MRA technique without contrast.  COMPARISON:  CT of the head May 20, 2014 at 3:18 hours  FINDINGS: MRI HEAD FINDINGS  No reduced diffusion to suggest acute ischemia. No susceptibility artifact to suggest hemorrhage. Ventricles and sulci are normal for patient's age. Tiny linear T2 hyperintensity, low T1 signal on the LEFT cerebellum. No midline shift, mass effect or mass lesions. A few tiny supratentorial white matter T2 hyperintensities, less than expected for age.  No abnormal extra-axial fluid collections. Loss of LEFT vertebral artery flow void on the coronal T2 an, axial T2 to/24. Moderate paranasal sinus mucosal thickening, atretic LEFT maxillary sinus most consistent with chronic sinusitis disc, no acute component. Mastoid air cells are well aerated. Ocular globes and orbital contents are unremarkable. No abnormal sellar expansion. No cerebellar tonsillar ectopia. No suspicious calvarial bone marrow signal.  MRA HEAD FINDINGS  Anterior circulation: Normal flow related enhancement of the included cervical, petrous, cavernous and supra clinoid internal carotid arteries. Patent anterior communicating artery. Normal flow related enhancement of the anterior and middle cerebral arteries, including more distal segments.  No large vessel occlusion, high-grade stenosis, abnormal luminal irregularity. 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 However, tortuous LEFT carotid siphon.  Posterior circulation: Absent flow related enhancement LEFT vertebral artery. Normal flow related enhancement of the RIGHT vertebral artery  proximal V4 segment, however there is suspected slow flow versus occlusion of RIGHT posterior inferior cerebellar artery. High-grade stenosis of proximal basilar artery, with robust bilateral posterior communicating arteries present. Patent bilateral posterior cerebral arteries, however there is faint flow related enhancement LEFT P2/3 segments.  No large vessel occlusion, aneurysm.  IMPRESSION: MRI HEAD: No acute  intracranial process ; normal noncontrast MRI of the brain for age.  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  MRA HEAD: 3 mm laterally directed outpouching of LEFT cavernous carotid artery segments, considering proximity to the supraclinoid artery, this is less likely a para ophthalmic aneurysm.  LEFT vertebral artery occlusion. In addition, thready irregular distal RIGHT vertebral artery and high-grade stenosis of the proximal basilar artery. 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  Constellation of findings would be better characterized on CT angiogram of the head and neck.   Electronically Signed   By: Elon Alas   On: 05/20/2014 04:46   Mr Brain Wo Contrast  05/20/2014   CLINICAL DATA:  LEFT facial numbness, headache and difficulty walking beginning at 1 a.m. history of hyperlipidemia and lumbar laminectomy.  EXAM: MRI HEAD WITHOUT CONTRAST  MRA HEAD WITHOUT CONTRAST  TECHNIQUE: Multiplanar, multiecho pulse sequences of the brain and surrounding structures were obtained without intravenous contrast. Angiographic images of the head were obtained using MRA technique without contrast.  COMPARISON:  CT of the head May 20, 2014 at 3:18 hours  FINDINGS: MRI HEAD FINDINGS  No reduced diffusion to suggest acute ischemia. No susceptibility artifact to suggest hemorrhage. Ventricles and sulci are normal for patient's age. Tiny linear T2 hyperintensity, low T1 signal on the LEFT cerebellum. No midline  shift, mass effect or mass lesions. A few tiny supratentorial white matter T2 hyperintensities, less than expected for age.  No abnormal extra-axial fluid collections. Loss of LEFT vertebral artery flow void on the coronal T2 an, axial T2 to/24. Moderate paranasal sinus mucosal thickening, atretic LEFT maxillary sinus most consistent with chronic sinusitis disc, no acute component. Mastoid air cells are well aerated. Ocular globes and orbital contents are unremarkable. No abnormal sellar expansion. No cerebellar tonsillar ectopia. No suspicious calvarial bone marrow signal.  MRA HEAD FINDINGS  Anterior circulation: Normal flow related enhancement of the included cervical, petrous, cavernous and supra clinoid internal carotid arteries. Patent anterior communicating artery. Normal flow related enhancement of the anterior and middle cerebral arteries, including more distal segments.  No large vessel occlusion, high-grade stenosis, abnormal luminal irregularity. 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 However, tortuous LEFT carotid siphon.  Posterior circulation: Absent flow related enhancement LEFT vertebral artery. Normal flow related enhancement of the RIGHT vertebral artery proximal V4 segment, however there is suspected slow flow versus occlusion of RIGHT posterior inferior cerebellar artery. High-grade stenosis of proximal basilar artery, with robust bilateral posterior communicating arteries present. Patent bilateral posterior cerebral arteries, however there is faint flow related enhancement LEFT P2/3 segments.  No large vessel occlusion, aneurysm.  IMPRESSION: MRI HEAD: No acute intracranial process ; normal noncontrast MRI of the brain for age.  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  MRA HEAD: 3 mm laterally directed outpouching of LEFT cavernous carotid artery segments, considering proximity to the supraclinoid artery, this is less likely a  para ophthalmic aneurysm.  LEFT vertebral artery occlusion. In addition, thready irregular distal RIGHT vertebral artery and high-grade stenosis of the proximal basilar artery. 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  Constellation of findings would be better characterized on CT angiogram of the head and neck.   Electronically Signed   By: Elon Alas   On: 05/20/2014 04:46   Ir Angiogram Extremity Left  05/20/2014   CLINICAL DATA:  Diplopia. Dysarthria. Gait ataxia.  Vertebrobasilar insufficiency.  EXAM: BILATERAL COMMON CAROTID AND INNOMINATE ANGIOGRAPHY AND BILATERAL VERTEBRAL ARTERY ANGIOGRAMS  PROCEDURE: Contrast: 17mL OMNIPAQUE IOHEXOL 300 MG/ML  SOLN  Anesthesia/Sedation:  Conscious sedation.  Medications: Versed  0.5 mg IV.  Fentanyl 25 mcg IV.  Following a full explanation of the procedure along with the potential associated complications, an informed witnessed consent was obtained.  The right groin was prepped and draped in the usual sterile fashion. Thereafter using modified Seldinger technique, transfemoral access into the right common femoral artery was obtained without difficulty. Over a 0.035 inch guidewire, a 5 French Pinnacle sheath was inserted. Through this, and also over 0.035 inch guidewire, a 5 French JB1 catheter was advanced to the aortic arch region and selectively positioned in the right common carotid artery, the right subclavian artery, the left common carotid artery and the left subclavian artery.  There were no acute complications. The patient tolerated the procedure well.  FINDINGS: The right vertebral artery origin is normal.  The vessel has mild tortuosity proximally. More distally the vessel is seen to opacify to the cranial skull base. In the region of the right posterior-inferior cerebellar artery there is a severe focal stenosis with antegrade flow noted distally to the level of the superior cerebral arteries.  Poor filling of the basilar artery proximally is seen. Both anterior inferior cerebellar arteries are seen to opacify.  The right common carotid arteriogram demonstrates mild narrowing of the right external carotid artery just distal to its origin.  More distally the vessel and its branches, otherwise, opacify normally.  The right internal carotid artery at the bulb to the cranial skull base also opacifies normally.  The petrous, the cavernous and the supraclinoid segments are widely patent.  The right middle and the right anterior cerebral arteries opacify normally into the capillary and venous phases.  Opacification via the right posterior communicating artery of the right posterior cerebral artery and the left posterior cerebral artery is noted. Also demonstrated is retrograde opacification of the distal basilar artery to the level of the superior cerebellar arteries with antegrade unopacified blood also noted suggesting flow from more proximal vessel.  The left common carotid arteriogram demonstrates the left external carotid artery and its major branches to be widely patent.  The left internal carotid artery at the bulb to the cranial skull base opacifies normally.  The petrous, the cavernous and the supraclinoid segments are widely patent.  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  More distally there is opacification of the left posterior communicating artery with subsequent opacification of the left posterior cerebral artery distribution. Again demonstrated is retrograde opacification of the distal basilar artery.  The left middle and the left anterior cerebral arteries opacify normally into the capillary and venous phases.  Transient opacification via the anterior communicating artery of the right anterior cerebral artery  is noted.  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  The left vein of Labbe is seen to drain into the transverse sinus/sigmoid sinus junction with flow distally noted into the internal jugular vein.  The left subclavian arteriogram demonstrates non-visualization of the left vertebral artery and the neck and also at the cranial skull base.  The thyrocervical trunk branches are normal.  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 Antegrade flow is seen in the basilar artery to the level of the superior cerebellar arteries.  Both posterior communicating arteries open.   Electronically Signed   By: Luanne Bras M.D.   On: 05/20/2014 11:14   Ir US Guide Vasc Access Right  05/21/2014   CLINICAL DATA:  Vertebrobasilar ischemia. Occluded left vertebral artery.  EXAM: IR ANGIO VERTEBRAL SEL VERTEBRAL UNI RIGHT MOD SED RIGHT VERTEBRAL ARTERY ANGIOGRAM:  ANESTHESIA/SEDATION: General anesthesia.  MEDICATIONS: As per general anesthesia.  CONTRAST:  53mL OMNIPAQUE IOHEXOL 300 MG/ML  SOLN  PROCEDURE: Following full explanation of the procedure along with the potential associated complications, an informed witnessed consent was obtained.  The patient was put under general anesthesia by the Department of Anesthesiology at [facility].  The right antecubital fossa was prepped and draped in the usual sterile fashion.  Thereafter using a modified Seldinger technique, with ultrasound guidance, access into the right brachial artery was then obtained without difficulty. Over a 0.035 inch guidewire, a 5 French Pinnacle sheath was inserted.  Through this, a 5 [place] JB1 catheter was then advanced and positioned in the right vertebral artery in its proximal 1/3 using biplane roadmap  technique and constant fluoroscopic guidance. Arteriograms were then performed centered over the intracranial compartment.  COMPLICATIONS: None immediate.  FINDINGS: The right vertebral artery injection demonstrates the origin of the right vertebral artery to be normal.  The vessel is otherwise seen to opacify normally to the cranial skull base.  Opacification of the right vertebrobasilar junction is seen proximally. The right posterior-inferior cerebellar artery is seen to opacify proximally with a tapered severe stenosis associated with complete occlusion.  Distal to this, the right vertebrobasilar junction demonstrates a right anterior-inferior cerebellar artery which appears to opacify the right posterior-inferior cerebellar artery distribution.  Severe narrowing of the right vertebrobasilar junction is seen proximal to the confluence with the contralateral vertebral artery to form the basilar artery. The basilar artery itself proximally just distal to the confluence has a severe 95-97% stenosis with antegrade flow more distally to supply the left anterior-inferior cerebellar artery, and the superior cerebellar artery. Transit opacification of the posterior communicating artery is seen on the right.  Also seen is retrograde opacification of the left vertebrobasilar junction with opacification of the left posterior-inferior cerebellar artery.  There is no clearance of contrast antegradely in the left vertebrobasilar junction consistent with a complete angiographic occlusion.  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  Severe pre-occlusive stenosis of the right vertebrobasilar junction just proximal to the confluence to form the basilar artery.  95-97% stenosis of the proximal basilar artery just distal to the confluence.  Retrograde opacification of the left vertebrobasilar junction and left posterior-inferior cerebellar artery from the right vertebral artery injection.    Electronically Signed   By: Luanne Bras M.D.   On: 05/21/2014 11:30   Mr Henry W/o Cm  05/20/2014   ADDENDUM REPORT: 05/20/2014 18:27  ADDENDUM: Critical Value/emergent results were called by telephone at the time of interpretation on 05/20/2014 at 6:27 pm to Dr. Wallie Char , who verbally acknowledged these results.   Electronically Signed   By: Franchot Gallo M.D.   On: 05/20/2014 18:27   05/20/2014   CLINICAL DATA:  Stroke.  Vertigo.  EXAM: MRI HEAD WITHOUT CONTRAST  TECHNIQUE: Multiplanar, multiecho pulse sequences of the brain and  surrounding structures were obtained without intravenous contrast.  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 No other area of acute infarct identified. This area of restricted diffusion has become considerably more apparent and larger compared with the MRI earlier today.  IMPRESSION: Acute infarct left posterior lateral medulla.  Electronically Signed: By: Franchot Gallo M.D. On: 05/20/2014 18:22   Ir Angio Intra Extracran Sel Com Carotid Innominate Bilat Mod Sed  05/20/2014   CLINICAL DATA:  Diplopia. Dysarthria. Gait ataxia. Vertebrobasilar insufficiency.  EXAM: BILATERAL COMMON CAROTID AND INNOMINATE ANGIOGRAPHY AND BILATERAL VERTEBRAL ARTERY ANGIOGRAMS  PROCEDURE: Contrast: 71mL OMNIPAQUE IOHEXOL 300 MG/ML  SOLN  Anesthesia/Sedation:  Conscious sedation.  Medications: Versed  0.5 mg IV.  Fentanyl 25 mcg IV.  Following a full explanation of the procedure along with the potential associated complications, an informed witnessed consent was obtained.  The right groin was prepped and draped in the usual sterile fashion. Thereafter using modified Seldinger technique, transfemoral access into the right common femoral artery was obtained without difficulty. Over a 0.035 inch guidewire, a 5 French  Pinnacle sheath was inserted. Through this, and also over 0.035 inch guidewire, a 5 French JB1 catheter was advanced to the aortic arch region and selectively positioned in the right common carotid artery, the right subclavian artery, the left common carotid artery and the left subclavian artery.  There were no acute complications. The patient tolerated the procedure well.  FINDINGS: The right vertebral artery origin is normal.  The vessel has mild tortuosity proximally. More distally the vessel is seen to opacify to the cranial skull base. In the region of the right posterior-inferior cerebellar artery there is a severe focal stenosis with antegrade flow noted distally to the level of the superior cerebral arteries. Poor filling of the basilar artery proximally is seen. Both anterior inferior cerebellar arteries are seen to opacify.  The right common carotid arteriogram demonstrates mild narrowing of the right external carotid artery just distal to its origin.  More distally the vessel and its branches, otherwise, opacify normally.  The right internal carotid artery at the bulb to the cranial skull base also opacifies normally.  The petrous, the cavernous and the supraclinoid segments are widely patent.  The right middle and the right anterior cerebral arteries opacify normally into the capillary and venous phases.  Opacification via the right posterior communicating artery of the right posterior cerebral artery and the left posterior cerebral artery is noted. Also demonstrated is retrograde opacification of the distal basilar artery to the level of the superior cerebellar arteries with antegrade unopacified blood also noted suggesting flow from more proximal vessel.  The left common carotid arteriogram demonstrates the left external carotid artery and its major branches to be widely patent.  The left internal carotid artery at the bulb to the cranial skull base opacifies normally.  The petrous, the cavernous and  the supraclinoid segments are widely patent.  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  More distally there is opacification of the left posterior communicating artery with subsequent opacification of the left posterior cerebral artery distribution. Again demonstrated is retrograde opacification of the distal basilar artery.  The left middle and the left  anterior cerebral arteries opacify normally into the capillary and venous phases.  Transient opacification via the anterior communicating artery of the right anterior cerebral artery is noted.  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  The left vein of Labbe is seen to drain into the transverse sinus/sigmoid sinus junction with flow distally noted into the internal jugular vein.  The left subclavian arteriogram demonstrates non-visualization of the left vertebral artery and the neck and also at the cranial skull base.  The thyrocervical trunk branches are normal.  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 Antegrade flow is seen in the basilar artery to the level of the superior cerebellar arteries.  Both posterior communicating arteries open.   Electronically Signed   By: Luanne Bras M.D.   On: 05/20/2014 11:14   Ir Angio Vertebral Sel Subclavian Innominate Uni R Mod Sed  05/20/2014   CLINICAL DATA:  Diplopia. Dysarthria. Gait ataxia. Vertebrobasilar insufficiency.  EXAM: BILATERAL COMMON CAROTID AND INNOMINATE ANGIOGRAPHY AND BILATERAL VERTEBRAL ARTERY ANGIOGRAMS  PROCEDURE: Contrast: 69mL OMNIPAQUE IOHEXOL 300 MG/ML  SOLN   Anesthesia/Sedation:  Conscious sedation.  Medications: Versed  0.5 mg IV.  Fentanyl 25 mcg IV.  Following a full explanation of the procedure along with the potential associated complications, an informed witnessed consent was obtained.  The right groin was prepped and draped in the usual sterile fashion. Thereafter using modified Seldinger technique, transfemoral access into the right common femoral artery was obtained without difficulty. Over a 0.035 inch guidewire, a 5 French Pinnacle sheath was inserted. Through this, and also over 0.035 inch guidewire, a 5 French JB1 catheter was advanced to the aortic arch region and selectively positioned in the right common carotid artery, the right subclavian artery, the left common carotid artery and the left subclavian artery.  There were no acute complications. The patient tolerated the procedure well.  FINDINGS: The right vertebral artery origin is normal.  The vessel has mild tortuosity proximally. More distally the vessel is seen to opacify to the cranial skull base. In the region of the right posterior-inferior cerebellar artery there is a severe focal stenosis with antegrade flow noted distally to the level of the superior cerebral arteries. Poor filling of the basilar artery proximally is seen. Both anterior inferior cerebellar arteries are seen to opacify.  The right common carotid arteriogram demonstrates mild narrowing of the right external carotid artery just distal to its origin.  More distally the vessel and its branches, otherwise, opacify normally.  The right internal carotid artery at the bulb to the cranial skull base also opacifies normally.  The petrous, the cavernous and the supraclinoid segments are widely patent.  The right middle and the right anterior cerebral arteries opacify normally into the capillary and venous phases.  Opacification via the right posterior communicating artery of the right posterior cerebral artery and the left posterior  cerebral artery is noted. Also demonstrated is retrograde opacification of the distal basilar artery to the level of the superior cerebellar arteries with antegrade unopacified blood also noted suggesting flow from more proximal vessel.  The left common carotid arteriogram demonstrates the left external carotid artery and its major branches to be widely patent.  The left internal carotid artery at the bulb to the cranial skull base opacifies normally.  The petrous, the cavernous and the supraclinoid  segments are widely patent.  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  More distally there is opacification of the left posterior communicating artery with subsequent opacification of the left posterior cerebral artery distribution. Again demonstrated is retrograde opacification of the distal basilar artery.  The left middle and the left anterior cerebral arteries opacify normally into the capillary and venous phases.  Transient opacification via the anterior communicating artery of the right anterior cerebral artery is noted.  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  The left vein of Labbe is seen to drain into the transverse sinus/sigmoid sinus junction with flow distally noted into the internal jugular vein.  The left subclavian arteriogram demonstrates non-visualization of the left vertebral artery and the neck and also at the cranial skull base.  The thyrocervical trunk branches are normal.  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 Antegrade flow is seen in the basilar artery to the level  of the superior cerebellar arteries.  Both posterior communicating arteries open.   Electronically Signed   By: Luanne Bras M.D.   On: 05/20/2014 11:14   Ir Angio Vertebral Sel Vertebral Uni R Mod Sed  05/21/2014   CLINICAL DATA:  Vertebrobasilar ischemia. Occluded left vertebral artery.  EXAM: IR ANGIO VERTEBRAL SEL VERTEBRAL UNI RIGHT MOD SED RIGHT VERTEBRAL ARTERY ANGIOGRAM:  ANESTHESIA/SEDATION: General anesthesia.  MEDICATIONS: As per general anesthesia.  CONTRAST:  27mL OMNIPAQUE IOHEXOL 300 MG/ML  SOLN  PROCEDURE: Following full explanation of the procedure along with the potential associated complications, an informed witnessed consent was obtained.  The patient was put under general anesthesia by the Department of Anesthesiology at [facility].  The right antecubital fossa was prepped and draped in the usual sterile fashion.  Thereafter using a modified Seldinger technique, with ultrasound guidance, access into the right brachial artery was then obtained without difficulty. Over a 0.035 inch guidewire, a 5 French Pinnacle sheath was inserted.  Through this, a 5 [place] JB1 catheter was then advanced and positioned in the right vertebral artery in its proximal 1/3 using biplane roadmap technique and constant fluoroscopic guidance. Arteriograms were then performed centered over the intracranial compartment.  COMPLICATIONS: None immediate.  FINDINGS: The right vertebral artery injection demonstrates the origin of the right vertebral artery to be normal.  The vessel is otherwise seen to opacify normally to the cranial skull base.  Opacification of the right vertebrobasilar junction is seen proximally. The right posterior-inferior cerebellar artery is seen to opacify proximally with a tapered severe stenosis associated with complete occlusion.  Distal to this, the right vertebrobasilar junction demonstrates a right anterior-inferior cerebellar artery which appears to opacify the right  posterior-inferior cerebellar artery distribution.  Severe narrowing of the right vertebrobasilar junction is seen proximal to the confluence with the contralateral vertebral artery to form the basilar artery. The basilar artery itself proximally just distal to the confluence has a severe 95-97% stenosis with antegrade flow more distally to supply the left anterior-inferior cerebellar artery, and the superior cerebellar artery. Transit opacification of the posterior communicating artery is seen on the right.  Also seen is retrograde opacification of the left vertebrobasilar  junction with opacification of the left posterior-inferior cerebellar artery.  There is no clearance of contrast antegradely in the left vertebrobasilar junction consistent with a complete angiographic occlusion.  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  Severe pre-occlusive stenosis of the right vertebrobasilar junction just proximal to the confluence to form the basilar artery.  95-97% stenosis of the proximal basilar artery just distal to the confluence.  Retrograde opacification of the left vertebrobasilar junction and left posterior-inferior cerebellar artery from the right vertebral artery injection.   Electronically Signed   By: Luanne Bras M.D.   On: 05/21/2014 11:30    Labs:  CBC:  Recent Labs  05/20/14 0306 05/20/14 0313 05/21/14 0234  WBC 7.4  --  7.7  HGB 14.3 15.0 13.5  HCT 41.5 44.0 39.0  PLT 169  --  176    COAGS:  Recent Labs  05/20/14 0306 05/21/14 0234  INR 0.99 1.04  APTT 28 45*    BMP:  Recent Labs  09/03/13 1647 03/19/14 1218 05/20/14 0306 05/20/14 0313 05/21/14 0234  NA 139 136 139 141 137  K 4.4 3.7 3.7 3.7 3.6  CL 106 106 108 105 105  CO2 26 23 26   --  24  GLUCOSE 81 93 164* 161* 114*  BUN 15 16 19 21 9   CALCIUM 9.5 8.9 8.6  --  8.2*  CREATININE 0.9 0.8 0.93 0.80 0.82  GFRNONAA  --   --  88*  --  >90  GFRAA  --   --  >90  --   >90    LIVER FUNCTION TESTS:  Recent Labs  03/19/14 1218 05/20/14 0306 05/21/14 0234  BILITOT 1.0 0.8 0.8  AST 27 40* 26  ALT 28 29 23   ALKPHOS 65 62 59  PROT 7.1 6.6 6.1  ALBUMIN 4.4 3.8 3.5    Assessment and Plan:  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

## 2014-05-22 NOTE — Evaluation (Signed)
Occupational Therapy Evaluation Patient Details Name: Alejandro Schneider MRN: [id number] DOB: December 23, 1950 Today's Date: 05/22/2014    History of Present Illness Alejandro Schneider is an 64 y.o. male who was admitted 2/15 with  noted area of numbness along the left side of his nose and onto the top of his lip on the left. Then when the patient got up he was off balance and had difficulty walking. 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   Clinical Impression   Pt admitted with above. He demonstrates the below listed deficits and will benefit from continued OT to maximize safety and independence with BADLs.  Pt presents to OT with impaired balance, impaired midline orientation; visual deficits that include diplopia, impaired divergence, nystagmus with Rt gaze (eyes tested bil and individually), and Lt superior gaze.  Pt with complaint of dizziness with transitions, and mild Lt UE weakness.  Pt was independent PTA, working full time and volunteering.  Currently, he requires mod A for BADLs and functional mobility.  Recommend CIR to allow him to achieve maximal level of independence with BADLs before returning home and to reduce risk of falls/injury.       Follow Up Recommendations  CIR    Equipment Recommendations  None recommended by OT    Recommendations for Other Services       Precautions / Restrictions Precautions Precautions: Fall Precaution Comments: Pt with ataxia, diplopia, nystamgmus and impaired midline orientation       Mobility Bed Mobility Overal bed mobility: Needs Assistance Bed Mobility: Supine to Sit;Sit to Supine     Supine to sit: Min guard Sit to supine: Supervision      Transfers Overall transfer level: Needs assistance Equipment used: Rolling walker (2 wheeled) Transfers: Sit to/from [employer] Sit to Stand: Min assist Stand pivot  transfers: Mod assist       General transfer comment: Pt requires mod A due to impaired balance and ataxia     Balance Overall balance assessment: Needs assistance Sitting-balance support: Feet supported Sitting balance-Leahy Scale: Good Sitting balance - Comments: leans to Lt, but does not lose balance    Standing balance support: Bilateral upper extremity supported Standing balance-Leahy Scale: Poor Standing balance comment: Pt with impaired midline orientation                             ADL Overall ADL's : Needs assistance/impaired Eating/Feeding: Modified independent;Sitting;Bed level   Grooming: Wash/dry hands;Wash/dry face;Oral care;Min guard;Sitting   Upper Body Bathing: Min guard;Sitting   Lower Body Bathing: Moderate assistance;Sit to/from stand   Upper Body Dressing : Minimal assistance;Sitting   Lower Body Dressing: Moderate assistance;Sit to/from stand   Toilet Transfer: Moderate assistance;Ambulation;Comfort height toilet;RW   Toileting- Clothing Manipulation and Hygiene: Moderate assistance;Sit to/from stand       Functional mobility during ADLs: Moderate assistance General ADL Comments: Pt requires mod A due to impaired balance      Vision Vision Assessment?: Yes Eye Alignment: Impaired (comment) Ocular Range of Motion: Within Functional Limits Alignment/Gaze Preference: Head tilt;Head turned Tracking/Visual Pursuits: Decreased smoothness of eye movement to LEFT superior field;Decreased smoothness of horizontal tracking Convergence: Impaired (comment) Visual Fields: No apparent deficits Diplopia Assessment: Disappears with one eye closed;Objects split on top of one another;Objects split side to side;Present in far gaze Additional Comments: Pt with horizontal nystagmus with Rt gaze bil.  eyes (tested together and separately).  Nystagmus noted also with Lt superior gaze.   Pt able to converge to ~ 8" from nose, but unable diverge as OD does  not abduct during divergence    Perception     Praxis      Pertinent Vitals/Pain Pain Assessment: No/denies pain     Hand Dominance Right   Extremity/Trunk Assessment Upper Extremity Assessment Upper Extremity Assessment: LUE deficits/detail LUE Deficits / Details: shoulder and elbow 4/5.  Pt reports h/o shoulder injury  LUE Coordination: decreased gross motor (dysmetria )   Lower Extremity Assessment Lower Extremity Assessment: Defer to PT evaluation   Cervical / Trunk Assessment Cervical / Trunk Assessment: Normal   Communication Communication Communication: Expressive difficulties (low volume )   Cognition Arousal/Alertness: Awake/alert Behavior During Therapy: WFL for tasks assessed/performed;Flat affect Overall Cognitive Status: Within Functional Limits for tasks assessed                     General Comments       Exercises       Shoulder Instructions      Home Living Family/patient expects to be discharged to:: Inpatient rehab                                 Additional Comments: lives with wife      Prior Functioning/Environment Level of Independence: Independent        Comments: works as Copywriter, 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, water     OT Diagnosis: Generalized weakness;Disturbance of vision;Ataxia   OT Problem List: Decreased strength;Decreased activity tolerance;Impaired balance (sitting and/or standing);Impaired vision/perception;Decreased safety awareness;Decreased knowledge of use of DME or AE   OT Treatment/Interventions: Self-care/ADL training;Neuromuscular education;DME and/or AE instruction;Therapeutic activities;Visual/perceptual remediation/compensation;Patient/family education;Balance training    OT Goals(Current goals can be found in the care plan section) Acute Rehab OT Goals Patient Stated Goal: to get better  OT Goal Formulation: With patient/family Time For Goal Achievement: 06/05/14 Potential to  Achieve Goals: Good ADL Goals Pt Will Perform Grooming: with min guard assist;standing Pt Will Perform Lower Body Bathing: with min guard assist;sit to/from stand Pt Will Perform Lower Body Dressing: with min guard assist;sit to/from stand Pt Will Transfer to Toilet: with min guard assist;ambulating;regular height toilet;bedside commode;grab bars Pt Will Perform Toileting - Clothing Manipulation and hygiene: with min guard assist;sit to/from stand Additional ADL Goal #1: Pt will be independent with HEP for vision   OT Frequency: Min 2X/week   Barriers to D/C:            Co-evaluation              End of Session Equipment Utilized During Treatment: Rolling walker Nurse Communication: Mobility status  Activity Tolerance: Patient tolerated treatment well Patient left: in bed;with call bell/phone within reach;with bed alarm set;with family/visitor present   Time: 4098-1191 OT Time Calculation (min): 32 min Charges:  OT General Charges $OT Visit: 1 Procedure OT Evaluation $Initial OT Evaluation Tier I: 1 Procedure OT Treatments $Therapeutic Activity: 8-22 mins G-Codes:    Carzell Saldivar M 06-07-14, 6:22 PM

## 2014-05-22 NOTE — Progress Notes (Signed)
SLP Cancellation Note  Patient Details Name: Alejandro Schneider MRN: [id number] DOB: 26-Jul-1950   Cancelled treatment:       Reason Eval/Treat Not Completed: Patient in process of being evaluated by OT.  Per pt/wife report, only occasional difficulty swallowing; hoarse phonation, c/w Wallenberg Syndrome.  SLP to f/u next date for speech evaluation.  Pt/family agree.    Juan Quam Laurice 05/22/2014, 3:47 PM

## 2014-05-23 ENCOUNTER — Inpatient Hospital Stay (HOSPITAL_COMMUNITY)
Admission: RE | Admit: 2014-05-23 | Discharge: 2014-05-31 | DRG: 057 | Disposition: A | Discharge status: Home / Self Care | Payer: 59 | Source: Intra-hospital | Attending: Physical Medicine & Rehabilitation | Admitting: Physical Medicine & Rehabilitation

## 2014-05-23 ENCOUNTER — Encounter (HOSPITAL_COMMUNITY): Payer: Self-pay | Admitting: Interventional Radiology

## 2014-05-23 DIAGNOSIS — M75100 Unspecified rotator cuff tear or rupture of unspecified shoulder, not specified as traumatic: Secondary | ICD-10-CM | POA: Diagnosis not present

## 2014-05-23 DIAGNOSIS — E785 Hyperlipidemia, unspecified: Secondary | ICD-10-CM | POA: Diagnosis not present

## 2014-05-23 DIAGNOSIS — I10 Essential (primary) hypertension: Secondary | ICD-10-CM

## 2014-05-23 DIAGNOSIS — I651 Occlusion and stenosis of basilar artery: Secondary | ICD-10-CM | POA: Diagnosis not present

## 2014-05-23 DIAGNOSIS — M12819 Other specific arthropathies, not elsewhere classified, unspecified shoulder: Secondary | ICD-10-CM

## 2014-05-23 DIAGNOSIS — I69392 Facial weakness following cerebral infarction: Secondary | ICD-10-CM | POA: Diagnosis not present

## 2014-05-23 DIAGNOSIS — I6501 Occlusion and stenosis of right vertebral artery: Secondary | ICD-10-CM

## 2014-05-23 DIAGNOSIS — I69393 Ataxia following cerebral infarction: Secondary | ICD-10-CM | POA: Diagnosis present

## 2014-05-23 DIAGNOSIS — G464 Cerebellar stroke syndrome: Secondary | ICD-10-CM

## 2014-05-23 DIAGNOSIS — K219 Gastro-esophageal reflux disease without esophagitis: Secondary | ICD-10-CM | POA: Diagnosis not present

## 2014-05-23 DIAGNOSIS — R066 Hiccough: Secondary | ICD-10-CM | POA: Diagnosis not present

## 2014-05-23 DIAGNOSIS — G463 Brain stem stroke syndrome: Secondary | ICD-10-CM | POA: Diagnosis not present

## 2014-05-23 DIAGNOSIS — I63212 Cerebral infarction due to unspecified occlusion or stenosis of left vertebral arteries: Secondary | ICD-10-CM

## 2014-05-23 DIAGNOSIS — M25512 Pain in left shoulder: Secondary | ICD-10-CM

## 2014-05-23 DIAGNOSIS — I6931 Cognitive deficits following cerebral infarction: Secondary | ICD-10-CM | POA: Diagnosis not present

## 2014-05-23 DIAGNOSIS — I639 Cerebral infarction, unspecified: Secondary | ICD-10-CM | POA: Diagnosis present

## 2014-05-23 LAB — BASIC METABOLIC PANEL
ANION GAP: 9 (ref 5–15)
BUN: 8 mg/dL (ref 6–23)
CALCIUM: 8.9 mg/dL (ref 8.4–10.5)
CHLORIDE: 110 mmol/L (ref 96–112)
CO2: 21 mmol/L (ref 19–32)
CREATININE: 0.74 mg/dL (ref 0.50–1.35)
GFR calc Af Amer: >90 mL/min (ref >90)
Glucose, Bld: 104 mg/dL — ABNORMAL HIGH (ref 70–99)
Potassium: 3.9 mmol/L (ref 3.5–5.1)
Sodium: 140 mmol/L (ref 135–145)

## 2014-05-23 LAB — CBC
HEMATOCRIT: 41.4 % (ref 39.0–52.0)
HEMOGLOBIN: 14.1 g/dL (ref 13.0–17.0)
MCH: 30.1 pg (ref 26.0–34.0)
MCHC: 34.1 g/dL (ref 30.0–36.0)
MCV: 88.5 fL (ref 78.0–100.0)
Platelets: 182 10*3/uL (ref 150–400)
RBC: 4.68 MIL/uL (ref 4.22–5.81)
RDW: 13.1 % (ref 11.5–15.5)
WBC: 7.5 10*3/uL (ref 4.0–10.5)

## 2014-05-23 MED ORDER — CLOPIDOGREL BISULFATE 75 MG PO TABS
75.0000 mg | ORAL_TABLET | Freq: Every day | ORAL | Status: DC
  Administered 2014-05-24 – 2014-05-31 (×8): 75 mg via ORAL
  Filled 2014-05-23 (×9): qty 1

## 2014-05-23 MED ORDER — ATORVASTATIN CALCIUM 20 MG PO TABS
20.0000 mg | ORAL_TABLET | Freq: Every day | ORAL | Status: DC
  Administered 2014-05-23 – 2014-05-30 (×8): 20 mg via ORAL
  Filled 2014-05-23 (×9): qty 1

## 2014-05-23 MED ORDER — SENNOSIDES-DOCUSATE SODIUM 8.6-50 MG PO TABS: 1.0000 | ORAL_TABLET | Freq: Every evening | ORAL | Status: DC | PRN

## 2014-05-23 MED ORDER — FAMOTIDINE 20 MG PO TABS
20.0000 mg | ORAL_TABLET | Freq: Two times a day (BID) | ORAL | Status: DC
  Administered 2014-05-23 – 2014-05-31 (×16): 20 mg via ORAL
  Filled 2014-05-23 (×18): qty 1

## 2014-05-23 MED ORDER — ACETAMINOPHEN 325 MG PO TABS
325.0000 mg | ORAL_TABLET | ORAL | Status: DC | PRN
  Administered 2014-05-26: 325 mg via ORAL
  Administered 2014-05-28: 650 mg via ORAL
  Filled 2014-05-23: qty 2
  Filled 2014-05-23: qty 1

## 2014-05-23 MED ORDER — ASPIRIN EC 81 MG PO TBEC
81.0000 mg | DELAYED_RELEASE_TABLET | Freq: Every day | ORAL | Status: DC
  Administered 2014-05-24 – 2014-05-31 (×8): 81 mg via ORAL
  Filled 2014-05-23 (×9): qty 1

## 2014-05-23 MED ORDER — ONDANSETRON HCL 4 MG PO TABS: 4.0000 mg | ORAL_TABLET | Freq: Four times a day (QID) | ORAL | Status: DC | PRN

## 2014-05-23 MED ORDER — ONDANSETRON HCL 4 MG/2ML IJ SOLN: 4.0000 mg | Freq: Four times a day (QID) | INTRAMUSCULAR | Status: DC | PRN

## 2014-05-23 MED ORDER — BACLOFEN 20 MG PO TABS
20.0000 mg | ORAL_TABLET | Freq: Three times a day (TID) | ORAL | Status: DC
  Administered 2014-05-23 – 2014-05-24 (×2): 20 mg via ORAL
  Filled 2014-05-23 (×6): qty 1

## 2014-05-23 MED ORDER — ENOXAPARIN SODIUM 40 MG/0.4ML ~~LOC~~ SOLN: 40.0000 mg | SUBCUTANEOUS | Status: DC

## 2014-05-23 MED ORDER — METOCLOPRAMIDE HCL 5 MG PO TABS
5.0000 mg | ORAL_TABLET | Freq: Three times a day (TID) | ORAL | Status: DC
  Administered 2014-05-23 (×2): 5 mg via ORAL
  Filled 2014-05-23 (×2): qty 1

## 2014-05-23 MED ORDER — ENOXAPARIN SODIUM 40 MG/0.4ML ~~LOC~~ SOLN
40.0000 mg | SUBCUTANEOUS | Status: DC
  Administered 2014-05-23 – 2014-05-29 (×7): 40 mg via SUBCUTANEOUS
  Filled 2014-05-23 (×12): qty 0.4

## 2014-05-23 MED ORDER — SORBITOL 70 % SOLN
30.0000 mL | Freq: Every day | Status: DC | PRN
  Administered 2014-05-24 – 2014-05-28 (×2): 30 mL via ORAL
  Filled 2014-05-23 (×2): qty 30

## 2014-05-23 NOTE — Discharge Summary (Addendum)
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  Past Medical History  Diagnosis Date  . Hyperlipidemia    Past Surgical History  Procedure Laterality Date  . Tonsillectomy  1972  . Inguinal hernia repair  10/16/02    Right  Dr Hassell Done  . Colonoscopy  2006  . Lumbar laminectomy/decompression microdiscectomy Right 05/30/2012    Procedure: LUMBAR LAMINECTOMY/DECOMPRESSION MICRODISCECTOMY 1 LEVEL;  Surgeon: Faythe Ghee, MD;  Location: [facility];  Service: Neurosurgery;  Laterality: Right;  . Radiology with anesthesia N/A 05/21/2014    Procedure: ANGIOPLASTY POSSIBLE STENT;  Surgeon: Rob Hickman, MD;  Location: [place];  Service: Radiology;  Laterality: N/A;    Medications to be continued on Rehab .  stroke: mapping our early stages of recovery book   Does not apply Once  . aspirin EC  81 mg Oral Daily  . atorvastatin  20 mg Oral q1800  . baclofen  20 mg Oral TID  . clopidogrel  75 mg Oral Daily  . enoxaparin (LOVENOX) injection  40 mg Subcutaneous Q24H  . famotidine  20 mg Oral BID  . metoCLOPramide  5 mg Oral TID AC & HS    LABORATORY STUDIES CBC    Component Value Date/Time   WBC 7.5 05/23/2014 0806   RBC 4.68 05/23/2014 0806   HGB 14.1 05/23/2014 0806   HCT 41.4 05/23/2014 0806   PLT 182 05/23/2014 0806   MCV  88.5 05/23/2014 0806   MCH 30.1 05/23/2014 0806   MCHC 34.1 05/23/2014 0806   RDW 13.1 05/23/2014 0806   LYMPHSABS 1.2 05/21/2014 0234   MONOABS 0.8 05/21/2014 0234   EOSABS 0.2 05/21/2014 0234   BASOSABS 0.0 05/21/2014 0234   CMP    Component Value Date/Time   NA 140 05/23/2014 0806   K 3.9 05/23/2014 0806   CL 110 05/23/2014 0806   CO2 21 05/23/2014 0806   GLUCOSE 104* 05/23/2014 0806   BUN 8 05/23/2014 0806   CREATININE 0.74 05/23/2014 0806   CALCIUM 8.9 05/23/2014 0806   PROT 6.1 05/21/2014 0234   ALBUMIN 3.5 05/21/2014 0234   AST 26 05/21/2014 0234   ALT 23 05/21/2014 0234   ALKPHOS 59 05/21/2014 0234   BILITOT 0.8 05/21/2014 0234   GFRNONAA >90 05/23/2014 0806   GFRAA >90 05/23/2014 0806   COAGS Lab Results  Component Value Date   INR 1.04 05/21/2014   INR 0.99 05/20/2014   Lipid Panel    Component Value Date/Time   CHOL 189 05/21/2014 0234   TRIG 148 05/21/2014 0234   HDL 38* 05/21/2014 0234   CHOLHDL 5.0 05/21/2014 0234   VLDL 30 05/21/2014 0234   LDLCALC 121* 05/21/2014 0234   HgbA1C  Lab Results  Component Value Date   HGBA1C 5.6 05/21/2014   Cardiac Panel (last  3 results) No results for input(s): CKTOTAL, CKMB, TROPONINI, RELINDX in the last 72 hours. Urinalysis No results found for: COLORURINE, APPEARANCEUR, LABSPEC, PHURINE, GLUCOSEU, HGBUR, BILIRUBINUR, KETONESUR, PROTEINUR, UROBILINOGEN, NITRITE, LEUKOCYTESUR Urine Drug Screen No results found for: LABOPIA, COCAINSCRNUR, LABBENZ, AMPHETMU, THCU, LABBARB  Alcohol Level No results found for: ETH   SIGNIFICANT DIAGNOSTIC STUDIES   Ct Angio Head and neck W/cm &/or Wo Cm  05/20/2014 IMPRESSION: Occluded LEFT vertebral artery, with minimal reconstitution within the foraminal segments. Occlusion of the distal LEFT V3 segment through the intracranial level. 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 However, there is very regular distal RIGHT V4 segment and, apparent included RIGHT posterior inferior cerebellar artery. High-grade stenosis versus occluded proximal basilar artery, small amount of contrast in the mid to distal basilar artery may represent retrograde flow, complete circle of Willis noted. Moderately irregular intracranial vessels suggests atherosclerosis, less likely vasculopathy. Patent posterior cerebral arteries though, less robust flow related enhancement of the LEFT mid to distal PCA segments.   Ct Head (brain) Wo Contrast  05/20/2014 IMPRESSION: Equivocal hyperdense right MCA. No intracranial hemorrhage. Otherwise normal brain.   Mri and Mra Head Wo Contrast  05/20/2014 IMPRESSION: MRI HEAD: No acute intracranial process ; normal noncontrast MRI of the brain for age. 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 MRA HEAD: 3 mm laterally directed outpouching of LEFT cavernous carotid artery segments, considering proximity to the supraclinoid artery, this is less likely a para ophthalmic aneurysm. LEFT vertebral artery occlusion. In addition, thready irregular distal RIGHT vertebral artery and high-grade stenosis of the proximal basilar artery. 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 Constellation of findings would be better characterized on CT angiogram of the head and neck.   [name] W/o Cm  05/20/2014 IMPRESSION: Acute infarct left posterior lateral medulla.   Ir Angiogram  05/20/2014 IMPRESSION: Occluded left vertebral artery in its entirety. 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 Antegrade flow is seen in the basilar artery to the level of the superior cerebellar arteries. Both posterior communicating arteries open.   05/21/14 - 1.Severe  preocclusive stenosis of pro basilar artery, and RT ABJ. 2.Retrograde opacification of LT VBJ from hypoplastic RT VA  TCD - right vert and BA antegrade flow  2D Echocardiogram - Left ventricle: The cavity size was normal. Wall thickness was normal. Systolic function was normal. The estimated ejection fraction was in the range of 60% to 65%. Doppler parameters are consistent with abnormal left ventricular relaxation (grade 1 diastolic dysfunction).    HISTORY OF PRESENT ILLNES Alejandro Schneider is a 64 y.o. male who reported that he went to bed normal the night prior to admission. At about 0130 in the morning he awakened and noted an area of numbness along the left side of his nose and onto the top of his lip on the left. When the patient got up he was off balance and had difficulty walking. He reported dizziness that he described as a "wooziness" which he experienced on siting and standing. Initial NIHSS of 1. A CT of the head in the emergency department showed no acute changes; however, due to the patient's symptoms further workup was felt to be indicated.   Date last known well: Date: 05/19/2014 Time last known well: Time: 23:45 tPA Given: No: Minimal  symptoms  HOSPITAL COURSE A CT angiogram of the head was performed. This revealed an occluded LEFT vertebral artery, with minimal reconstitution within the foraminal segments. Occlusion of the distal LEFT V3 segment through the intracranial level. 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 However, there is very regular distal RIGHT V4 segment and, apparent included RIGHT posterior inferior cerebellar artery. High-grade stenosis versus occluded proximal basilar artery, small amount of contrast in the mid to distal basilar artery may represent retrograde flow, complete circle of  Willis noted. Moderately irregular intracranial vessels suggests atherosclerosis, less likely vasculopathy. Patent posterior cerebral arteries though, less robust flow related enhancement of the LEFT mid to distal PCA segments.   A decision was made to proceed with a cerebral angiogram. This was performed by Dr. Estanislado Pandy and demonstrated an occluded left vertebral artery in its entirety as well as pre-occlusive stenosis of the right vertebrobasilar junction and possibly the proximal basilar artery. Antegrade flow was seen in the basilar artery to the level of the superior cerebellar arteries. Both posterior communicating arteries were open.Severe preocclusive stenosis of pro basilar artery, and RT ABJ. Retrograde opacification of LT VBJ from hypoplastic RT VA  The patient was taken back to interventional radiology again on 05/21/2014 for possible PTA stenting however no intervention was performed at that time. It was felt that another attempt might be made in a few weeks.  The patient was evaluated by the physical, occupational, and speech  Therapists. Inpatient rehabilitation was recommended. He was then evaluated by the rehabilitation physician and felt to be a good candidate for further inpatient therapies.  The patient developed intractable hiccups which were treated with baclofen, Thorazine, and later Reglan. He was placed on dual antiplatelet therapy for his cerebrovascular disease. This was to be continued for 3 months at which time he would remain on Plavix without aspirin. He was placed on Lipitor for an elevated LDL. It was felt that his systolic blood pressure should remain in the 130 to 150 range for at least 2-3 weeks to avoid posterior circulation hypoperfusion.  On 05/23/2014 he was felt to be stable and appropriate for admission to the rehabilitation center.  DISCHARGE EXAM Blood pressure 128/73, pulse 64, temperature 98.2 F (36.8 C), temperature source Oral, resp. rate 18,  height 5\' 10"  (1.778 m), weight 94.802 kg (209 lb), SpO2 100 %.  General - Well nourished, well developed, in no apparent distress.  Ophthalmologic - fundi not visualized due to photophobia.  Cardiovascular - Regular rate and rhythm with no murmur.  Neck - supple, no carotid bruits  Mental Status -  Level of arousal and orientation to time, place, and person were intact. Language including expression, naming, repetition, comprehension was assessed and found intact.  Cranial Nerves II - XII - II - Visual field intact OU. III, IV, VI - Extraocular movements intact. V - Facial sensation decreased on the left V2 V3. VII - Facial movement intact bilaterally. VIII - Hearing & vestibular intact bilaterally, no nystagmus. X - Palate elevates symmetrically. XI - Chin turning & shoulder shrug intact bilaterally. XII - Tongue protrusion intact.  Motor Strength - The patient's strength was normal in all extremities and pronator drift was absent. Bulk was normal and fasciculations were absent.  Motor Tone - Muscle tone was assessed at the neck and appendages and was normal.  Reflexes - The patient's reflexes were symmetrical in all  extremities and he had no pathological reflexes.  Sensory - Light touch, temperature/pinprick were assessed and were symmetrical.   Coordination - The patient had mild ataxia or dysmetria on left hand FTN. Tremor was absent.  Gait and Station - not tested due to dizziness. Discharge Diet  Diet Heart with thin liquids  DISCHARGE PLAN  Disposition:  Transfer to [facility] for ongoing PT, OT and ST  aspirin 81 mg orally every day and clopidogrel 75 mg orally every day for secondary stroke prevention.  Recommend ongoing risk factor control by Primary Care Physician at time of discharge from inpatient rehabilitation.  Follow-up Elsie Stain, MD in 2 weeks following discharge from rehab.  Follow-up with Dr. Rosalin Hawking, Stroke Clinic in 2  months.  35 minutes were spent preparing discharge.  Mikey Bussing PA-C [facility] Pager [phone] 05/23/2014, 2:21 PM  I, the attending vascular neurologist, have personally obtained a history, examined the patient, evaluated laboratory data, individually viewed imaging studies and agree with radiology interpretations. Together with the NP/PA, we formulated the assessment and plan of care which reflects our mutual decision.  I have made any additions or clarifications directly to the above note and agree with the findings and plan as currently documented.   Rosalin Hawking, MD PhD Stroke Neurology 05/24/2014 4:35 PM

## 2014-05-23 NOTE — Progress Notes (Signed)
Rehab admissions - I have approval for acute inpatient rehab admission from insurance carrier.  Bed available on rehab today and will admit to acute inpatient rehab later today.  Call me for questions.  #[phone]

## 2014-05-23 NOTE — Progress Notes (Signed)
[name], MD Physician Signed Physical Medicine and Rehabilitation Consult Note 05/22/2014 11:18 AM  Related encounter: ED to Hosp-Admission (Current) from 05/20/2014 in [place] Collapse All        Physical Medicine and Rehabilitation Consult Reason for Consult: Left lateral medullary infarct Referring Physician: Dr.Xu   HPI: Alejandro Schneider is a 64 y.o.right handed male with history of hyperlipidemia. Patient independent prior to admission living with his wife working for [employer] Admitted 05/20/2014 with dizziness, facial weakness and headache. 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 MRA of the head with occluded left vertebral artery as well as preocclusive stenosis of the right vertebrobasilar junction and possibly the proximal basilar artery. Echocardiogram with ejection fraction 18% grade 1 diastolic dysfunction. 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 Attempted stenting by interventional radiology 05/21/2014 was unsuccessful. Neurology follow-up currently maintained on aspirin and Plavix for CVA prophylaxis. Subcutaneous Lovenox for DVT prophylaxis. Patient is tolerating a regular diet. Physical therapy evaluation completed 05/22/2014 with recommendations of physical medicine rehabilitation consult.  Patient sitting at bedside, consistent, wife and friend of family are visiting.  Review of Systems  Eyes: Positive for blurred vision.  Neurological: Positive for dizziness, weakness and headaches.  All other systems reviewed and are negative.  Past Medical History  Diagnosis Date  . Hyperlipidemia    Past Surgical History  Procedure Laterality Date  . Tonsillectomy  1972  . Inguinal hernia repair  10/16/02    Right Dr Hassell Done  . Colonoscopy  2006  . Lumbar laminectomy/decompression  microdiscectomy Right 05/30/2012    Procedure: LUMBAR LAMINECTOMY/DECOMPRESSION MICRODISCECTOMY 1 LEVEL; Surgeon: Faythe Ghee, MD; Location: [facility]; Service: Neurosurgery; Laterality: Right;   Family History  Problem Relation Age of Onset  . Heart disease Father     CABGx4  . Cancer Father     Prostate (Radiation)  . Prostate cancer Father   . Heart disease Paternal Grandfather     MI  . Hypertension Neg Hx   . Drug abuse Neg Hx   . Alcohol abuse Neg Hx   . Stroke Neg Hx   . Colon cancer Neg Hx   . Diabetes Cousin   . Depression Mother    Social History:  reports that he has never smoked. He has never used smokeless tobacco. He reports that he drinks alcohol. He reports that he does not use illicit drugs. Allergies:  Allergies  Allergen Reactions  . Bee Venom     S/p allergy shots (and no reaction with stings after shots)   Medications Prior to Admission  Medication Sig Dispense Refill  . Multiple Vitamins-Minerals (MULTIVITAMIN,TX-MINERALS) tablet Take 1 tablet by mouth daily.     . clotrimazole-betamethasone (LOTRISONE) cream Apply 1 application topically 2 (two) times daily. (Patient not taking: Reported on 05/20/2014) 30 g 1    Home[place] expects to be discharged to:: Inpatient rehab Additional Comments: lives with wife  Functional History: Prior Function Level of Independence: Independent Comments: works as Clinical cytogeneticist for [employer] Status:  Mobility: Bed Mobility Overal bed mobility: Needs Assistance Bed Mobility: Supine to Sit Supine to sit: Min guard General bed mobility comments: increased time but able to log roll and push self up into sitting Transfers Overall transfer level: Needs assistance Equipment used: None, Rolling walker (2 wheeled) Transfers: Sit to/from Stand Sit to Stand: [employer]  assist, Mod assist General transfer  comment: pt modA when transitioning into standing without RW due to instability and strong lean to L. minA with RW due to increased support from RW. Ambulation/Gait Ambulation/Gait assistance: +2 safety/equipment, Mod assist Ambulation Distance (Feet): 10 Feet (x2) Assistive device: Rolling walker (2 wheeled), None Gait Pattern/deviations: Step-to pattern, Decreased stride length, Drifts right/left Gait velocity: slow General Gait Details: attempted to ambulate without RW however pt with strong lean to left requiring modA to maintain upright posture. pt with scissored gait pattern and c/o dizziness of 5/10. pt with slightly improved with RW however con't to lean Left and even tips RW. Pt aware but unable to self correct    ADL:    Cognition: Cognition Overall Cognitive Status: Within Functional Limits for tasks assessed Orientation Level: Oriented X4 Cognition Arousal/Alertness: Awake/alert Behavior During Therapy: WFL for tasks assessed/performed Overall Cognitive Status: Within Functional Limits for tasks assessed  Blood pressure 159/93, pulse 61, temperature 98.7 F (37.1 C), temperature source Oral, resp. rate 18, height 5\' 10"  (1.778 m), weight 94.802 kg (209 lb), SpO2 98 %. Physical Exam  Constitutional: He is oriented to person, place, and time. He appears well-developed.  HENT:  Head: Normocephalic.  Eyes: EOM are normal.  Without nystagamus  Neck: Normal range of motion. Neck supple. No thyromegaly present.  Cardiovascular: Normal rate and regular rhythm.  Respiratory: Effort normal and breath sounds normal. No respiratory distress.  GI: Soft. Bowel sounds are normal. He exhibits no distension.  Neurological: He is alert and oriented to person, place, and time.  Follows commands.Decreased fine motor skills and somewhat ataxic   No evidence of ataxia right finger-nose-finger, moderate ataxia left finger-nose-finger Horizontal nystagmus to the right, no evidence of  extraocular muscle weakness Neuro: Eyes without evidence of nystagmus  Tone is normal without evidence of spasticity Cerebellar exam shows no evidence of ataxia on finger nose finger or heel to shin testing evidence of trunkal ataxia on Left  Motor strength is 5/5 in bilateral deltoid, biceps, triceps, finger flexors and extensors, wrist flexors and extensors, hip flexors, knee flexors and extensors, ankle dorsiflexors, plantar flexors, invertors and evertors, toe flexors and extensors  Sensory exam is normal to , proprioception and light touch in the upper and lower limbs   Cranial nerves II- Visual fields are intact to confrontation testing, no blurring of vision III- no evidence of ptosis, upward, downward and medial gaze intact IV- no vertical diplopia or head tilt V- no facial numbness or masseter weakness VI- no pupil abduction weakness VII- no facial droop, good lid closure VII- normal auditory acuity IX- decreased vocal volume X- + hoarseness XI- no trap or SCM weakness XII- no glossal weakness     Lab Results Last 24 Hours    No results found for this or any previous visit (from the past 24 hour(s)).    Imaging Results (Last 48 hours)    Ir US Guide Vasc Access Right  05/21/2014 CLINICAL DATA: Vertebrobasilar ischemia. Occluded left vertebral artery. EXAM: IR ANGIO VERTEBRAL SEL VERTEBRAL UNI RIGHT MOD SED RIGHT VERTEBRAL ARTERY ANGIOGRAM: ANESTHESIA/SEDATION: General anesthesia. MEDICATIONS: As per general anesthesia. CONTRAST: 55mL OMNIPAQUE IOHEXOL 300 MG/ML SOLN PROCEDURE: Following full explanation of the procedure along with the potential associated complications, an informed witnessed consent was obtained. The patient was put under general anesthesia by the Department of Anesthesiology at [facility]. The right antecubital fossa was prepped and draped in the usual sterile fashion. Thereafter using a modified Seldinger technique, with  ultrasound  guidance, access into the right brachial artery was then obtained without difficulty. Over a 0.035 inch guidewire, a 5 French Pinnacle sheath was inserted. Through this, a 5 [place] JB1 catheter was then advanced and positioned in the right vertebral artery in its proximal 1/3 using biplane roadmap technique and constant fluoroscopic guidance. Arteriograms were then performed centered over the intracranial compartment. COMPLICATIONS: None immediate. FINDINGS: The right vertebral artery injection demonstrates the origin of the right vertebral artery to be normal. The vessel is otherwise seen to opacify normally to the cranial skull base. Opacification of the right vertebrobasilar junction is seen proximally. The right posterior-inferior cerebellar artery is seen to opacify proximally with a tapered severe stenosis associated with complete occlusion. Distal to this, the right vertebrobasilar junction demonstrates a right anterior-inferior cerebellar artery which appears to opacify the right posterior-inferior cerebellar artery distribution. Severe narrowing of the right vertebrobasilar junction is seen proximal to the confluence with the contralateral vertebral artery to form the basilar artery. The basilar artery itself proximally just distal to the confluence has a severe 95-97% stenosis with antegrade flow more distally to supply the left anterior-inferior cerebellar artery, and the superior cerebellar artery. Transit opacification of the posterior communicating artery is seen on the right. Also seen is retrograde opacification of the left vertebrobasilar junction with opacification of the left posterior-inferior cerebellar artery. There is no clearance of contrast antegradely in the left vertebrobasilar junction consistent with a complete angiographic occlusion. 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 Severe pre-occlusive stenosis of the right vertebrobasilar  junction just proximal to the confluence to form the basilar artery. 95-97% stenosis of the proximal basilar artery just distal to the confluence. Retrograde opacification of the left vertebrobasilar junction and left posterior-inferior cerebellar artery from the right vertebral artery injection. Electronically Signed By: Luanne Bras M.D. On: 05/21/2014 11:30   Mr [place] W/o Cm  05/20/2014 ADDENDUM REPORT: 05/20/2014 18:27 ADDENDUM: Critical Value/emergent results were called by telephone at the time of interpretation on 05/20/2014 at 6:27 pm to Dr. Wallie Char , who verbally acknowledged these results.  Electronically Signed By: Franchot Gallo M.D. On: 05/20/2014 18:27   05/20/2014 CLINICAL DATA: Stroke. Vertigo. EXAM: MRI HEAD WITHOUT CONTRAST TECHNIQUE: Multiplanar, multiecho pulse sequences of the brain and surrounding structures were obtained without intravenous contrast. COMPARISON: CTA 05/20/2014 FINDINGS: Limited study with diffusion weighted only performed in the axial and coronal plane Acute infarct in the left posterior lateral medulla measuring approximately 6 mm. No other area of acute infarct identified. This area of restricted diffusion has become considerably more apparent and larger compared with the MRI earlier today. IMPRESSION: Acute infarct left posterior lateral medulla. Electronically Signed: By: Franchot Gallo M.D. On: 05/20/2014 18:22   Ir Angio Vertebral Sel Vertebral Uni R Mod Sed  05/21/2014 CLINICAL DATA: Vertebrobasilar ischemia. Occluded left vertebral artery. EXAM: IR ANGIO VERTEBRAL SEL VERTEBRAL UNI RIGHT MOD SED RIGHT VERTEBRAL ARTERY ANGIOGRAM: ANESTHESIA/SEDATION: General anesthesia. MEDICATIONS: As per general anesthesia. CONTRAST: 79mL OMNIPAQUE IOHEXOL 300 MG/ML SOLN PROCEDURE: Following full explanation of the procedure along with the potential associated complications, an informed witnessed consent was obtained. The  patient was put under general anesthesia by the Department of Anesthesiology at [facility] The right antecubital fossa was prepped and draped in the usual sterile fashion. Thereafter using a modified Seldinger technique, with ultrasound guidance, access into the right brachial artery was then obtained without difficulty. Over a 0.035 inch guidewire, a 5 French Pinnacle sheath was inserted. Through  this, a 5 [place] JB1 catheter was then advanced and positioned in the right vertebral artery in its proximal 1/3 using biplane roadmap technique and constant fluoroscopic guidance. Arteriograms were then performed centered over the intracranial compartment. COMPLICATIONS: None immediate. FINDINGS: The right vertebral artery injection demonstrates the origin of the right vertebral artery to be normal. The vessel is otherwise seen to opacify normally to the cranial skull base. Opacification of the right vertebrobasilar junction is seen proximally. The right posterior-inferior cerebellar artery is seen to opacify proximally with a tapered severe stenosis associated with complete occlusion. Distal to this, the right vertebrobasilar junction demonstrates a right anterior-inferior cerebellar artery which appears to opacify the right posterior-inferior cerebellar artery distribution. Severe narrowing of the right vertebrobasilar junction is seen proximal to the confluence with the contralateral vertebral artery to form the basilar artery. The basilar artery itself proximally just distal to the confluence has a severe 95-97% stenosis with antegrade flow more distally to supply the left anterior-inferior cerebellar artery, and the superior cerebellar artery. Transit opacification of the posterior communicating artery is seen on the right. Also seen is retrograde opacification of the left vertebrobasilar junction with opacification of the left posterior-inferior cerebellar artery. There is no clearance of  contrast antegradely in the left vertebrobasilar junction consistent with a complete angiographic occlusion. 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 Severe pre-occlusive stenosis of the right vertebrobasilar junction just proximal to the confluence to form the basilar artery. 95-97% stenosis of the proximal basilar artery just distal to the confluence. Retrograde opacification of the left vertebrobasilar junction and left posterior-inferior cerebellar artery from the right vertebral artery injection. Electronically Signed By: Luanne Bras M.D. On: 05/21/2014 11:30     Assessment/Plan: Diagnosis:Left posterior medullary infarct 1. Does the need for close, 24 hr/day medical supervision in concert with the patient's rehab needs make it unreasonable for this patient to be served in a less intensive setting? Yes 2. Co-Morbidities requiring supervision/potential complications: basilar artery stenosis 3. Due to bladder management, bowel management, safety, skin/wound care, disease management, medication administration, pain management and patient education, does the patient require 24 hr/day rehab nursing? Yes 4. Does the patient require coordinated care of a physician, rehab nurse, PT (1-2 hrs/day, 5 days/week), OT (1-2 hrs/day, 5 days/week) and SLP (.5-1 hrs/day, 5 days/week) to address physical and functional deficits in the context of the above medical diagnosis(es)? Yes Addressing deficits in the following areas: balance, endurance, locomotion, strength, transferring, bowel/bladder control, bathing, dressing, feeding, grooming, toileting, cognition, speech and language 5. Can the patient actively participate in an intensive therapy program of at least 3 hrs of therapy per day at least 5 days per week? Yes 6. The potential for patient to make measurable gains while on inpatient rehab is excellent 7. Anticipated functional outcomes upon discharge  from inpatient rehab are supervision with PT, supervision with OT, modified independent with SLP. 8. Estimated rehab length of stay to reach the above functional goals is: 10-14d 9. Does the patient have adequate social supports and living environment to accommodate these discharge functional goals? Yes 10. Anticipated D/C setting: Home 11. Anticipated post D/C treatments: [place] therapy 12. Overall Rehab/Functional Prognosis: good  RECOMMENDATIONS: This patient's condition is appropriate for continued rehabilitative care in the following setting: CIR Patient has agreed to participate in recommended program. Yes Note that insurance prior authorization may be required for reimbursement for recommended care.  Comment: Discussed case with Dr. Erlinda Hong no plans for additional endovascular procedures  05/22/2014       Revision History     Date/Time User Provider Type Action   05/22/2014 12:23 PM [name], MD Physician Sign   05/22/2014 11:43 AM Cathlyn Parsons, PA-C Physician Assistant [place]   View Details Report       Routing History     Date/Time From To Method   05/22/2014 12:23 PM [name], MD [name], MD In Basket   05/22/2014 12:23 PM [name], MD Tonia Ghent, MD In [facility]

## 2014-05-23 NOTE — Progress Notes (Signed)
STROKE TEAM PROGRESS NOTE   HISTORY OF PRESENT ILLNESS Alejandro Schneider is an 64 y.o. male who reports that he went to bed normal last night. At about 0130 this morning awakened and noted a area of numbness along the left side of his nose and onto the top of his lip on the left. Then when the patient got up he was off balance and had difficulty walking. He reports dizziness that he describes as a "wooziness" that he experiences on siting and standing. Initial NIHSS of 1.   SUBJECTIVE (INTERVAL HISTORY) His wife is at the bedside. He continues to have hiccups. Add Reglan per Dr.Keatyn Jawad. Continue baclofen. Patient was not able to tolerate Thorazine. Stated that the he is dizziness getting better, no nausea vomiting, eating well, still off balance on walking. Waiting for CIR placement.   OBJECTIVE Temp:  [97.3 F (36.3 C)-98.7 F (37.1 C)] 98.2 F (36.8 C) (02/18 0514) Pulse Rate:  [61-82] 64 (02/18 1008) Cardiac Rhythm:  [-] Normal sinus rhythm (02/17 2100) Resp:  [18] 18 (02/18 1008) BP: (128-159)/(71-96) 128/73 mmHg (02/18 1008) SpO2:  [95 %-100 %] 100 % (02/18 1008)   Recent Labs Lab 05/20/14 0336  GLUCAP 150*    Recent Labs Lab 05/20/14 0306 05/20/14 0313 05/21/14 0234 05/23/14 0806  NA 139 141 137 140  K 3.7 3.7 3.6 3.9  CL 108 105 105 110  CO2 26  --  24 21  GLUCOSE 164* 161* 114* 104*  BUN 19 21 9 8   CREATININE 0.93 0.80 0.82 0.74  CALCIUM 8.6  --  8.2* 8.9    Recent Labs Lab 05/20/14 0306 05/21/14 0234  AST 40* 26  ALT 29 23  ALKPHOS 62 59  BILITOT 0.8 0.8  PROT 6.6 6.1  ALBUMIN 3.8 3.5    Recent Labs Lab 05/20/14 0306 05/20/14 0313 05/21/14 0234 05/23/14 0806  WBC 7.4  --  7.7 7.5  NEUTROABS 5.7  --  5.6  --   HGB 14.3 15.0 13.5 14.1  HCT 41.5 44.0 39.0 41.4  MCV 89.8  --  88.6 88.5  PLT 169  --  176 182   No results for input(s): CKTOTAL, CKMB, CKMBINDEX, TROPONINI in the last 168 hours.  Recent Labs  05/21/14 0234  LABPROT 13.7  INR  1.04   No results for input(s): COLORURINE, LABSPEC, PHURINE, GLUCOSEU, HGBUR, BILIRUBINUR, KETONESUR, PROTEINUR, UROBILINOGEN, NITRITE, LEUKOCYTESUR in the last 72 hours.  Invalid input(s): APPERANCEUR     Component Value Date/Time   CHOL 189 05/21/2014 0234   TRIG 148 05/21/2014 0234   HDL 38* 05/21/2014 0234   CHOLHDL 5.0 05/21/2014 0234   VLDL 30 05/21/2014 0234   LDLCALC 121* 05/21/2014 0234   Lab Results  Component Value Date   HGBA1C 5.6 05/21/2014   No results found for: LABOPIA, COCAINSCRNUR, LABBENZ, AMPHETMU, THCU, LABBARB  No results for input(s): ETH in the last 168 hours.  I have personally reviewed the radiological images below and agree with the radiology interpretations.  Ct Angio Head and neck W/cm &/or Wo Cm  05/20/2014  IMPRESSION: Occluded LEFT vertebral artery, with minimal reconstitution within the foraminal segments. Occlusion of the distal LEFT V3 segment through the intracranial level.  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 However, there is very regular distal RIGHT V4 segment and, apparent included RIGHT posterior inferior cerebellar artery.  High-grade stenosis versus occluded  proximal basilar artery, small amount of contrast in the mid to distal basilar artery may represent retrograde flow, complete circle of Willis noted.  Moderately irregular intracranial vessels suggests atherosclerosis, less likely vasculopathy. Patent posterior cerebral arteries though, less robust flow related enhancement of the LEFT mid to distal PCA segments.      Ct Head (brain) Wo Contrast  05/20/2014   IMPRESSION: Equivocal hyperdense right MCA. No intracranial hemorrhage. Otherwise normal brain.    Mri and Mra Head Wo Contrast  05/20/2014   IMPRESSION: MRI HEAD: No acute intracranial process ; normal noncontrast MRI of  the brain for age.  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  MRA HEAD: 3 mm laterally directed outpouching of LEFT cavernous carotid artery segments, considering proximity to the supraclinoid artery, this is less likely a para ophthalmic aneurysm.  LEFT vertebral artery occlusion. In addition, thready irregular distal RIGHT vertebral artery and high-grade stenosis of the proximal basilar artery. 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  Constellation of findings would be better characterized on CT angiogram of the head and neck.    [name] W/o Cm  05/20/2014   IMPRESSION: Acute infarct left posterior lateral medulla.    Ir Angiogram  05/20/2014   IMPRESSION: Occluded left vertebral artery in its entirety.  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 Antegrade flow is seen in the basilar artery to the level of the superior cerebellar arteries.  Both posterior communicating arteries open.    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 2.Retrograde opacification of LT VBJ from hypoplastic RT VA  TCD - right vert and BA antegrade flow  2D Echocardiogram  - Left ventricle: The cavity size was normal. Wall thickness was normal. Systolic function was normal. The estimated ejection fraction was in the range of 60% to 65%. Doppler parameters are consistent with abnormal left ventricular relaxation (grade 1 diastolic dysfunction).   PHYSICAL EXAM  Temp:  [97.3 F (36.3 C)-98.7 F (37.1 C)] 98.2 F (36.8 C) (02/18 0514) Pulse Rate:  [61-82] 64 (02/18 1008) Resp:  [18] 18 (02/18 1008) BP: (128-159)/(71-96) 128/73 mmHg (02/18 1008) SpO2:  [95 %-100 %] 100 % (02/18 1008)  General - Well nourished, well developed, in no apparent distress.  Ophthalmologic - fundi not visualized due to  photophobia.  Cardiovascular - Regular rate and rhythm with no murmur.  Neck - supple, no carotid bruits  Mental Status -  Level of arousal and orientation to time, place, and person were intact. Language including expression, naming, repetition, comprehension was assessed and found intact.  Cranial Nerves II - XII - II - Visual field intact OU. III, IV, VI - Extraocular movements intact. V - Facial sensation decreased on the left V2 V3. VII - Facial movement intact bilaterally. VIII - Hearing & vestibular intact bilaterally, no nystagmus. X - Palate elevates symmetrically. XI - Chin turning & shoulder shrug intact bilaterally. XII - Tongue protrusion intact.  Motor Strength - The patient's strength was normal in all extremities and pronator drift was absent.  Bulk was normal and fasciculations were absent.   Motor Tone - Muscle tone was assessed at the neck and appendages and was normal.  Reflexes - The patient's reflexes were symmetrical in all extremities and he had no pathological reflexes.  Sensory - Light touch, temperature/pinprick were assessed and were symmetrical.    Coordination - The  patient had mild ataxia or dysmetria on left hand FTN.  Tremor was absent.  Gait and Station - not tested due to dizziness.   ASSESSMENT/PLAN Alejandro Schneider is a 64 y.o. male with history of HLD admitted for acute onset dizziness, off balance, left face numbness, and vomiting. Symptoms gradually improved.    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  MRI  left lateral medullary infarct  MRA  left vertebral artery occlusion, right vertebral artery distal high-grade stenosis, basilar artery proximal high-grade stenosis.  CTA head and neck - similar as MRA, no significant carotid artery stenosis  2D Echo  unremarkable  LDL 121, not at goal  HgbA1c 5.6  Lovenox for VTE prophylaxis  Diet  Heart   no antithrombotic prior to admission, now on aspirin 81 mg orally every day and clopidogrel 75 mg orally every day. Will continue dural antiplatelet for 3 months and then Plavix alone.  Patient counseled to be compliant with his antithrombotic medications  Ongoing aggressive stroke risk factor management  Therapy recommendations:  CIR today  Disposition:  CIR  Hypertension  Home meds:   None  Currently on no blood pressure medications  Stable  Blood pressure goal 130-150 for at least 2-3 weeks to avoid posterior circulation hypoperfusion  Hyperlipidemia  Home meds:  None   LDL 121, goal < 70  Add Lipitor 20  Continue statin at discharge  Hiccups:  Most likely secondary to his recent brainstem stroke.  Assured patient that hiccups will resolve over time.  Not tolerating Thorazine  Increased baclofen to 20 mg tid.  Add low-dose Reglan  Other Stroke Risk Factors  Advanced age  Obesity, Body mass index is 29.99 kg/(m^2).   PLAN  Patient to go to [facility] today.  Dual antiplatelet therapy 3 months then Plavix alone.  Follow-up with Dr Erlinda Hong in 2 months  Hospital day # [facility] Pager [phone] 05/23/2014, 10:17 AM  I, the attending vascular neurologist, have personally obtained a history, examined the patient, evaluated laboratory data, individually viewed imaging studies and agree with radiology interpretations. Together with the NP/PA, we formulated the assessment and plan of care which reflects our mutual decision.  I have made any additions or clarifications directly to the above note and agree with the findings and plan as currently documented.   64 yo M with PMH of HLD admitted for left Wallenberg syndrome. MRA showed poor posterior circulation due to left VA occlusion, right VA and BA high grade stenosis. On dural antiplatelet, and symptoms better. Still has intractable hiccup. On  baclofen and add Reglan, not tolerating Thorazine. Avoid hypotension. waiting for CIR.   Rosalin Hawking, MD PhD Stroke Neurology 05/23/2014 2:35 PM         To contact Stroke Continuity provider, please refer to [email or web address] After hours, contact General Neurology

## 2014-05-23 NOTE — PMR Pre-admission (Signed)
PMR Admission Coordinator Pre-Admission Assessment  Patient: Alejandro Schneider is an 64 y.o., male MRN: [id number] DOB: June 30, 1950 Height: _0  (177.8 cm) Weight: 94.802 kg (209 lb)              Insurance Information HMO:      PPO:       PCP:       IPA:       80/20:       OTHER:  Group [id number] PRIMARY: U[employer] choice plus      Policy#: [id number]      Subscriber: Alejandro Schneider CM Name: Geryl Rankins      Phone#: [phone]     Fax#:   Pre-Cert#: [id number]      Employer: [employer] Benefits:  Phone #: [phone]     Name: Harrell Lark. Date: 04/05/14     Deduct: $5000 (Met $139.29)      Out of Pocket Max: [phone] (met $139.29)      Life Max: unlimited CIR: 80% w/auth      SNF: 80% w/auth max 150 days combined CIR/SNF Outpatient: 80%     Co-Pay: 20% Home Health: 80% w/auth      Co-Pay: 20% DME: 80%     Co-Pay: 20% Providers: in network  Emergency Contact Information Contact Information    Name Relation Home Work Mobile   Sugg,Susan Spouse [phone]  [phone]   Watt, Geiler   [phone]     Current Medical History  Patient Admitting Diagnosis:  L posterior medullary infarct  History of Present Illness: A 64 y.o.right handed male with history of hyperlipidemia. Patient independent prior to admission living with his wife working for [employer]. Admitted 05/20/2014 with dizziness, facial weakness and headache. 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 MRA of the head with occluded left vertebral artery as well as preocclusive stenosis of the right vertebrobasilar junction and possibly the proximal basilar artery. Echocardiogram with ejection fraction 94% grade 1 diastolic dysfunction. 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 Attempted stenting by interventional radiology 05/21/2014 was unsuccessful. Neurology follow-up currently maintained on aspirin and Plavix for CVA prophylaxis.  Subcutaneous Lovenox for DVT prophylaxis. Patient is tolerating a regular diet. Physical therapy evaluation completed 05/22/2014 with recommendations of physical medicine rehabilitation consult.    Total: 1=NIH  Past Medical History  Past Medical History  Diagnosis Date  . Hyperlipidemia     Family History  family history includes Cancer in his father; Depression in his mother; Diabetes in his cousin; Heart disease in his father and paternal grandfather; Prostate cancer in his father. There is no history of Hypertension, Drug abuse, Alcohol abuse, Stroke, or Colon cancer.  Prior Rehab/Hospitalizations:  None   Current Medications   Current facility-administered medications:  .   stroke: mapping our early stages of recovery book, , Does not apply, Once, Alexis Goodell, MD .  aspirin EC tablet 81 mg, 81 mg, Oral, Daily, Rosalin Hawking, MD, 81 mg at 05/23/14 0839 .  atorvastatin (LIPITOR) tablet 20 mg, 20 mg, Oral, q1800, Rosalin Hawking, MD, 20 mg at 05/22/14 1705 .  baclofen (LIORESAL) tablet 20 mg, 20 mg, Oral, TID, Rosalin Hawking, MD, 20 mg at 05/23/14 0839 .  clopidogrel (PLAVIX) tablet 75 mg, 75 mg, Oral, Daily, Rosalin Hawking, MD, 75 mg at 05/23/14 0839 .  enoxaparin (LOVENOX) injection 40 mg, 40 mg, Subcutaneous, Q24H, Rosalin Hawking, MD, 40 mg at 05/22/14 2116 .  famotidine (  PEPCID) tablet 20 mg, 20 mg, Oral, BID, Rosalin Hawking, MD, 20 mg at 05/23/14 0839 .  metoCLOPramide (REGLAN) tablet 5 mg, 5 mg, Oral, TID AC & HS, David L Rinehuls, PA-C, 5 mg at 05/23/14 0915 .  ondansetron (ZOFRAN) injection 4 mg, 4 mg, Intravenous, Q6H PRN, Rosalin Hawking, MD .  senna-docusate (Senokot-S) tablet 1 tablet, 1 tablet, Oral, QHS PRN, Alexis Goodell, MD  Patients Current Diet: Diet Heart  Precautions / Restrictions Precautions Precautions: Fall Precaution Comments: Pt with ataxia, diplopia, nystamgmus and impaired midline orientation  Restrictions Weight Bearing Restrictions: No   Prior Activity Level Community  (5-7x/wk): Went out daily.  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  Pt reports h/o shoulder injury  LUE Coordination: decreased gross motor (dysmetria )  Lower Extremity Assessment: Defer to PT evaluation    ADLs  Overall ADL's : Needs assistance/impaired Eating/Feeding: Modified independent, Sitting, Bed level Grooming: Wash/dry hands, Wash/dry face, Oral care, Min guard, Sitting Upper Body Bathing: Min guard, Sitting Lower Body Bathing: Moderate assistance, Sit to/from stand Upper Body Dressing : Minimal assistance, Sitting Lower Body Dressing: Moderate assistance, Sit to/from stand Toilet Transfer: Moderate assistance, Ambulation, Comfort height toilet, RW Toileting- Clothing Manipulation and Hygiene: Moderate assistance, Sit to/from stand Functional mobility during ADLs: Moderate assistance General ADL Comments: Pt requires mod A due to impaired balance     Mobility  Overal bed mobility: Needs Assistance Bed Mobility: Supine to Sit, Sit to Supine Supine to sit: Min guard Sit to supine: Supervision General bed mobility comments: in chair on arrival    Transfers  Overall transfer level: Needs assistance Equipment used: Rolling walker (2 wheeled) Transfers: Sit to/from Stand Sit to Stand: Min assist Stand pivot transfers: Mod assist General transfer comment: cues for hand placement and safety    Ambulation / Gait / Stairs  / Wheelchair Mobility  Ambulation/Gait Ambulation/Gait assistance: Mod assist Ambulation Distance (Feet): 70 Feet Assistive device: 1 person hand held assist Gait Pattern/deviations: Step-to pattern, Drifts right/left, Decreased stride length Gait velocity: slow Gait velocity interpretation: Below normal speed for age/gender General Gait Details: Did not attempt RW today based on instability on eval. With left hand held assist pt with min-mod assist for balance with increased left lean with increased gait and cues for midline with 2 LOB picking Right foot off the ground due to left lean. pt without scissoring and has to aim for right upper visual field to attempt gaze stabilization with nystagmus.pt with maintained right lateral neck flexion without cues to correct.     Posture / Balance Dynamic Sitting Balance Sitting balance - Comments: pt with midline posture edge of chair and able to perform bil UE reaching outside BOS without LoB Static Standing Balance Single Leg Stance - Right Leg: 2 Balance Overall balance assessment: Needs assistance Sitting-balance support: Feet supported Sitting balance-Leahy Scale: Good Sitting balance - Comments: pt with midline posture edge of chair and able to perform bil UE reaching outside BOS without LoB Postural control: Left lateral lean Standing balance support:  Bilateral upper extremity supported Standing balance-Leahy Scale: Poor Standing balance comment: left lean and when trunk in midline maintains weight shift to left.  Single Leg Stance - Right Leg: 2 High Level Balance Comments: Worked in standing at mirror and away for midline standing balance, orientation and weight shift. With bil UE support pt able to maintain midline posture and balance with cues grossly 15 sec before reverting to lean     Special needs/care consideration BiPAP/CPAP No CPM No Continuous Drip IV No Dialysis No       Life Vest No Oxygen No Special Bed No Trach Size  No Wound Vac (area) No       Skin No                              Bowel mgmt: Last BM 05/19/14 Bladder mgmt: Voiding in urinal Diabetic mgmt No    Previous Home Environment Living Arrangements: Spouse/significant other Home Care Services: No Additional Comments: lives with wife  Discharge Living Setting Plans for Discharge Living Setting: Patient's home, Lives with (comment), Mobile Home (Lives in a double wide mobile home.) Type of Home at Discharge: Mobile home Discharge Home Layout: One level Discharge Home Access: Stairs to enter Entrance Stairs-Number of Steps: 3 Does the patient have any problems obtaining your medications?: No  Social/Family/Support Systems Patient Roles: Spouse, Parent (Has a wife and a son.) Contact Information: Milan Perkins - wife Anticipated Caregiver: wife Anticipated Caregiver's Contact Information: Manuela Schwartz (h) [phone] (c) [phone] Ability/Limitations of Caregiver: Wife works 8 am to 5 pm but has a flexible job. Caregiver Availability: 24/7 (Wife will make arrangements as needed for patient.)  Son lives closeby but also works. Discharge Plan Discussed with Primary Caregiver: Yes Is Caregiver In Agreement with Plan?: Yes Does Caregiver/Family have Issues with Lodging/Transportation while Pt is in Rehab?: No  Goals/Additional Needs Patient/Family Goal for Rehab: PT/OT Supervision, No ST needs Expected length of stay: 10-14 days Cultural Considerations: None Dietary Needs: Heart diet, thin liquids Equipment Needs: TBD Pt/Family Agrees to Admission and willing to participate: Yes Program Orientation Provided & Reviewed with Pt/Caregiver Including Roles  & Responsibilities: Yes  Decrease burden of Care through IP rehab admission: N/A  Possible need for SNF placement upon discharge: Not anticipated  Patient Condition: This patient's condition remains as documented in the consult dated 05/22/14, in which the Rehabilitation Physician determined  and documented that the patient's condition is appropriate for intensive rehabilitative care in an inpatient rehabilitation facility. Will admit to inpatient rehab today.  Preadmission Screen Completed By:  [name], 05/23/2014 12:07 PM ______________________________________________________________________   Discussed status with Dr. Letta Pate on 05/23/14 at 1207 and received telephone approval for admission today.  Admission Coordinator:  [name], time1207/Date02/18/16

## 2014-05-23 NOTE — Progress Notes (Signed)
Occupational Therapy Treatment Patient Details Name: Alejandro Schneider MRN: [id number] DOB: [birth year] Today's Date: 05/23/2014    History of present illness Alejandro Schneider is an 64 y.o. male who was admitted 2/15 with  noted area of numbness along the left side of his nose and onto the top of his lip on the left. Then when the patient got up he was off balance and had difficulty walking. 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   OT comments  Pt with improved diplopia today - only occurs now when his is fatigued and with distant vision.  He however, is having significant c/o of things "moving" or bouncing when he tries to read for focus on activity - due to nystagmus.  Pt instructed in HEP for vision and was able to return demonstration.  Continue to recommend CIR.   Follow Up Recommendations  CIR    Equipment Recommendations  None recommended by OT    Recommendations for Other Services      Precautions / Restrictions Precautions Precautions: Fall Precaution Comments: Pt with ataxia, diplopia, nystamgmus and impaired midline orientation        Mobility Bed Mobility                  Transfers                      Balance                                   ADL                                                Vision                 Additional Comments: Unable to elicit diplopia this am.  Pt reports intermittent diplopia when he watches TV (distant vision), but it appears is only present now when he fatigues.  Instructed him to use the patch during those times as partial occlusion can't be tried/assessed at this time due to absence of diplopia.  Pt however, is reporting things are bouncing and moving.  He is unable to read even with his reading glasses in place. He is also reporting dizziness/spinning with head  movments.  Reinforced gaze fixation to reduce dizziness and instrcuted him on occulomotor exercises.  He was able to return demonstration.  In standing, pt continues to lean Lt with impaired midline orientation    Perception     Praxis      Cognition   Behavior During Therapy: [facility] for tasks assessed/performed;Flat affect Overall Cognitive Status: Within Functional Limits for tasks assessed                       Extremity/Trunk Assessment               Exercises     Shoulder Instructions       General Comments      Pertinent Vitals/ Pain       Pain Assessment: No/denies pain  Home Living  Prior Functioning/Environment              Frequency Min 2X/week     Progress Toward Goals  OT Goals(current goals can now be found in the care plan section)  Progress towards OT goals: Progressing toward goals  ADL Goals Pt Will Perform Grooming: with min guard assist;standing Pt Will Perform Lower Body Bathing: with min guard assist;sit to/from stand Pt Will Perform Lower Body Dressing: with min guard assist;sit to/from stand Pt Will Transfer to Toilet: with min guard assist;ambulating;regular height toilet;bedside commode;grab bars Pt Will Perform Toileting - Clothing Manipulation and hygiene: with min guard assist;sit to/from stand Additional ADL Goal #1: Pt will be independent with HEP for vision   Plan Discharge plan remains appropriate    Co-evaluation                 End of Session     Activity Tolerance     Patient Left     Nurse Communication          Time: 0981-1914 OT Time Calculation (min): 23 min  Charges: OT General Charges $OT Visit: 1 Procedure OT Treatments $Therapeutic Activity: 23-37 mins  Garlan Drewes M 05/23/2014, 11:10 AM

## 2014-05-23 NOTE — H&P (Signed)
Physical Medicine and Rehabilitation Admission H&P   Chief Complaint  Patient presents with  . Code Stroke  : Chief complaint: Weakness  HPI: Alejandro Schneider is a 64 y.o.right handed male with history of hyperlipidemia. Patient independent prior to admission living with his wife working for [employer] Admitted 05/20/2014 with dizziness, facial weakness and headache. 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 MRA of the head with occluded left vertebral artery as well as preocclusive stenosis of the right vertebrobasilar junction and possibly the proximal basilar artery. Echocardiogram with ejection fraction 32% grade 1 diastolic dysfunction. 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 Attempted stenting by interventional radiology 05/21/2014 was unsuccessful. There are considerations being made for possible re-attempted stenting in the next few weeks Neurology follow-up currently maintained on aspirin and Plavix for CVA prophylaxis and continue dual antiplatelets 3 months. Subcutaneous Lovenox for DVT prophylaxis. Patient is tolerating a regular diet. Physical and occupational therapy evaluations completed 05/22/2014 with recommendations of physical medicine rehabilitation consult. Patient was admitted for comprehensive rehabilitation program  No issues overnite Hiccups better , started Baclofen today Dizziness when he turns his head  ROS Review of Systems  Eyes: Positive for blurred vision.  Neurological: Positive for dizziness, weakness and headaches.  All other systems reviewed and are negative  Past Medical History  Diagnosis Date  . Hyperlipidemia    Past Surgical History  Procedure Laterality Date  . Tonsillectomy  1972  . Inguinal hernia repair  10/16/02    Right Dr Hassell Done  . Colonoscopy  2006  . Lumbar laminectomy/decompression microdiscectomy  Right 05/30/2012    Procedure: LUMBAR LAMINECTOMY/DECOMPRESSION MICRODISCECTOMY 1 LEVEL; Surgeon: Faythe Ghee, MD; Location: [facility]; Service: Neurosurgery; Laterality: Right;   Family History  Problem Relation Age of Onset  . Heart disease Father     CABGx4  . Cancer Father     Prostate (Radiation)  . Prostate cancer Father   . Heart disease Paternal Grandfather     MI  . Hypertension Neg Hx   . Drug abuse Neg Hx   . Alcohol abuse Neg Hx   . Stroke Neg Hx   . Colon cancer Neg Hx   . Diabetes Cousin   . Depression Mother    Social History:  reports that he has never smoked. He has never used smokeless tobacco. He reports that he drinks alcohol. He reports that he does not use illicit drugs. Allergies:  Allergies  Allergen Reactions  . Bee Venom     S/p allergy shots (and no reaction with stings after shots)   Medications Prior to Admission  Medication Sig Dispense Refill  . Multiple Vitamins-Minerals (MULTIVITAMIN,TX-MINERALS) tablet Take 1 tablet by mouth daily.     . clotrimazole-betamethasone (LOTRISONE) cream Apply 1 application topically 2 (two) times daily. 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 Transfers Sit to Stand: Min assist Stand pivot transfers: Mod assist General transfer comment: Pt requires mod A due to impaired balance and ataxia  Ambulation/Gait Ambulation/Gait assistance: +2 safety/equipment, Mod assist Ambulation Distance (Feet): 10 Feet (x2) Assistive device: Rolling walker (2 wheeled), None Gait Pattern/deviations: Step-to pattern, Decreased stride length, Drifts right/left Gait velocity: slow General Gait Details: attempted to ambulate without RW however pt with strong lean to left requiring modA to maintain upright posture. pt with scissored gait pattern and c/o dizziness of 5/10. pt with slightly improved with RW however con't to lean Left and even tips RW. Pt aware but unable to self correct    ADL: ADL Overall ADL's : Needs assistance/impaired Eating/Feeding: Modified independent, Sitting, Bed level Grooming: Wash/dry hands, Wash/dry face, Oral care, Min guard, Sitting Upper Body Bathing: Min guard, Sitting Lower Body Bathing: Moderate assistance, Sit to/from stand Upper Body Dressing : Minimal assistance, Sitting Lower Body Dressing: Moderate assistance, Sit to/from stand Toilet Transfer: Moderate assistance, Ambulation, Comfort height toilet, RW Toileting- Clothing Manipulation and Hygiene: Moderate assistance, Sit to/from stand Functional mobility during ADLs: Moderate assistance General ADL Comments: Pt requires mod A due to impaired balance   Cognition: Cognition Overall Cognitive Status: Within Functional Limits for tasks assessed Orientation Level: Oriented X4 Cognition Arousal/Alertness: Awake/alert Behavior During Therapy: WFL for tasks assessed/performed, Flat affect Overall Cognitive Status: Within Functional Limits for tasks assessed  Physical Exam: Blood pressure 132/76, pulse 62, temperature 98.2 F (36.8 C), temperature source Oral, resp. rate 18, height 5\' 10"  (1.778 m),  weight 94.802 kg (209 lb), SpO2 99 %. Physical Exam Constitutional: He is oriented to person, place, and time. He appears well-developed.  HENT:  Head: Normocephalic.  Eyes: EOM are normal.  Without nystagamus  Neck: Normal range of motion. Neck supple. No thyromegaly present.  Cardiovascular: Normal rate and regular rhythm.  Respiratory: Effort normal and breath sounds normal. No respiratory distress.  GI: Soft. Bowel sounds are normal. He exhibits no distension.  Neurological: He is alert and oriented to person, place, and time.  Follows commands.Decreased fine motor skills and somewhat ataxic   No evidence of ataxia right finger-nose-finger, moderate ataxia left finger-nose-finger Horizontal nystagmus to the right, no evidence of extraocular muscle weakness Neuro:  Tone is normal without evidence of spasticity Cerebellar exam shows no evidence of ataxia on finger nose finger or heel to shin testing on RIght Mild dysmetria Finger nose finger on left evidence of trunkal ataxia leans to left  Motor strength is 5/5 in bilateral deltoid, biceps, triceps, finger flexors and extensors, wrist flexors and extensors, hip flexors, knee flexors and extensors, ankle dorsiflexors, plantar flexors, invertors and evertors, toe flexors and extensors  Sensory exam is normal to light touch in the upper and lower limbs      Lab Results Last 48 Hours    Results for orders placed or performed during the hospital encounter of 05/20/14 (from the past 48 hour(s))  POCT Activated clotting time Status: None   Collection Time: 05/21/14 9:58 AM  Result Value Ref Range   Activated Clotting Time 128 seconds      Imaging Results (Last 48 hours)    Ir US Guide Vasc Access Right  05/21/2014 CLINICAL DATA: Vertebrobasilar ischemia. Occluded left vertebral artery. EXAM: IR ANGIO VERTEBRAL SEL VERTEBRAL UNI RIGHT MOD SED RIGHT VERTEBRAL ARTERY ANGIOGRAM: ANESTHESIA/SEDATION:  General anesthesia. MEDICATIONS: As per general anesthesia. CONTRAST: 15mL OMNIPAQUE IOHEXOL 300 MG/ML SOLN PROCEDURE: Following full  explanation of the procedure along with the potential associated complications, an informed witnessed consent was obtained. The patient was put under general anesthesia by the Department of Anesthesiology at [facility]. The right antecubital fossa was prepped and draped in the usual sterile fashion. Thereafter using a modified Seldinger technique, with ultrasound guidance, access into the right brachial artery was then obtained without difficulty. Over a 0.035 inch guidewire, a 5 French Pinnacle sheath was inserted. Through this, a 5 [place] JB1 catheter was then advanced and positioned in the right vertebral artery in its proximal 1/3 using biplane roadmap technique and constant fluoroscopic guidance. Arteriograms were then performed centered over the intracranial compartment. COMPLICATIONS: None immediate. FINDINGS: The right vertebral artery injection demonstrates the origin of the right vertebral artery to be normal. The vessel is otherwise seen to opacify normally to the cranial skull base. Opacification of the right vertebrobasilar junction is seen proximally. The right posterior-inferior cerebellar artery is seen to opacify proximally with a tapered severe stenosis associated with complete occlusion. Distal to this, the right vertebrobasilar junction demonstrates a right anterior-inferior cerebellar artery which appears to opacify the right posterior-inferior cerebellar artery distribution. Severe narrowing of the right vertebrobasilar junction is seen proximal to the confluence with the contralateral vertebral artery to form the basilar artery. The basilar artery itself proximally just distal to the confluence has a severe 95-97% stenosis with antegrade flow more distally to supply the left anterior-inferior cerebellar artery, and the superior cerebellar  artery. Transit opacification of the posterior communicating artery is seen on the right. Also seen is retrograde opacification of the left vertebrobasilar junction with opacification of the left posterior-inferior cerebellar artery. There is no clearance of contrast antegradely in the left vertebrobasilar junction consistent with a complete angiographic occlusion. 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 Severe pre-occlusive stenosis of the right vertebrobasilar junction just proximal to the confluence to form the basilar artery. 95-97% stenosis of the proximal basilar artery just distal to the confluence. Retrograde opacification of the left vertebrobasilar junction and left posterior-inferior cerebellar artery from the right vertebral artery injection. Electronically Signed By: Luanne Bras M.D. On: 05/21/2014 11:30   Ir Angio Vertebral Sel Vertebral Uni R Mod Sed  05/21/2014 CLINICAL DATA: Vertebrobasilar ischemia. Occluded left vertebral artery. EXAM: IR ANGIO VERTEBRAL SEL VERTEBRAL UNI RIGHT MOD SED RIGHT VERTEBRAL ARTERY ANGIOGRAM: ANESTHESIA/SEDATION: General anesthesia. MEDICATIONS: As per general anesthesia. CONTRAST: 18mL OMNIPAQUE IOHEXOL 300 MG/ML SOLN PROCEDURE: Following full explanation of the procedure along with the potential associated complications, an informed witnessed consent was obtained. The patient was put under general anesthesia by the Department of Anesthesiology at [facility]. The right antecubital fossa was prepped and draped in the usual sterile fashion. Thereafter using a modified Seldinger technique, with ultrasound guidance, access into the right brachial artery was then obtained without difficulty. Over a 0.035 inch guidewire, a 5 French Pinnacle sheath was inserted. Through this, a 5 [place] JB1 catheter was then advanced and positioned in the right vertebral artery in its proximal 1/3 using  biplane roadmap technique and constant fluoroscopic guidance. Arteriograms were then performed centered over the intracranial compartment. COMPLICATIONS: None immediate. FINDINGS: The right vertebral artery injection demonstrates the origin of the right vertebral artery to be normal. The vessel is otherwise seen to opacify normally to the cranial skull base. Opacification of the right vertebrobasilar junction is seen proximally. The right posterior-inferior cerebellar artery is seen to opacify proximally with a tapered severe stenosis associated with complete occlusion.  Distal to this, the right vertebrobasilar junction demonstrates a right anterior-inferior cerebellar artery which appears to opacify the right posterior-inferior cerebellar artery distribution. Severe narrowing of the right vertebrobasilar junction is seen proximal to the confluence with the contralateral vertebral artery to form the basilar artery. The basilar artery itself proximally just distal to the confluence has a severe 95-97% stenosis with antegrade flow more distally to supply the left anterior-inferior cerebellar artery, and the superior cerebellar artery. Transit opacification of the posterior communicating artery is seen on the right. Also seen is retrograde opacification of the left vertebrobasilar junction with opacification of the left posterior-inferior cerebellar artery. There is no clearance of contrast antegradely in the left vertebrobasilar junction consistent with a complete angiographic occlusion. 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 Severe pre-occlusive stenosis of the right vertebrobasilar junction just proximal to the confluence to form the basilar artery. 95-97% stenosis of the proximal basilar artery just distal to the confluence. Retrograde opacification of the left vertebrobasilar junction and left posterior-inferior cerebellar artery from the right vertebral  artery injection. Electronically Signed By: Luanne Bras M.D. On: 05/21/2014 11:30        Medical Problem List and Plan: 1. 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 2. DVT Prophylaxis/Anticoagulation: Subcutaneous Lovenox. Monitor platelet counts or any signs of bleeding 3. Pain Management: Tylenol as needed 4. Hyperlipidemia. Lipitor 5. Neuropsych: This patient is capable of making decisions on his own behalf. 6. Skin/Wound Care: Routine skin checks 7. Fluids/Electrolytes/Nutrition: Strict I&O with follow-up chemistries 8. Right vertebral and basilar artery stenosis. Initial attempts at stenting unsuccessful. Plan to discuss reattempts at stenting in the next few weeks with interventional radiology     Post Admission Physician Evaluation: 1. Functional deficits secondary to Left lateral medullary infarct left UE and trunkal ataxia. 2. Patient is admitted to receive collaborative, interdisciplinary care between the physiatrist, rehab nursing staff, and therapy team. 3. Patient's level of medical complexity and substantial therapy needs in context of that medical necessity cannot be provided at a lesser intensity of care such as a SNF. 4. Patient has experienced substantial functional loss from his/her baseline which was documented above under the "Functional History" and "Functional Status" headings. Judging by the patient's diagnosis, physical exam, and functional history, the patient has potential for functional progress which will result in measurable gains while on inpatient rehab. These gains will be of substantial and practical use upon discharge in facilitating mobility and self-care at the household level. 5. Physiatrist will provide 24 hour management of medical needs as well as oversight of the therapy plan/treatment and provide guidance as appropriate regarding the interaction  of the two. 6. 24 hour rehab nursing will assist with bladder management, bowel management, safety, skin/wound care, disease management, medication administration, pain management and patient education and help integrate therapy concepts, techniques,education, etc. 7. PT will assess and treat for/with: pre gait, gait training, endurance , safety, equipment, neuromuscular re education. Goals are: mod I. 8. OT will assess and treat for/with: ADLs, Cognitive perceptual skills, Neuromuscular re education, safety, endurance, equipment. Goals are: Mod I. Therapy may proceed with showering this patient. 9. SLP will assess and treat for/with: NA. Goals are: NA. 10. Case Management and Social Worker will assess and treat for psychological issues and discharge planning. 11. Team conference will be held weekly to assess progress toward goals and to determine barriers to discharge. 12. Patient will receive at least 3 hours of therapy  per day at least 5 days per week. 13. ELOS: 7d  14. Prognosis: excellent     [name] M.D. [facility] FAAPM&R (Sports Med, Neuromuscular Med) Diplomate Am Board of Electrodiagnostic Med  05/23/2014

## 2014-05-23 NOTE — Progress Notes (Signed)
Physical Therapy Treatment Patient Details Name: Alejandro Schneider MRN: [id number] DOB: 1950-10-10 Today's Date: 05/23/2014    History of Present Illness Alejandro Schneider is an 64 y.o. male who was admitted 2/15 with  noted area of numbness along the left side of his nose and onto the top of his lip on the left. Then when the patient got up he was off balance and had difficulty walking. 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    PT Comments    Pt moving well with increased balance, gait and stamina today. Pt continues to be limited due to nystagmus and left lean in standing. Pt encouraged to continue visual exercises, gaze stabilization, midline neck and back positioning in sitting. Will continue to follow. Wife present throughout and confirmed improved mobility from eval.   Follow Up Recommendations  CIR     Equipment Recommendations       Recommendations for Other Services       Precautions / Restrictions Precautions Precautions: Fall Precaution Comments: Pt with ataxia, diplopia, nystamgmus and impaired midline orientation     Mobility  Bed Mobility               General bed mobility comments: in chair on arrival  Transfers Overall transfer level: Needs assistance   Transfers: Sit to/from Stand Sit to Stand: Min assist         General transfer comment: cues for hand placement and safety  Ambulation/Gait Ambulation/Gait assistance: Mod assist Ambulation Distance (Feet): 70 Feet Assistive device: 1 person hand held assist Gait Pattern/deviations: Step-to pattern;Drifts right/left;Decreased stride length   Gait velocity interpretation: Below normal speed for age/gender General Gait Details: Did not attempt RW today based on instability on eval. With left hand held assist pt with min-mod assist for balance with increased left lean with increased gait and  cues for midline with 2 LOB picking Right foot off the ground due to left lean. pt without scissoring and has to aim for right upper visual field to attempt gaze stabilization with nystagmus.pt with maintained right lateral neck flexion without cues to correct.    Stairs            Wheelchair Mobility    Modified Rankin (Stroke Patients Only) Modified Rankin (Stroke Patients Only) Pre-Morbid Rankin Score: No symptoms Modified Rankin: Moderately severe disability     Balance Overall balance assessment: Needs assistance   Sitting balance-Leahy Scale: Good Sitting balance - Comments: pt with midline posture edge of chair and able to perform bil UE reaching outside BOS without LoB     Standing balance-Leahy Scale: Poor Standing balance comment: left lean and when trunk in midline maintains weight shift to left.  Single Leg Stance - Right Leg: 2             High Level Balance Comments: Worked in standing at mirror and away for midline standing balance, orientation and weight shift. With bil UE support pt able to maintain midline posture and balance with cues grossly 15 sec before reverting to lean     Cognition Arousal/Alertness: Awake/alert Behavior During Therapy: WFL for tasks assessed/performed Overall Cognitive Status: Within Functional Limits for tasks assessed                      Exercises      General Comments        Pertinent Vitals/Pain Pain Assessment:  No/denies pain    Home Living                      Prior Function            PT Goals (current goals can now be found in the care plan section) Progress towards PT goals: Progressing toward goals    Frequency       PT Plan Current plan remains appropriate    Co-evaluation             End of Session Equipment Utilized During Treatment: Gait belt Activity Tolerance: Patient tolerated treatment well Patient left: in chair;with call bell/phone within reach;with  family/visitor present;with chair alarm set     Time: 6468-0321 PT Time Calculation (min) (ACUTE ONLY): 26 min  Charges:  $Gait Training: 8-22 mins $Therapeutic Activity: 8-22 mins                    G Codes:      Melford Aase June 11, 2014, 11:38 AM Elwyn Reach, [place]

## 2014-05-23 NOTE — Progress Notes (Signed)
Rehab admissions - I have updated insurance case manager today.  I am waiting on case manager for decision regarding possible inpatient rehab admission today.  I will update all when I hear back from insurance carrier.  Call me for questions.  #[phone]

## 2014-05-23 NOTE — Progress Notes (Signed)
Patient is discharged from room 4N03 at this time. Alert and in stable condition. IV site d/c'd as well as tele. Report given to receiving nurse Anderson Malta, RN. Transferred to unit 4W at this time with all belongings and wife at side via bed.

## 2014-05-23 NOTE — Progress Notes (Signed)
Alejandro Diones, RN Rehab Admission Coordinator Signed Physical Medicine and Rehabilitation PMR Pre-admission 05/23/2014 10:36 AM  Related encounter: ED to Hosp-Admission (Current) from 05/20/2014 in [facility]   PMR Admission Coordinator Pre-Admission Assessment  Patient: Alejandro Schneider is an 64 y.o., male MRN: [id number] DOB: Mar 16, 1951 Height: '5\' 10"'  (177.8 cm) Weight: 94.802 kg (209 lb)  Insurance Information HMO: PPO: PCP: IPA: 80/20: OTHER: Group [id number] PRIMARY: UHC choice plus Policy#: [id number] Subscriber: Alejandro Schneider CM Name: Alejandro Schneider Phone#: [phone] Fax#:  Pre-Cert#: [id number] Employer: [employer] Benefits: Phone #: [phone] Name: Harrell Lark. Date: 04/05/14 Deduct: $5000 (Met $139.29) Out of Pocket Max: [phone] (met $139.29) Life Max: unlimited CIR: 80% w/auth SNF: 80% w/auth max 150 days combined CIR/SNF Outpatient: 80% Co-Pay: 20% Home Health: 80% w/auth Co-Pay: 20% DME: 80% Co-Pay: 20% Providers: in network  Emergency Contact Information Contact Information    Name Relation Home Work Mobile   Alejandro Schneider Spouse [phone]  [phone]   Alejandro Schneider   [phone]     Current Medical History  Patient Admitting Diagnosis: L posterior medullary infarct  History of Present Illness: A 64 y.o.right handed male with history of hyperlipidemia. Patient independent prior to admission living with his wife working for [employer] Admitted 05/20/2014 with dizziness, facial weakness and headache. 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 MRA of the head with occluded left vertebral artery as well as  preocclusive stenosis of the right vertebrobasilar junction and possibly the proximal basilar artery. Echocardiogram with ejection fraction 81% grade 1 diastolic dysfunction. 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 Attempted stenting by interventional radiology 05/21/2014 was unsuccessful. Neurology follow-up currently maintained on aspirin and Plavix for CVA prophylaxis. Subcutaneous Lovenox for DVT prophylaxis. Patient is tolerating a regular diet. Physical therapy evaluation completed 05/22/2014 with recommendations of physical medicine rehabilitation consult.   Total: 1=NIH  Past Medical History  Past Medical History  Diagnosis Date  . Hyperlipidemia     Family History  family history includes Cancer in his father; Depression in his mother; Diabetes in his cousin; Heart disease in his father and paternal grandfather; Prostate cancer in his father. There is no history of Hypertension, Drug abuse, Alcohol abuse, Stroke, or Colon cancer.  Prior Rehab/Hospitalizations: None  Current Medications   Current facility-administered medications:  . stroke: mapping our early stages of recovery book, , Does not apply, Once, Alejandro Goodell, MD . aspirin EC tablet 81 mg, 81 mg, Oral, Daily, Rosalin Hawking, MD, 81 mg at 05/23/14 0839 . atorvastatin (LIPITOR) tablet 20 mg, 20 mg, Oral, q1800, Rosalin Hawking, MD, 20 mg at 05/22/14 1705 . baclofen (LIORESAL) tablet 20 mg, 20 mg, Oral, TID, Rosalin Hawking, MD, 20 mg at 05/23/14 0839 . clopidogrel (PLAVIX) tablet 75 mg, 75 mg, Oral, Daily, Rosalin Hawking, MD, 75 mg at 05/23/14 0839 . enoxaparin (LOVENOX) injection 40 mg, 40 mg, Subcutaneous, Q24H, Rosalin Hawking, MD, 40 mg at 05/22/14 2116 . famotidine (PEPCID) tablet 20 mg, 20 mg, Oral, BID, Rosalin Hawking, MD, 20 mg at 05/23/14 0839 . metoCLOPramide (REGLAN) tablet 5 mg, 5 mg, Oral, TID AC & HS, David L Rinehuls, PA-C, 5 mg at  05/23/14 0915 . ondansetron (ZOFRAN) injection 4 mg, 4 mg, Intravenous, Q6H PRN, Rosalin Hawking, MD . senna-docusate (Senokot-S) tablet 1 tablet, 1 tablet, Oral, QHS PRN, Alejandro Goodell, MD  Patients Current Diet: Diet Heart  Precautions / Restrictions Precautions Precautions: Fall Precaution Comments: Pt with ataxia, diplopia, nystamgmus and impaired midline orientation  Restrictions Weight Bearing Restrictions: No   Prior Activity Level Community (5-7x/wk): Went out daily. 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 Pt reports h/o shoulder injury  LUE Coordination: decreased gross motor (dysmetria )  Lower Extremity Assessment: Defer to PT evaluation    ADLs  Overall ADL's : Needs assistance/impaired Eating/Feeding: Modified independent, Sitting, Bed level Grooming: Wash/dry hands, Wash/dry face, Oral care, Min guard, Sitting Upper Body Bathing: Min guard, Sitting Lower Body Bathing: Moderate assistance, Sit to/from stand Upper Body Dressing : Minimal assistance, Sitting Lower Body Dressing: Moderate assistance, Sit to/from stand Toilet Transfer: Moderate assistance, Ambulation, Comfort height toilet, RW Toileting- Clothing Manipulation and Hygiene: Moderate assistance, Sit to/from stand Functional mobility during ADLs: Moderate assistance General ADL Comments: Pt requires mod A due to impaired balance      Mobility  Overal bed mobility: Needs Assistance Bed Mobility: Supine to Sit, Sit to Supine Supine to sit: Min guard Sit to supine: Supervision General bed mobility comments: in chair on arrival    Transfers  Overall transfer level: Needs assistance Equipment used: Rolling walker (2 wheeled) Transfers: Sit to/from Stand Sit to Stand: Min assist Stand pivot transfers: Mod assist General transfer comment: cues for hand placement and safety    Ambulation / Gait / Stairs / Wheelchair Mobility  Ambulation/Gait Ambulation/Gait assistance: Mod assist Ambulation Distance (Feet): 70 Feet Assistive device: 1 person hand held assist Gait Pattern/deviations: Step-to pattern, Drifts right/left, Decreased stride length Gait velocity: slow Gait velocity interpretation: Below normal speed for age/gender General Gait Details: Did not attempt RW today based on instability on eval. With left hand held assist pt with min-mod assist for balance with increased left lean with increased gait and cues for midline with 2 LOB picking Right foot off the ground due to left lean. pt without scissoring and has to aim for right upper visual field to attempt gaze stabilization with nystagmus.pt with maintained right lateral neck flexion without cues to correct.     Posture / Balance Dynamic Sitting Balance Sitting balance - Comments: pt with midline posture edge of chair and able to perform bil UE reaching outside BOS without LoB Static Standing Balance Single Leg Stance - Right Leg: 2 Balance Overall balance assessment: Needs assistance Sitting-balance support: Feet supported Sitting balance-Leahy Scale: Good Sitting balance - Comments: pt with midline posture edge of chair and able to perform bil UE reaching outside BOS without LoB Postural control: Left lateral lean Standing balance support: Bilateral upper extremity supported Standing balance-Leahy Scale: Poor Standing balance comment: left lean  and when trunk in midline maintains weight shift to left.  Single Leg Stance - Right Leg: 2 High Level Balance Comments: Worked in standing at mirror and away for midline standing balance, orientation and weight shift. With bil UE support pt able to maintain midline posture and balance with cues grossly 15 sec before reverting to lean     Special needs/care consideration BiPAP/CPAP No  CPM No Continuous Drip IV No Dialysis No  Life Vest No Oxygen No Special Bed No Trach Size No Wound Vac (area) No  Skin No  Bowel mgmt: Last BM 05/19/14 Bladder mgmt: Voiding in urinal Diabetic mgmt No    Previous Home Environment Living Arrangements: Spouse/significant other Home Care Services: No Additional Comments: lives with wife  Discharge Living Setting Plans for Discharge Living Setting: Patient's home, Lives with (comment), Mobile Home (Lives in a double wide mobile home.) Type of Home at Discharge: Mobile home Discharge Home Layout: One level Discharge Home Access: Stairs to enter Entrance Stairs-Number of Steps: 3 Does the patient have any problems obtaining your medications?: No  Social/Family/Support Systems Patient Roles: Spouse, Parent (Has a wife and a son.) Contact Information: Renley Banwart - wife Anticipated Caregiver: wife Anticipated Caregiver's Contact Information: Manuela Schwartz (h) [phone] (c) [phone] Ability/Limitations of Caregiver: Wife works 8 am to 5 pm but has a flexible job. Caregiver Availability: 24/7 (Wife will make arrangements as needed for patient.) Son lives closeby but also works. Discharge Plan Discussed with Primary Caregiver: Yes Is Caregiver In Agreement with Plan?: Yes Does Caregiver/Family have Issues with Lodging/Transportation while Pt is in Rehab?: No  Goals/Additional Needs Patient/Family Goal for Rehab: PT/OT Supervision, No ST needs Expected length of stay: 10-14 days Cultural Considerations:  None Dietary Needs: Heart diet, thin liquids Equipment Needs: TBD Pt/Family Agrees to Admission and willing to participate: Yes Program Orientation Provided & Reviewed with Pt/Caregiver Including Roles & Responsibilities: Yes  Decrease burden of Care through IP rehab admission: N/A  Possible need for SNF placement upon discharge: Not anticipated  Patient Condition: This patient's condition remains as documented in the consult dated 05/22/14, in which the Rehabilitation Physician determined and documented that the patient's condition is appropriate for intensive rehabilitative care in an inpatient rehabilitation facility. Will admit to inpatient rehab today.  Preadmission Screen Completed By: Alejandro Schneider, 05/23/2014 12:07 PM ______________________________________________________________________  Discussed status with Dr. Letta Pate on 05/23/14 at 1207 and received telephone approval for admission today.  Admission Coordinator: Alejandro Schneider, time1207/Date02/18/16          Cosigned by: [name], MD at 05/23/2014 12:33 PM  Revision History     Date/Time User Provider Type Action   05/23/2014 12:33 PM [name], MD Physician Cosign   05/23/2014 12:07 PM Alejandro Diones, RN Rehab Admission Coordinator Sign

## 2014-05-23 NOTE — Progress Notes (Signed)
Speech Language Pathology   Patient Details Name: Alejandro Schneider MRN: [id number] DOB: 07/19/1950 Today's Date: 05/23/2014 Time:  -    Pt screened for speech-language-cognitive changes. No difficulty present. Wife has not observed difficulty with pt's cognition. Full assessment not necessary.   Orbie Pyo [place].[employer] [phone]

## 2014-05-24 ENCOUNTER — Inpatient Hospital Stay (HOSPITAL_COMMUNITY): Payer: 59 | Admitting: Physical Therapy

## 2014-05-24 ENCOUNTER — Inpatient Hospital Stay (HOSPITAL_COMMUNITY): Payer: 59 | Admitting: Occupational Therapy

## 2014-05-24 DIAGNOSIS — I63212 Cerebral infarction due to unspecified occlusion or stenosis of left vertebral arteries: Secondary | ICD-10-CM

## 2014-05-24 DIAGNOSIS — G463 Brain stem stroke syndrome: Secondary | ICD-10-CM

## 2014-05-24 DIAGNOSIS — G464 Cerebellar stroke syndrome: Secondary | ICD-10-CM

## 2014-05-24 DIAGNOSIS — I10 Essential (primary) hypertension: Secondary | ICD-10-CM | POA: Insufficient documentation

## 2014-05-24 LAB — CBC WITH DIFFERENTIAL/PLATELET
Basophils Absolute: 0 10*3/uL (ref 0.0–0.1)
Basophils Relative: 0 % (ref 0–1)
EOS ABS: 0.2 10*3/uL (ref 0.0–0.7)
EOS PCT: 2 % (ref 0–5)
HEMATOCRIT: 42.1 % (ref 39.0–52.0)
HEMOGLOBIN: 14.8 g/dL (ref 13.0–17.0)
LYMPHS PCT: 14 % (ref 12–46)
Lymphs Abs: 1.3 10*3/uL (ref 0.7–4.0)
MCH: 30.7 pg (ref 26.0–34.0)
MCHC: 35.2 g/dL (ref 30.0–36.0)
MCV: 87.3 fL (ref 78.0–100.0)
MONOS PCT: 8 % (ref 3–12)
Monocytes Absolute: 0.7 10*3/uL (ref 0.1–1.0)
Neutro Abs: 6.7 10*3/uL (ref 1.7–7.7)
Neutrophils Relative %: 76 % (ref 43–77)
Platelets: 202 10*3/uL (ref 150–400)
RBC: 4.82 MIL/uL (ref 4.22–5.81)
RDW: 12.9 % (ref 11.5–15.5)
WBC: 8.8 10*3/uL (ref 4.0–10.5)

## 2014-05-24 LAB — COMPREHENSIVE METABOLIC PANEL
ALT: 31 U/L (ref 0–53)
ANION GAP: 10 (ref 5–15)
AST: 25 U/L (ref 0–37)
Albumin: 4.1 g/dL (ref 3.5–5.2)
Alkaline Phosphatase: 69 U/L (ref 39–117)
BUN: 12 mg/dL (ref 6–23)
CALCIUM: 9.2 mg/dL (ref 8.4–10.5)
CO2: 21 mmol/L (ref 19–32)
Chloride: 108 mmol/L (ref 96–112)
Creatinine, Ser: 0.94 mg/dL (ref 0.50–1.35)
GFR calc Af Amer: >90 mL/min (ref >90)
GFR calc non Af Amer: 87 mL/min — ABNORMAL LOW (ref >90)
GLUCOSE: 159 mg/dL — AB (ref 70–99)
Potassium: 3.6 mmol/L (ref 3.5–5.1)
SODIUM: 139 mmol/L (ref 135–145)
TOTAL PROTEIN: 6.8 g/dL (ref 6.0–8.3)
Total Bilirubin: 1.5 mg/dL — ABNORMAL HIGH (ref 0.3–1.2)

## 2014-05-24 MED ORDER — BACLOFEN 20 MG PO TABS
20.0000 mg | ORAL_TABLET | Freq: Four times a day (QID) | ORAL | Status: DC
  Administered 2014-05-24 – 2014-05-29 (×19): 20 mg via ORAL
  Filled 2014-05-24 (×23): qty 1

## 2014-05-24 NOTE — Interval H&P Note (Signed)
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  The patient's history has been reviewed, patient examined, and there is no change in status.  Patient continues to be appropriate for intensive inpatient rehabilitation.  I have reviewed the patient's chart and labs.  Questions were answered to the patient's satisfaction.  Alysia Penna E 05/24/2014, 7:04 AM

## 2014-05-24 NOTE — Progress Notes (Signed)
Social Work Assessment and Plan Social Work Assessment and Plan  Patient Details  Name: Alejandro Schneider MRN: [id number] Date of Birth: [birth year]  Today's Date: 05/24/2014  Problem List:  Patient Active Problem List   Diagnosis Date Noted  . 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 CVA (cerebral infarction) 05/23/2014  . Basilar artery stenosis   . 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 Vertigo   . HLD (hyperlipidemia)   . 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 Intractable hiccups   . Stroke 05/20/2014  . Insufficiency of posterior brain circulation   . Rash and nonspecific skin eruption 04/10/2014  . Seborrheic keratoses 01/29/2014  . Skin irritation 09/06/2013  . Pain in joint, shoulder region 09/06/2013  . Nevus 08/26/2011  . FH: prostate cancer 08/26/2011  . Routine general medical examination at a health care facility 08/12/2010  . HYPERCHOLESTEROLEMIA 07/11/2007   Past Medical History:  Past Medical History  Diagnosis Date  . Hyperlipidemia    Past Surgical History:  Past Surgical History  Procedure Laterality Date  . Tonsillectomy  1972  . Inguinal hernia repair  10/16/02    Right  Dr Hassell Done  . Colonoscopy  2006  . Lumbar laminectomy/decompression microdiscectomy Right 05/30/2012    Procedure: LUMBAR LAMINECTOMY/DECOMPRESSION MICRODISCECTOMY 1 LEVEL;  Surgeon: Faythe Ghee, MD;  Location: [facility];  Service: Neurosurgery;  Laterality: Right;  . Radiology with anesthesia N/A 05/21/2014    Procedure: ANGIOPLASTY POSSIBLE STENT;  Surgeon: Rob Hickman, MD;  Location: [place];  Service: Radiology;  Laterality: N/A;   Social History:  reports that he has never smoked. He has never used smokeless tobacco. He reports that he drinks alcohol. He reports that he does not use illicit drugs.  Family / Support Systems Marital Status: Married Patient Roles: Spouse, Parent, Other (Comment), Volunteer  (Employee) Spouse/Significant Other: Manuela Schwartz [phone]  [phone] Children: Adam-son [phone] Other Supports: Friends and co-workers Anticipated Caregiver: Wife Ability/Limitations of Caregiver: Wife does owrk but is flexible in her job, awaiting team's goals to develop discharge plan Caregiver Availability: Other (Comment) (Wife and son to provide care awaiting team's goals) Family Dynamics: Close knit small family who are involved and supportive of one another.  Wife has stayed here with pt and provides emotional support.  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   Abuse/Neglect Physical Abuse: Denies Verbal Abuse: Denies Sexual Abuse: Denies Exploitation of patient/patient's resources: Denies Self-Neglect: Denies  Emotional Status Pt's affect, behavior adn adjustment status: Pt is motivated but annoyed by the hiccups he has since this stroke.  He is aware MD is trying medication to stop them.  He has always been independent and active and wants to get back to this.  He is not one to sit around and be idle. His wife reports he is very independent. Recent Psychosocial Issues: Thought his medical issues were managed until this.  He states: ": You just never know." Pyschiatric History: No history deferred depression screen at this time due to his hiccups and he doesn;t feel like talking much due  to them.  His wife feels he is still adjusting to this and is one to keep things inside.  Will monitor and have Neuro-psych see if  appropriate. Substance Abuse History: Social drinker but feels not an issue  Patient / Family Perceptions, Expectations & Goals Pt/Family understanding of illness & functional limitations: Pt and wif ecan explain his stroke and deficits as a result. They both have spoken with the MD and feel their questions and concerns are being addressed.  Wife has been her when the MD is rounding so she can ask him her questions. Premorbid pt/family roles/activities: Husband, father, Employee, Firefighter, church member, etc Anticipated changes in roles/activities/participation: resume Pt/family expectations/goals: Pt states: " I want to do for myself, that is my goal."  Wife states: " I can flex my schedule to accommodate his care needs."  " We are just hoping for the best."  [employer]: None Premorbid Home Care/DME Agencies: None Transportation available at discharge: [employer] referrals recommended: Neuropsychology, Support group (specify)  Discharge Planning Living Arrangements: Spouse/significant other Support Systems: Spouse/significant other, Children, Water engineer, Social worker community Type of Residence: Private residence Insurance underwriter Resources: Multimedia programmer (specify) Sports administrator) Financial Resources: Employment, Secondary school teacher Screen Referred: No Living Expenses: Own Money Management: Patient, Spouse Does the patient have any problems obtaining your medications?: No Home Management: Wife, pt does outside work Magazine features editor Plans: Return home with wife who is waiting on team's evaluations and based upon his goals establishing a plan.  Their son will help but works during the day.  Work on a Statistician. Wife is here to observe in therapies today and provide emotional support. Social Work Anticipated Follow Up Needs: HH/OP, Support Group  Clinical Impression Pleasant gentleman who has hiccups form his stroke, which is annoying him  and he tires not to talk much, allows wife to talk for him. Very supportive family who are willing to assist and adjust their schedules For him at home.  Will await team's evaluations and work on a discharge plan. Provide support to both throughout pt's stay.  Elease Hashimoto 05/24/2014, 10:33 AM

## 2014-05-24 NOTE — Progress Notes (Signed)
Patient information reviewed and entered into eRehab system by Orissa Arreaga, RN, CRRN, PPS Coordinator.  Information including medical coding and functional independence measure will be reviewed and updated through discharge.    

## 2014-05-24 NOTE — IPOC Note (Signed)
Overall Plan of Care [facility]) Patient Details Name: Alejandro Schneider MRN: [id number] DOB: 12/14/1950  Admitting Diagnosis: L CVA  Hospital Problems: Active Problems:   CVA (cerebral infarction)   Stroke, Wallenberg's syndrome     Functional Problem List: Nursing Bladder, Bowel, Endurance, Medication Management, Pain, Safety, Skin Integrity  PT Balance, Endurance, Motor, Perception, Safety  OT Balance, Motor, Sensory  SLP    TR         Basic ADL's: OT Grooming, Bathing, Dressing, Toileting     Advanced  ADL's: OT Simple Meal Preparation     Transfers: PT Bed Mobility, Bed to Chair, Car, [employer], Futures trader, Metallurgist: PT Ambulation, Stairs     Additional Impairments: OT Fuctional Use of Upper Extremity  SLP        TR      Anticipated Outcomes Item Anticipated Outcome  Self Feeding independent  Swallowing      Basic self-care  modified independent  Toileting  modified independent   Bathroom Transfers modified independent  Bowel/Bladder  Patient will be continent of bowel and bladder with min assist  Transfers  mod I  Locomotion  mod I ambulator  Communication     Cognition     Pain  Pain will be 3 or less on a scale of 0-10  Safety/Judgment  Patient will be free from falls/injury and display safety awareness with min assist   Therapy Plan: PT Intensity: Minimum of 1-2 x/day ,45 to 90 minutes PT Frequency: 5 out of 7 days PT Duration Estimated Length of Stay: 7-9 days OT Intensity: Minimum of 1-2 x/day, 45 to 90 minutes OT Frequency: 5 out of 7 days OT Duration/Estimated Length of Stay: 7-10 days         Team Interventions: Nursing Interventions Patient/Family Education, Bladder Management, Bowel Management, Disease Management/Prevention, Pain Management, Medication Management, Skin Care/Wound Management, Discharge Planning  PT interventions Ambulation/gait training, Balance/vestibular training, Community reintegration,  Discharge planning, Disease management/prevention, DME/adaptive equipment instruction, Functional mobility training, Neuromuscular re-education, Patient/family education, Stair training, Therapeutic Activities, Therapeutic Exercise, UE/LE Strength taining/ROM, UE/LE Coordination activities, Visual/perceptual remediation/compensation  OT Interventions Training and development officer, Academic librarian, Discharge planning, Functional mobility training, Patient/family education, Therapeutic Activities, UE/LE Strength taining/ROM, Therapeutic Exercise, Self Care/advanced ADL retraining, Neuromuscular re-education, DME/adaptive equipment instruction  SLP Interventions    TR Interventions    SW/CM Interventions Discharge Planning, Psychosocial Support, Patient/Family Education    Team Discharge Planning: Destination: PT-Home ,OT- Home , SLP-  Projected Follow-up: PT-Outpatient PT, OT-  Outpatient OT, SLP-  Projected Equipment Needs: PT-To be determined, OT- To be determined, SLP-  Equipment Details: PT- , OT-  Patient/family involved in discharge planning: PT- Patient, Family member/caregiver,  OT-Patient, Family member/caregiver, SLP-   MD ELOS: 7-9 days Medical Rehab Prognosis:  Excellent Assessment: The patient has been admitted for CIR therapies with the diagnosis of left CVA. The team will be addressing functional mobility, strength, stamina, balance, safety, adaptive techniques and equipment, self-care, bowel and bladder mgt, patient and caregiver education, NMR, visual-perceptual awareness, community reintegration, leisure awareness, ego support. Goals have been set at mod I for mobility and self-care tasks.    Meredith Staggers, MD, FAAPMR      See Team Conference Notes for weekly updates to the plan of care

## 2014-05-24 NOTE — Evaluation (Signed)
Occupational Therapy Assessment and Plan  Patient Details  Name: Alejandro Schneider MRN: [id number] Date of Birth: 1950/05/13  OT Diagnosis: disturbance of vision, hemiplegia affecting non-dominant side and muscle weakness (generalized) Rehab Potential: Rehab Potential (ACUTE ONLY): Excellent ELOS: 7-10 days   Today's Date: 05/24/2014 OT Individual Time: 0800-0900 OT Individual Time Calculation (min): 60 min     Problem List:  Patient Active Problem List   Diagnosis Date Noted  . 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 Essential hypertension   . CVA (cerebral infarction) 05/23/2014  . Basilar artery stenosis   . 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 Vertigo   . HLD (hyperlipidemia)   . 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 Intractable hiccups   . Stroke 05/20/2014  . Insufficiency of posterior brain circulation   . Rash and nonspecific skin eruption 04/10/2014  . Seborrheic keratoses 01/29/2014  . Skin irritation 09/06/2013  . Pain in joint, shoulder region 09/06/2013  . Nevus 08/26/2011  . FH: prostate cancer 08/26/2011  . Routine general medical examination at a health care facility 08/12/2010  . HYPERCHOLESTEROLEMIA 07/11/2007    Past Medical History:  Past Medical History  Diagnosis Date  . Hyperlipidemia    Past Surgical History:  Past Surgical History  Procedure Laterality Date  . Tonsillectomy  1972  . Inguinal hernia repair  10/16/02    Right  Dr Daphine Deutscher  . Colonoscopy  2006  . Lumbar laminectomy/decompression microdiscectomy Right 05/30/2012    Procedure: LUMBAR LAMINECTOMY/DECOMPRESSION MICRODISCECTOMY 1 LEVEL;  Surgeon: Reinaldo Meeker, MD;  Location: [facility];  Service: Neurosurgery;  Laterality: Right;  . Radiology with anesthesia N/A 05/21/2014    Procedure: ANGIOPLASTY POSSIBLE STENT;  Surgeon: Oneal Grout, MD;  Location: [facility] OR;  Service: Radiology;  Laterality: N/A;    Assessment  & Plan Clinical Impression: Patient is a 64 y.o. year old male with recent admission to the hospital on 05/20/2014 with dizziness, facial weakness and headache. 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 MRA of the head with occluded left vertebral artery as well as preocclusive stenosis of the right vertebrobasilar junction and possibly the proximal basilar artery. Echocardiogram with ejection fraction 65% grade 1 diastolic dysfunction. 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 Attempted stenting by interventional radiology 05/21/2014 was unsuccessful.  Patient transferred to CIR on 05/23/2014 .    Patient currently requires min with basic self-care skills secondary to muscle weakness, impaired timing and sequencing and decreased coordination, central origin and decreased standing balance, decreased postural control and decreased balance strategies.  Prior to hospitalization, patient could complete ADLS and IADLs with independent .  Patient will benefit from skilled intervention to decrease level of assist with basic self-care skills, increase independence with basic self-care skills and increase level of independence with iADL prior to discharge home with care partner.  Anticipate patient will require intermittent supervision and follow up outpatient.  OT - End of Session Activity Tolerance: Tolerates 30+ min activity with multiple rests Endurance Deficit: No (Not noted with ADL tasks) OT Assessment Rehab Potential (ACUTE ONLY): Excellent OT Patient demonstrates impairments in the following area(s): Balance;Motor;Sensory OT Basic ADL's Functional Problem(s): Grooming;Bathing;Dressing;Toileting OT Advanced ADL's Functional Problem(s): Simple Meal Preparation OT Transfers Functional Problem(s): Toilet;Tub/Shower OT Additional Impairment(s): Fuctional Use of Upper Extremity OT Plan OT Intensity: Minimum of 1-2  x/day, 45 to 90 minutes OT Frequency:  5 out of 7 days OT Duration/Estimated Length of Stay: 7-10 days OT Treatment/Interventions: Metallurgist training;Community reintegration;Discharge planning;Functional mobility training;Patient/family education;Therapeutic Activities;UE/LE Strength taining/ROM;Therapeutic Exercise;Self Care/advanced ADL retraining;Neuromuscular re-education;DME/adaptive equipment instruction OT Self Feeding Anticipated Outcome(s): independent OT Basic Self-Care Anticipated Outcome(s): modified independent OT Toileting Anticipated Outcome(s): modified independent OT Bathroom Transfers Anticipated Outcome(s): modified independent OT Recommendation Patient destination: Home Follow Up Recommendations: Outpatient OT Equipment Recommended: To be determined   Skilled Therapeutic Intervention Pt currently demonstrates increased LOB to the left side with dynamic standing tasks including selfcare tasks in standing.  He also continues to report blurry vision and objects in visual field not being stable.  No noted tracking deficits or nystagmus however.  May need continued evaluation through treatment.  Use of tub bench for safety during shower.  Pt able to perform LB dressing to donn underpants and shorts in standing but needed min assist for support.  He continues to exhibit hiccups as well.  Supportive wife who is already looking into DME needs.   OT Evaluation Precautions/Restrictions  Precautions Precautions: Fall Precaution Comments: leans to left Restrictions Weight Bearing Restrictions: No  Pain  No report of pain  Home Living/Prior Functioning Home Living Family/patient expects to be discharged to:: Private residence Living Arrangements: Spouse/significant other Available Help at Discharge: Family, Friend(s), Available PRN/intermittently Type of Home: House Home Access: Stairs to enter Secretary/administrator of Steps: 3 Entrance Stairs-Rails: None Home  Layout: One level Additional Comments: wife and firefighters/friends available to help as needed  Lives With: Spouse IADL History Current License: Yes Prior Function Level of Independence: Independent with basic ADLs, Independent with gait, Independent with transfers  Able to Take Stairs?: Yes Driving: Yes Vocation: Full time employment 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, water Leisure: Hobbies-yes (Comment) Comments: golf, riding tractor, riding motorcycle ADL  See FIM scale for details  Vision/Perception  Vision- History Baseline Vision/History: Wears glasses Wears Glasses: Reading only Patient Visual Report: Blurring of vision Vision- Assessment Vision Assessment?: Yes Eye Alignment: Within Functional Limits Ocular Range of Motion: Within Functional Limits Alignment/Gaze Preference: Within Defined Limits Tracking/Visual Pursuits: Able to track stimulus in all quads without difficulty Saccades: Within functional limits Convergence: Within functional limits Visual Fields: No apparent deficits Additional Comments: Pt without diplopia at this time but stil reports some "fuzziness" with visual acuity.  He also reported objects moving slightly when gazing in the left visual field but therapist did not not any consistent nystagmus.  Pt demonstrated VOR WFLs in both horizontal and vertical plane.  No nystagmus noted with head turns with and without gaze stabilization.  Will continue to monitor in function.    Cognition Overall Cognitive Status: Within Functional Limits for tasks assessed Orientation Level: Oriented X4 Attention: Selective Selective Attention: Appears intact Memory: Appears intact Awareness: Appears intact Problem Solving: Appears intact Safety/Judgment: Appears intact Sensation Sensation Light Touch: Impaired Detail Light Touch Impaired Details: Impaired LUE (Pt with reports of slight decreased acuity in the left  fingertips.) Hot/Cold: Appears Intact Proprioception: Appears Intact Coordination Gross Motor Movements are Fluid and Coordinated: Yes Fine Motor Movements are Fluid and Coordinated: Yes (very minimal dysmetria noted in the LUE when compared to the right.  Does not affectr functional abilities with selfcare tasks.  ) Motor  Motor Motor: Abnormal postural alignment and control Motor - Skilled Clinical Observations: Lateral lean to left in standing, impaired midline orientation, head tilt to the right in standing Mobility  Bed Mobility Bed Mobility: Supine to Sit;Sit to Supine Supine to  Sit: HOB flat;6: Modified independent (Device/Increase time) Sit to Supine: HOB flat;6: Modified independent (Device/Increase time)  Trunk/Postural Assessment  Cervical Assessment Cervical Assessment: Exceptions to [facility] Cervical Strength Overall Cervical Strength Comments: Pt with full AROM but tends to laterally flex his head to the right during mobility for compensation of balance.  Thoracic Assessment Thoracic Assessment: Within Functional Limits Lumbar Assessment Lumbar Assessment: Within Functional Limits Postural Control Postural Control: Deficits on evaluation Protective Responses: Pt with delayed balance reactions in standing, increased lean and LOB to the left.  Balance Balance Balance Assessed: Yes Dynamic Sitting Balance Dynamic Sitting - Balance Support: No upper extremity supported Dynamic Sitting - Level of Assistance: 6: Modified independent (Device/Increase time) Static Standing Balance Static Standing - Balance Support: Right upper extremity supported;Left upper extremity supported Static Standing - Level of Assistance: 5: Stand by assistance Dynamic Standing Balance Dynamic Standing - Balance Support: During functional activity;No upper extremity supported Dynamic Standing - Level of Assistance: 3: Mod assist Dynamic Standing - Comments: Pt with frequent LOB to the left in  standing without UE support or with mobility.    Extremity/Trunk Assessment RUE Assessment RUE Assessment: Within Functional Limits LUE Assessment LUE Assessment: Exceptions to [facility] LUE Strength LUE Overall Strength Comments: Pt with full AROM WFLS for all joints.  Strength 3+/5 in the shoulder but 4/5 for all other joints.   FIM:  FIM - Eating Eating Activity: 7: Complete independence:no helper FIM - Grooming Grooming Steps: Wash, rinse, dry face;Oral care, brush teeth, clean dentures;Brush, comb hair Grooming: 4: Patient completes 3 of 4 or 4 of 5 steps FIM - Bathing Bathing Steps Patient Completed: Chest;Right Arm;Left upper leg;Right lower leg (including foot);Left Arm;Abdomen;Front perineal area;Buttocks;Right upper leg;Left lower leg (including foot) Bathing: 4: Min-Patient completes 8-9 25f 10 parts or 75+ percent FIM - Upper Body Dressing/Undressing Upper body dressing/undressing steps patient completed: Thread/unthread right sleeve of pullover shirt/dresss;Thread/unthread left sleeve of pullover shirt/dress;Put head through opening of pull over shirt/dress;Pull shirt over trunk Upper body dressing/undressing: 5: Supervision: Safety issues/verbal cues FIM - Lower Body Dressing/Undressing Lower body dressing/undressing steps patient completed: Thread/unthread right underwear leg;Thread/unthread left underwear leg;Pull underwear up/down;Thread/unthread right pants leg;Thread/unthread left pants leg;Pull pants up/down;Don/Doff right sock;Don/Doff left sock;Don/Doff right shoe;Don/Doff left shoe;Fasten/unfasten right shoe;Fasten/unfasten left shoe Lower body dressing/undressing: 4: Min-Patient completed 75 plus % of tasks FIM - Bed/Chair Transfer Bed/Chair Transfer: 4: Bed > Chair or W/C: Min A (steadying Pt. > 75%);4: Chair or W/C > Bed: Min A (steadying Pt. > 75%) FIM - Tub/Shower Transfers Tub/Shower Assistive Devices: Tub transfer bench;Grab bars Tub/shower Transfers: 4-Out of  Tub/Shower: Min A (steadying Pt. > 75%/lift 1 leg);4-Into Tub/Shower: Min A (steadying Pt. > 75%/lift 1 leg)   Refer to Care Plan for Long Term Goals  Recommendations for other services: None  Discharge Criteria: Patient will be discharged from OT if patient refuses treatment 3 consecutive times without medical reason, if treatment goals not met, if there is a change in medical status, if patient makes no progress towards goals or if patient is discharged from hospital.  The above assessment, treatment plan, treatment alternatives and goals were discussed and mutually agreed upon: by patient  Braniyah Besse OTR/L 05/24/2014, 4:44 PM

## 2014-05-24 NOTE — Progress Notes (Signed)
64 y.o.right handed male with history of hyperlipidemia. Patient independent prior to admission living with his wife working for [employer] Admitted 05/20/2014 with dizziness, facial weakness and headache. 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 MRA of the head with occluded left vertebral artery as well as preocclusive stenosis of the right vertebrobasilar junction and possibly the proximal basilar artery. Echocardiogram with ejection fraction 67% grade 1 diastolic dysfunction. 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 Attempted stenting by interventional radiology 05/21/2014 was unsuccessful. There are considerations being made for possible re-attempted stenting in the next few weeks Neurology follow-up currently maintained on aspirin and Plavix for CVA prophylaxis and continue dual antiplatelets 3 months  Subjective/Complaints: Up all night with hiccups  Objective: Vital Signs: Blood pressure 139/89, pulse 64, temperature 98.7 F (37.1 C), temperature source Oral, resp. rate 18, height $RemoveBe'5\' 10"'vvtRCsRfL$  (1.778 m), weight 95.074 kg (209 lb 9.6 oz), SpO2 96 %. No results found. Results for orders placed or performed during the hospital encounter of 05/20/14 (from the past 72 hour(s))  POCT Activated clotting time     Status: None   Collection Time: 05/21/14  9:58 AM  Result Value Ref Range   Activated Clotting Time 128 seconds  CBC     Status: None   Collection Time: 05/23/14  8:06 AM  Result Value Ref Range   WBC 7.5 4.0 - 10.5 K/uL   RBC 4.68 4.22 - 5.81 MIL/uL   Hemoglobin 14.1 13.0 - 17.0 g/dL   HCT 41.4 39.0 - 52.0 %   MCV 88.5 78.0 - 100.0 fL   MCH 30.1 26.0 - 34.0 pg   MCHC 34.1 30.0 - 36.0 g/dL   RDW 13.1 11.5 - 15.5 %   Platelets 182 150 - 400 K/uL  Basic metabolic panel     Status: Abnormal   Collection Time: 05/23/14  8:06 AM  Result Value Ref Range   Sodium 140 135 - 145 mmol/L   Potassium 3.9 3.5 - 5.1 mmol/L   Chloride 110 96 - 112 mmol/L   CO2 21 19 - 32 mmol/L   Glucose, Bld 104 (H) 70 - 99 mg/dL   BUN 8 6 - 23 mg/dL   Creatinine, Ser 0.74 0.50 - 1.35 mg/dL   Calcium 8.9 8.4 - 10.5 mg/dL   GFR calc non Af Amer >90 >90 mL/min   GFR calc Af Amer >90 >90 mL/min    Comment: (NOTE) The eGFR has been calculated using the CKD EPI equation. This calculation has not been validated in all clinical situations. eGFR's persistently <90 mL/min signify possible Chronic Kidney Disease.    Anion gap 9 5 - 15     HEENT: normal Cardio: RRR and no murmur Resp: CTA B/L and unlabored GI: BS positive and NT, ND Extremity:  Pulses positive and No Edema Skin:   Intact Neuro: Alert/Oriented, Cranial Nerve II-XII normal, Abnormal Sensory reduced sensation RIght toes #1 and 2., Abnormal Motor decreased R foot toe flex/ext, Abnormal FMC Ataxic/ dec FMC and Other left lateral pulsion Musc/Skel:  Normal Gen NAD   Assessment/Plan: 1. 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 Physiatrist is providing close team supervision and 24 hour management of active medical problems listed below. Physiatrist and rehab team continue to assess barriers to discharge/monitor patient progress toward functional and medical goals. FIM:  Comprehension Comprehension Mode: Auditory Comprehension: 7-Follows complex conversation/direction: With no assist  Expression Expression Mode: Verbal Expression: 7-Expresses complex ideas: With no assist  Social Interaction Social Interaction: 7-Interacts appropriately with others - No medications needed.  Problem Solving Problem Solving Mode: Not assessed  Memory Memory Mode: Not assessed Medical Problem List and Plan: 1. Functional deficits secondary to left lateral medullary  infarct secondary to posterior circulation large vessel atherosclerosis. 2. DVT Prophylaxis/Anticoagulation: Subcutaneous Lovenox. Monitor platelet counts or any signs of bleeding 3. Pain Management: Tylenol as needed 4. Hyperlipidemia. Lipitor 5. Neuropsych: This patient is capable of making decisions on his own behalf. 6. Skin/Wound Care: Routine skin checks 7. Fluids/Electrolytes/Nutrition: Strict I&O with follow-up chemistries 8. Right vertebral and basilar artery stenosis. Initial attempts at stenting unsuccessful. Plan to discuss reattempts at stenting in the next few weeks with interventional radiology LOS (Days) 1 A FACE TO [place] E 05/24/2014, 7:06 AM

## 2014-05-24 NOTE — Care Management Note (Signed)
[facility] Individual Statement of Services  Patient Name:  Alejandro Schneider  Date:  05/24/2014  Welcome to the [facility].  Our goal is to provide you with an individualized program based on your diagnosis and situation, designed to meet your specific needs.  With this comprehensive rehabilitation program, you will be expected to participate in at least 3 hours of rehabilitation therapies Monday-Friday, with modified therapy programming on the weekends.  Your rehabilitation program will include the following services:  Physical Therapy (PT), Occupational Therapy (OT), Speech Therapy (ST), 24 hour per day rehabilitation nursing, Therapeutic Recreaction (TR), Neuropsychology, Case Management (Social Worker), Rehabilitation Medicine, Nutrition Services and Pharmacy Services  Weekly team conferences will be held on Wednesday to discuss your progress.  Your Social Worker will talk with you frequently to get your input and to update you on team discussions.  Team conferences with you and your family in attendance may also be held.  Expected length of stay: 7-10 days  Overall anticipated outcome: mod/i level  Depending on your progress and recovery, your program may change. Your Social Worker will coordinate services and will keep you informed of any changes. Your Social Worker's name and contact numbers are listed  below.  The following services may also be recommended but are not provided by the [facility] will be made to provide these services after discharge if needed.  Arrangements include referral to agencies that provide these services.  Your insurance has been verified to be:  [facility] Your primary doctor is:  Elsie Stain  Pertinent information will be shared with your doctor and your  insurance company.  Social Worker:  Ovidio Kin, [place] or [phone]  Information discussed with and copy given to patient by: Elease Hashimoto, 05/24/2014, 9:35 AM

## 2014-05-24 NOTE — Evaluation (Signed)
Physical Therapy Assessment and Plan  Patient Details  Name: Alejandro Schneider MRN: [id number] Date of Birth: [birth year]  PT Diagnosis: Abnormal posture, Abnormality of gait, Dizziness and giddiness and Vertigo of central origin, Decreased balance Rehab Potential: Excellent ELOS: 7-9 days   Today's Date: 05/24/2014 PT Individual Time: 6283-6629 PT Individual Time Calculation (min): 67 min    Problem List:  Patient Active Problem List   Diagnosis Date Noted  . 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 CVA (cerebral infarction) 05/23/2014  . Basilar artery stenosis   . 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 Vertigo   . HLD (hyperlipidemia)   . 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 Intractable hiccups   . Stroke 05/20/2014  . Insufficiency of posterior brain circulation   . Rash and nonspecific skin eruption 04/10/2014  . Seborrheic keratoses 01/29/2014  . Skin irritation 09/06/2013  . Pain in joint, shoulder region 09/06/2013  . Nevus 08/26/2011  . FH: prostate cancer 08/26/2011  . Routine general medical examination at a health care facility 08/12/2010  . HYPERCHOLESTEROLEMIA 07/11/2007    Past Medical History:  Past Medical History  Diagnosis Date  . Hyperlipidemia    Past Surgical History:  Past Surgical History  Procedure Laterality Date  . Tonsillectomy  1972  . Inguinal hernia repair  10/16/02    Right  Dr Hassell Done  . Colonoscopy  2006  . Lumbar laminectomy/decompression microdiscectomy Right 05/30/2012    Procedure: LUMBAR LAMINECTOMY/DECOMPRESSION MICRODISCECTOMY 1 LEVEL;  Surgeon: Faythe Ghee, MD;  Location: [facility];  Service: Neurosurgery;  Laterality: Right;  . Radiology with anesthesia N/A 05/21/2014    Procedure: ANGIOPLASTY POSSIBLE STENT;  Surgeon: Rob Hickman, MD;  Location: [place];  Service: Radiology;  Laterality: N/A;    Assessment & Plan Clinical Impression: Alejandro Schneider  is a 64 y.o.right handed male with history of hyperlipidemia. Patient independent prior to admission living with his wife working for [employer]. Admitted 05/20/2014 with dizziness, facial weakness and headache. 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 MRA of the head with occluded left vertebral artery as well as preocclusive stenosis of the right vertebrobasilar junction and possibly the proximal basilar artery. Echocardiogram with ejection fraction 64% grade 1 diastolic dysfunction. 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 Attempted stenting by interventional radiology 05/21/2014 was unsuccessful. There are considerations being made for possible re-attempted stenting in the next few weeks. Neurology follow-up currently maintained on aspirin and Plavix for CVA prophylaxis and continue dual antiplatelets 3 months. Subcutaneous Lovenox for DVT prophylaxis. Patient is tolerating a regular diet. Physical and occupational therapy evaluations completed 05/22/2014 with recommendations of physical medicine rehabilitation consult.Patient transferred to CIR on 05/23/2014 .   Patient currently requires max assist with mobility secondary to decreased balance, decreased balance strategies, decreased cardiorespiratoy endurance, decreased coordination, decreased visual acuity, decreased midline orientation and central origin vertigo.  Prior to hospitalization, patient was independent  with mobility and lived with Spouse in a House home.  Home access is 3Stairs to enter.  Patient will benefit from skilled PT intervention to maximize safe functional mobility, minimize fall risk and decrease caregiver burden for planned discharge home with intermittent assist.  Anticipate patient will benefit from follow up OP at discharge.  PT - End of Session Activity Tolerance: Decreased this session;Tolerates 30+ min activity with multiple  rests Endurance Deficit: Yes Endurance  Deficit Description: mildly impaired, requires cues to decrease pace and rest PT Assessment Rehab Potential (ACUTE/IP ONLY): Excellent Barriers to Discharge: Decreased caregiver support PT Patient demonstrates impairments in the following area(s): Balance;Endurance;Motor;Perception;Safety PT Transfers Functional Problem(s): Bed Mobility;Bed to Chair;Car;Furniture;Floor PT Locomotion Functional Problem(s): Ambulation;Stairs PT Plan PT Intensity: Minimum of 1-2 x/day ,45 to 90 minutes PT Frequency: 5 out of 7 days PT Duration Estimated Length of Stay: 7-9 days PT Treatment/Interventions: Ambulation/gait training;Balance/vestibular training;Community reintegration;Discharge planning;Disease management/prevention;DME/adaptive equipment instruction;Functional mobility training;Neuromuscular re-education;Patient/family education;Stair training;Therapeutic Activities;Therapeutic Exercise;UE/LE Strength taining/ROM;UE/LE Coordination activities;Visual/perceptual remediation/compensation PT Transfers Anticipated Outcome(s): mod I PT Locomotion Anticipated Outcome(s): mod I ambulator PT Recommendation Recommendations for Other Services: Vestibular eval Follow Up Recommendations: Outpatient PT Patient destination: Home Equipment Recommended: To be determined  Skilled Therapeutic Intervention Skilled therapeutic intervention initiated after completion of evaluation. Discussed with patient and wife falls risk, safety within room, and focus of therapy during stay. Discussed possible LOS, goals, and f/u therapy. Pt and wife verbalized understanding. Remainder of session focused on neuro re-education for weight shifting to R and abducting LLE during gait/stance for improved stability with mirror for visual feedback during gait with green line to keep wider BOS with LLE and cone taps with alternating BLE. For all activities, patient demonstrated excellent within session  carryover with initial max A for balance progressed to close supervision-min A. Pt ambulated back to room with close supervision and cues for L step width, weight shift to R, and cues to decrease pace. Pt's wife requesting to assist pt in room. Completed hands-on stand pivot transfer training with wife providing safe and appropriate assistance. Safety plan updated and patient left sitting in recliner with wife present and needs within reach.   PT Evaluation Precautions/Restrictions Precautions Precautions: Fall Precaution Comments: leans to left Restrictions Weight Bearing Restrictions: No General Chart Reviewed: Yes Family/Caregiver Present: Yes (wife Manuela Schwartz)   Pain Pain Assessment Pain Assessment: No/denies pain Home Living/Prior Functioning Home Living Available Help at Discharge: Family;Friend(s);Available PRN/intermittently Type of Home: House Home Access: Stairs to enter [employer] of Steps: 3 Entrance Stairs-Rails: None (plans to have rails installed) Home Layout: One level Additional Comments: wife and firefighters/friends available to help as needed  Lives With: Spouse Prior Function Level of Independence: Independent with basic ADLs;Independent with gait;Independent with transfers  Able to Take Stairs?: Yes Driving: Yes Vocation: Full time employment Vocation Requirements: Lineman at [employer] Leisure: Hobbies-yes (Comment) Comments: golf, riding tractor, riding motorcycle Vision/Perception  Vision - Assessment Eye Alignment: Within Functional Limits Ocular Range of Motion: Within Functional Limits Alignment/Gaze Preference: Within Defined Limits Tracking/Visual Pursuits: Able to track stimulus in all quads without difficulty Saccades: Within functional limits Convergence: Within functional limits Additional Comments: Pt without diplopia at this time but stil reports some "fuzziness" with visual acuity.  He also reported  objects moving slightly when gazing in the left visual field but therapist did not not any consistent nystagmus.  Pt demonstrated VOR WFLs in both horizontal and vertical plane.  No nystagmus noted with head turns with and without gaze stabilization.  Will continue to monitor in function.    Cognition Overall Cognitive Status: Within Functional Limits for tasks assessed Orientation Level: Oriented X4 Attention: Selective Selective Attention: Appears intact Memory: Appears intact Awareness: Appears intact Problem Solving: Appears intact Safety/Judgment: Appears intact Sensation Sensation Light Touch: Impaired Detail Light Touch Impaired Details: Impaired LUE (Pt with reports of slight decreased acuity in the left fingertips.) Hot/Cold: Appears Intact Proprioception: Appears Intact Coordination Gross Motor  Movements are Fluid and Coordinated: Yes Fine Motor Movements are Fluid and Coordinated: Yes (very minimal dysmetria noted in the LUE when compared to the right.  Does not affectr functional abilities with selfcare tasks.  ) Motor  Motor Motor: Abnormal postural alignment and control Motor - Skilled Clinical Observations: Lateral lean to left in standing, impaired midline orientation  Mobility Bed Mobility Bed Mobility: Supine to Sit;Sit to Supine Supine to Sit: HOB flat;6: Modified independent (Device/Increase time) Sit to Supine: HOB flat;6: Modified independent (Device/Increase time) Transfers Transfers: Yes Stand Pivot Transfers: 4: Min guard Locomotion  Ambulation Ambulation: Yes Ambulation/Gait Assistance: 3: Mod assist;2: Max assist Ambulation Distance (Feet): 150 Feet Assistive device: 1 person hand held assist (L HHA) Gait Gait: Yes Gait Pattern: Impaired Gait Pattern: Step-through pattern;Decreased step length - left;Decreased weight shift to right;Decreased stance time - right;Lateral trunk lean to left;Narrow base of support (adducted LLE) Gait velocity:  decreased Stairs / Additional Locomotion Stairs: Yes Stairs Assistance: 5: Supervision Stair Management Technique: Two rails;Alternating pattern;Forwards Number of Stairs: 15 Height of Stairs: 6 Ramp: Not tested (comment) Curb: Not tested (comment) Wheelchair Mobility Wheelchair Mobility: No (pt ambulatory)  Trunk/Postural Assessment  Cervical Assessment Cervical Assessment: Exceptions to [facility] Cervical Strength Overall Cervical Strength Comments: Pt with full AROM but tends to laterally flex his head to the right during mobility for compensation of balance.  Thoracic Assessment Thoracic Assessment: Within Functional Limits Lumbar Assessment Lumbar Assessment: Within Functional Limits Postural Control Postural Control: Deficits on evaluation Protective Responses: Pt with delayed balance reactions in standing, increased lean and LOB to the left.  Balance Balance Balance Assessed: Yes Standardized Balance Assessment Standardized Balance Assessment: Berg Balance Test Berg Balance Test Sit to Stand: Able to stand without using hands and stabilize independently Standing Unsupported: Able to stand 2 minutes with supervision Sitting with Back Unsupported but Feet Supported on Floor or Stool: Able to sit safely and securely 2 minutes Stand to Sit: Sits safely with minimal use of hands Transfers: Able to transfer with verbal cueing and /or supervision Standing Unsupported with Eyes Closed: Able to stand 10 seconds with supervision Standing Ubsupported with Feet Together: Needs help to attain position and unable to hold for 15 seconds From Standing, Reach Forward with Outstretched Arm: Reaches forward but needs supervision From Standing Position, Pick up Object from Floor: Able to pick up shoe, needs supervision From Standing Position, Turn to Look Behind Over each Shoulder: Turn sideways only but maintains balance Turn 360 Degrees: Needs close supervision or verbal cueing Standing  Unsupported, Alternately Place Feet on Step/Stool: Needs assistance to keep from falling or unable to try Standing Unsupported, One Foot in Front: Loses balance while stepping or standing Standing on One Leg: Unable to try or needs assist to prevent fall Total Score: 27 Static Standing Balance Static Standing - Balance Support: No upper extremity supported;During functional activity Static Standing - Level of Assistance: 5: Stand by assistance Dynamic Standing Balance Dynamic Standing - Balance Support: During functional activity;No upper extremity supported Dynamic Standing - Level of Assistance: 5: Stand by assistance;2: Max assist;3: Mod assist Extremity Assessment  RUE Assessment RUE Assessment: Within Functional Limits LUE Assessment LUE Assessment: Exceptions to [facility] LUE Strength LUE Overall Strength Comments: Pt with full AROM WFLS for all joints.  Strength 3+/5 in the shoulder but 4/5 for all other joints.  RLE Assessment RLE Assessment: Within Functional Limits LLE Assessment LLE Assessment: Within Functional Limits  FIM:  FIM - Bed/Chair Transfer Bed/Chair Transfer: 4: Bed >  Chair or W/C: Min A (steadying Pt. > 75%);4: Chair or W/C > Bed: Min A (steadying Pt. > 75%);6: Supine > Sit: No assist;6: Sit > Supine: No assist FIM - Locomotion: Wheelchair Locomotion: Wheelchair: 0: Activity did not occur (pt ambulatory) FIM - Locomotion: Ambulation Locomotion: Ambulation Assistive Devices: Other (comment) (L HHA) Ambulation/Gait Assistance: 3: Mod assist;2: Max assist Locomotion: Ambulation: 2: Travels 150 ft or more with maximal assistance (Pt: 25 - 49%) FIM - Locomotion: Stairs Locomotion: Stairs Assistive Devices: Hand rail - 2 Locomotion: Stairs: 5: Up and Down 12 - 14 stairs with supervision/safety concerns/verbal cues   Refer to Care Plan for Long Term Goals  Recommendations for other services: Other: Vestibular  Discharge Criteria: Patient will be discharged from PT if  patient refuses treatment 3 consecutive times without medical reason, if treatment goals not met, if there is a change in medical status, if patient makes no progress towards goals or if patient is discharged from hospital.  The above assessment, treatment plan, treatment alternatives and goals were discussed and mutually agreed upon: by patient and by family  Laretta Alstrom 05/24/2014, 12:32 PM

## 2014-05-24 NOTE — Progress Notes (Signed)
Occupational Therapy Session Note  Patient Details  Name: Alejandro Schneider MRN: [id number] Date of Birth: 1950/08/17  Today's Date: 05/24/2014 OT Individual Time: 1400-1500 OT Individual Time Calculation (min): 60 min    Short Term Goals: Week 1:     Skilled Therapeutic Interventions/Progress Updates:  Upon entering the room, pt seated in recliner chair awaiting therapist arrival. Several family members and friends present in room but quickly leaving once session starts. Pt with intermittent reports of blurred and double vision and using strategy of focusing on objects in order to clear vision. Pt ambulated 300+ feet this session and did not sit down to take rest break. Pt ambulating first to ADL kitchen with no use of AD but required Min - Mod A for balance as pt required assist to correct self when losing balance to the L. OT providing pt with verbal and physical cues for weight shifting, base of support, slowing down and taking larger steps while ambulating. Pt obtained microwave macaroni and cheese from cabinet, followed directions from therapist for steps, and cooked item with Min A for balance while ambulating in kitchen. Pt most often losing balance when turning towards the L and Pt having decreasing base of support with foot placement as turning. Pt ambulating in same manner as above to day room where he continued to stand at table top in order to engage in game of UNO with therapist. Cards placed to the far R of table causing pt to practice weight shift to R and back to midline. Pt ambulating back to room with increased difficulty with pt reporting increase in vision issues and requiring steady assist - Mod A for balance especially as pt having to move from side to side in hallway due to obstacles. Pt seated in recliner chair with friend remaining in room and call bell within reach.  Therapy Documentation Precautions:  Precautions Precautions: Fall Precaution Comments: leans to  left Restrictions Weight Bearing Restrictions: No Vital Signs: Therapy Vitals Temp: 98.6 F (37 C) Temp Source: Oral Pulse Rate: 87 Resp: 18 BP: (!) 161/92 mmHg (RN notified) Patient Position (if appropriate): Sitting Oxygen Therapy SpO2: 99 % O2 Device: Not Delivered  See FIM for current functional status  Therapy/Group: Individual Therapy  Phineas Semen 05/24/2014, 4:05 PM

## 2014-05-24 NOTE — H&P (View-Only) (Signed)
Physical Medicine and Rehabilitation Admission H&P   Chief Complaint  Patient presents with  . Code Stroke  : Chief complaint: Weakness  HPI: Alejandro Schneider is a 64 y.o.right handed male with history of hyperlipidemia. Patient independent prior to admission living with his wife working for [employer] Admitted 05/20/2014 with dizziness, facial weakness and headache. 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 MRA of the head with occluded left vertebral artery as well as preocclusive stenosis of the right vertebrobasilar junction and possibly the proximal basilar artery. Echocardiogram with ejection fraction 32% grade 1 diastolic dysfunction. 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 Attempted stenting by interventional radiology 05/21/2014 was unsuccessful. There are considerations being made for possible re-attempted stenting in the next few weeks Neurology follow-up currently maintained on aspirin and Plavix for CVA prophylaxis and continue dual antiplatelets 3 months. Subcutaneous Lovenox for DVT prophylaxis. Patient is tolerating a regular diet. Physical and occupational therapy evaluations completed 05/22/2014 with recommendations of physical medicine rehabilitation consult. Patient was admitted for comprehensive rehabilitation program  No issues overnite Hiccups better , started Baclofen today Dizziness when he turns his head  ROS Review of Systems  Eyes: Positive for blurred vision.  Neurological: Positive for dizziness, weakness and headaches.  All other systems reviewed and are negative  Past Medical History  Diagnosis Date  . Hyperlipidemia    Past Surgical History  Procedure Laterality Date  . Tonsillectomy  1972  . Inguinal hernia repair  10/16/02    Right Dr Hassell Done  . Colonoscopy  2006  . Lumbar laminectomy/decompression microdiscectomy  Right 05/30/2012    Procedure: LUMBAR LAMINECTOMY/DECOMPRESSION MICRODISCECTOMY 1 LEVEL; Surgeon: Faythe Ghee, MD; Location: [facility]; Service: Neurosurgery; Laterality: Right;   Family History  Problem Relation Age of Onset  . Heart disease Father     CABGx4  . Cancer Father     Prostate (Radiation)  . Prostate cancer Father   . Heart disease Paternal Grandfather     MI  . Hypertension Neg Hx   . Drug abuse Neg Hx   . Alcohol abuse Neg Hx   . Stroke Neg Hx   . Colon cancer Neg Hx   . Diabetes Cousin   . Depression Mother    Social History:  reports that he has never smoked. He has never used smokeless tobacco. He reports that he drinks alcohol. He reports that he does not use illicit drugs. Allergies:  Allergies  Allergen Reactions  . Bee Venom     S/p allergy shots (and no reaction with stings after shots)   Medications Prior to Admission  Medication Sig Dispense Refill  . Multiple Vitamins-Minerals (MULTIVITAMIN,TX-MINERALS) tablet Take 1 tablet by mouth daily.     . clotrimazole-betamethasone (LOTRISONE) cream Apply 1 application topically 2 (two) times daily. 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 Transfers Sit to Stand: Min assist Stand pivot transfers: Mod assist General transfer comment: Pt requires mod A due to impaired balance and ataxia  Ambulation/Gait Ambulation/Gait assistance: +2 safety/equipment, Mod assist Ambulation Distance (Feet): 10 Feet (x2) Assistive device: Rolling walker (2 wheeled), None Gait Pattern/deviations: Step-to pattern, Decreased stride length, Drifts right/left Gait velocity: slow General Gait Details: attempted to ambulate without RW however pt with strong lean to left requiring modA to maintain upright posture. pt with scissored gait pattern and c/o dizziness of 5/10. pt with slightly improved with RW however con't to lean Left and even tips RW. Pt aware but unable to self correct    ADL: ADL Overall ADL's : Needs assistance/impaired Eating/Feeding: Modified independent, Sitting, Bed level Grooming: Wash/dry hands, Wash/dry face, Oral care, Min guard, Sitting Upper Body Bathing: Min guard, Sitting Lower Body Bathing: Moderate assistance, Sit to/from stand Upper Body Dressing : Minimal assistance, Sitting Lower Body Dressing: Moderate assistance, Sit to/from stand Toilet Transfer: Moderate assistance, Ambulation, Comfort height toilet, RW Toileting- Clothing Manipulation and Hygiene: Moderate assistance, Sit to/from stand Functional mobility during ADLs: Moderate assistance General ADL Comments: Pt requires mod A due to impaired balance   Cognition: Cognition Overall Cognitive Status: Within Functional Limits for tasks assessed Orientation Level: Oriented X4 Cognition Arousal/Alertness: Awake/alert Behavior During Therapy: WFL for tasks assessed/performed, Flat affect Overall Cognitive Status: Within Functional Limits for tasks assessed  Physical Exam: Blood pressure 132/76, pulse 62, temperature 98.2 F (36.8 C), temperature source Oral, resp. rate 18, height 5\' 10"  (1.778 m),  weight 94.802 kg (209 lb), SpO2 99 %. Physical Exam Constitutional: He is oriented to person, place, and time. He appears well-developed.  HENT:  Head: Normocephalic.  Eyes: EOM are normal.  Without nystagamus  Neck: Normal range of motion. Neck supple. No thyromegaly present.  Cardiovascular: Normal rate and regular rhythm.  Respiratory: Effort normal and breath sounds normal. No respiratory distress.  GI: Soft. Bowel sounds are normal. He exhibits no distension.  Neurological: He is alert and oriented to person, place, and time.  Follows commands.Decreased fine motor skills and somewhat ataxic   No evidence of ataxia right finger-nose-finger, moderate ataxia left finger-nose-finger Horizontal nystagmus to the right, no evidence of extraocular muscle weakness Neuro:  Tone is normal without evidence of spasticity Cerebellar exam shows no evidence of ataxia on finger nose finger or heel to shin testing on RIght Mild dysmetria Finger nose finger on left evidence of trunkal ataxia leans to left  Motor strength is 5/5 in bilateral deltoid, biceps, triceps, finger flexors and extensors, wrist flexors and extensors, hip flexors, knee flexors and extensors, ankle dorsiflexors, plantar flexors, invertors and evertors, toe flexors and extensors  Sensory exam is normal to light touch in the upper and lower limbs      Lab Results Last 48 Hours    Results for orders placed or performed during the hospital encounter of 05/20/14 (from the past 48 hour(s))  POCT Activated clotting time Status: None   Collection Time: 05/21/14 9:58 AM  Result Value Ref Range   Activated Clotting Time 128 seconds      Imaging Results (Last 48 hours)    Ir US Guide Vasc Access Right  05/21/2014 CLINICAL DATA: Vertebrobasilar ischemia. Occluded left vertebral artery. EXAM: IR ANGIO VERTEBRAL SEL VERTEBRAL UNI RIGHT MOD SED RIGHT VERTEBRAL ARTERY ANGIOGRAM: ANESTHESIA/SEDATION:  General anesthesia. MEDICATIONS: As per general anesthesia. CONTRAST: 57mL OMNIPAQUE IOHEXOL 300 MG/ML SOLN PROCEDURE: Following full  explanation of the procedure along with the potential associated complications, an informed witnessed consent was obtained. The patient was put under general anesthesia by the Department of Anesthesiology at [facility]. The right antecubital fossa was prepped and draped in the usual sterile fashion. Thereafter using a modified Seldinger technique, with ultrasound guidance, access into the right brachial artery was then obtained without difficulty. Over a 0.035 inch guidewire, a 5 French Pinnacle sheath was inserted. Through this, a 5 [place] JB1 catheter was then advanced and positioned in the right vertebral artery in its proximal 1/3 using biplane roadmap technique and constant fluoroscopic guidance. Arteriograms were then performed centered over the intracranial compartment. COMPLICATIONS: None immediate. FINDINGS: The right vertebral artery injection demonstrates the origin of the right vertebral artery to be normal. The vessel is otherwise seen to opacify normally to the cranial skull base. Opacification of the right vertebrobasilar junction is seen proximally. The right posterior-inferior cerebellar artery is seen to opacify proximally with a tapered severe stenosis associated with complete occlusion. Distal to this, the right vertebrobasilar junction demonstrates a right anterior-inferior cerebellar artery which appears to opacify the right posterior-inferior cerebellar artery distribution. Severe narrowing of the right vertebrobasilar junction is seen proximal to the confluence with the contralateral vertebral artery to form the basilar artery. The basilar artery itself proximally just distal to the confluence has a severe 95-97% stenosis with antegrade flow more distally to supply the left anterior-inferior cerebellar artery, and the superior cerebellar  artery. Transit opacification of the posterior communicating artery is seen on the right. Also seen is retrograde opacification of the left vertebrobasilar junction with opacification of the left posterior-inferior cerebellar artery. There is no clearance of contrast antegradely in the left vertebrobasilar junction consistent with a complete angiographic occlusion. 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 Severe pre-occlusive stenosis of the right vertebrobasilar junction just proximal to the confluence to form the basilar artery. 95-97% stenosis of the proximal basilar artery just distal to the confluence. Retrograde opacification of the left vertebrobasilar junction and left posterior-inferior cerebellar artery from the right vertebral artery injection. Electronically Signed By: Luanne Bras M.D. On: 05/21/2014 11:30   Ir Angio Vertebral Sel Vertebral Uni R Mod Sed  05/21/2014 CLINICAL DATA: Vertebrobasilar ischemia. Occluded left vertebral artery. EXAM: IR ANGIO VERTEBRAL SEL VERTEBRAL UNI RIGHT MOD SED RIGHT VERTEBRAL ARTERY ANGIOGRAM: ANESTHESIA/SEDATION: General anesthesia. MEDICATIONS: As per general anesthesia. CONTRAST: 16mL OMNIPAQUE IOHEXOL 300 MG/ML SOLN PROCEDURE: Following full explanation of the procedure along with the potential associated complications, an informed witnessed consent was obtained. The patient was put under general anesthesia by the Department of Anesthesiology at [facility]. The right antecubital fossa was prepped and draped in the usual sterile fashion. Thereafter using a modified Seldinger technique, with ultrasound guidance, access into the right brachial artery was then obtained without difficulty. Over a 0.035 inch guidewire, a 5 French Pinnacle sheath was inserted. Through this, a 5 [place] JB1 catheter was then advanced and positioned in the right vertebral artery in its proximal 1/3 using  biplane roadmap technique and constant fluoroscopic guidance. Arteriograms were then performed centered over the intracranial compartment. COMPLICATIONS: None immediate. FINDINGS: The right vertebral artery injection demonstrates the origin of the right vertebral artery to be normal. The vessel is otherwise seen to opacify normally to the cranial skull base. Opacification of the right vertebrobasilar junction is seen proximally. The right posterior-inferior cerebellar artery is seen to opacify proximally with a tapered severe stenosis associated with complete occlusion.  Distal to this, the right vertebrobasilar junction demonstrates a right anterior-inferior cerebellar artery which appears to opacify the right posterior-inferior cerebellar artery distribution. Severe narrowing of the right vertebrobasilar junction is seen proximal to the confluence with the contralateral vertebral artery to form the basilar artery. The basilar artery itself proximally just distal to the confluence has a severe 95-97% stenosis with antegrade flow more distally to supply the left anterior-inferior cerebellar artery, and the superior cerebellar artery. Transit opacification of the posterior communicating artery is seen on the right. Also seen is retrograde opacification of the left vertebrobasilar junction with opacification of the left posterior-inferior cerebellar artery. There is no clearance of contrast antegradely in the left vertebrobasilar junction consistent with a complete angiographic occlusion. 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 Severe pre-occlusive stenosis of the right vertebrobasilar junction just proximal to the confluence to form the basilar artery. 95-97% stenosis of the proximal basilar artery just distal to the confluence. Retrograde opacification of the left vertebrobasilar junction and left posterior-inferior cerebellar artery from the right vertebral  artery injection. Electronically Signed By: Luanne Bras M.D. On: 05/21/2014 11:30        Medical Problem List and Plan: 1. 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 2. DVT Prophylaxis/Anticoagulation: Subcutaneous Lovenox. Monitor platelet counts or any signs of bleeding 3. Pain Management: Tylenol as needed 4. Hyperlipidemia. Lipitor 5. Neuropsych: This patient is capable of making decisions on his own behalf. 6. Skin/Wound Care: Routine skin checks 7. Fluids/Electrolytes/Nutrition: Strict I&O with follow-up chemistries 8. Right vertebral and basilar artery stenosis. Initial attempts at stenting unsuccessful. Plan to discuss reattempts at stenting in the next few weeks with interventional radiology     Post Admission Physician Evaluation: 1. Functional deficits secondary to Left lateral medullary infarct left UE and trunkal ataxia. 2. Patient is admitted to receive collaborative, interdisciplinary care between the physiatrist, rehab nursing staff, and therapy team. 3. Patient's level of medical complexity and substantial therapy needs in context of that medical necessity cannot be provided at a lesser intensity of care such as a SNF. 4. Patient has experienced substantial functional loss from his/her baseline which was documented above under the "Functional History" and "Functional Status" headings. Judging by the patient's diagnosis, physical exam, and functional history, the patient has potential for functional progress which will result in measurable gains while on inpatient rehab. These gains will be of substantial and practical use upon discharge in facilitating mobility and self-care at the household level. 5. Physiatrist will provide 24 hour management of medical needs as well as oversight of the therapy plan/treatment and provide guidance as appropriate regarding the interaction  of the two. 6. 24 hour rehab nursing will assist with bladder management, bowel management, safety, skin/wound care, disease management, medication administration, pain management and patient education and help integrate therapy concepts, techniques,education, etc. 7. PT will assess and treat for/with: pre gait, gait training, endurance , safety, equipment, neuromuscular re education. Goals are: mod I. 8. OT will assess and treat for/with: ADLs, Cognitive perceptual skills, Neuromuscular re education, safety, endurance, equipment. Goals are: Mod I. Therapy may proceed with showering this patient. 9. SLP will assess and treat for/with: NA. Goals are: NA. 10. Case Management and Social Worker will assess and treat for psychological issues and discharge planning. 11. Team conference will be held weekly to assess progress toward goals and to determine barriers to discharge. 12. Patient will receive at least 3 hours of therapy  per day at least 5 days per week. 13. ELOS: 7d  14. Prognosis: excellent     [name] M.D. [facility] FAAPM&R (Sports Med, Neuromuscular Med) Diplomate [place] Board of Electrodiagnostic Med  05/23/2014

## 2014-05-24 NOTE — Plan of Care (Signed)
Problem: RH PAIN MANAGEMENT Goal: RH STG PAIN MANAGED AT OR BELOW PT'S PAIN GOAL <3 Outcome: Progressing No c/o pain     

## 2014-05-24 NOTE — Plan of Care (Signed)
Problem: RH BOWEL ELIMINATION Goal: RH STG MANAGE BOWEL WITH ASSISTANCE STG Manage Bowel with Assistance.  Outcome: Not Progressing No BM since 2/14

## 2014-05-25 ENCOUNTER — Encounter (HOSPITAL_COMMUNITY): Payer: 59 | Admitting: Occupational Therapy

## 2014-05-25 ENCOUNTER — Inpatient Hospital Stay (HOSPITAL_COMMUNITY): Payer: 59 | Admitting: Occupational Therapy

## 2014-05-25 ENCOUNTER — Inpatient Hospital Stay (HOSPITAL_COMMUNITY): Payer: 59 | Admitting: Physical Therapy

## 2014-05-25 DIAGNOSIS — I639 Cerebral infarction, unspecified: Secondary | ICD-10-CM

## 2014-05-25 MED ORDER — GABAPENTIN 100 MG PO CAPS
100.0000 mg | ORAL_CAPSULE | Freq: Three times a day (TID) | ORAL | Status: DC
  Administered 2014-05-25 (×3): 100 mg via ORAL
  Filled 2014-05-25 (×7): qty 1

## 2014-05-25 NOTE — Progress Notes (Signed)
Occupational Therapy Session Note  Patient Details  Name: Alejandro Schneider MRN: [id number] Date of Birth: October 19, 1950  Today's Date: 05/25/2014 OT Individual Time: 0900-1001 OT Individual Time Calculation (min): 61 min    Short Term Goals: Week 1:  OT Short Term Goal 1 (Week 1): STGs equal to LTGs set at modified independent level  Skilled Therapeutic Interventions/Progress Updates:    Pt reporting at beginning of session that he did not sleep well and that his eyes were moving around more this am.  Therapist noted bilateral nystagmus in vertical/rotational pattern.  Currently difficult to determine direction as nystagmus beating is very small in movement but is occuring all the time even at rest with midline gaze.  Greater nystagmus noted with rolling to the right side of bed with HOB flat.  It actually decreased in intensity briefly with rolling to the left.  Pt transitioned to sitting with modified independent and ambulated to the shower with min assist.  He was able to complete all bathing with supervision and use of the grab bar for support.  He returned to the edge of the bed for dressing with min guard assist.  Noted pt with short step length and wide BOS during mobility in the room.  Left lean not as prominent during this session as yesterday, however it increased once he ambulated outside of the room.  Mod assist overall to walk to the gym.  Educated pt on fixed gaze with mobility in hopes of decreasing nystagmus.  Pt reporting that when he initially looks at a fixed object the nystagmus decreases, however within seconds it returns, even when continuing to stare at the object.  Encouraged pt to continue working on gaze stabilization exercises.  Noted pt stating that his nystagmus is worse today than it was yesterday.  During eval this therapist did not note any yesterday however pt did report that his eyes were moving when he gazed to the left.  Worked briefly in standing on lateral weightshifts  to the right to help increase weightshift for standing balance.  Will focus more on this during second session.  Pt returned to room with mod assist for balance.  Therapy Documentation Precautions:  Precautions Precautions: Fall Precaution Comments: leans to left Restrictions Weight Bearing Restrictions: No  Pain: Pain Assessment Pain Assessment: No/denies pain ADL: See FIM for current functional status  Therapy/Group: Individual Therapy  Shamina Etheridge OTR/L 05/25/2014, 10:28 AM

## 2014-05-25 NOTE — Progress Notes (Signed)
Physical Therapy Vestibular Assessment  Patient Details  Name: Alejandro Schneider MRN: [id number] Date of Birth: 08-27-1950  Today's Date: 05/25/2014 PT Individual Time: 1100-1200 PT Individual Time Calculation (min): 60 min   Short Term Goals: Week 1:  PT Short Term Goal 1 (Week 1): = LTGs due to anticipated short LOS  Subjective: Pt reporting increased fatigue today secondary to hiccups disturbing sleep last night and reporting slight increase in symptoms of midline HA, visual impairments and dysequilibrium in sitting and standing.  Vitals assessed; see below, WFL.  Pt denies nausea or vomiting.  He is not on any anti vertiginous medications. Pt agreeable to vestibular evaluation.  Pt main c/o is of objects in vision bouncing (oscillopscia) which is constant.  Pt denies diplopia.  Convergence WFL.  Pt tends to hold head in R rotation and lateral flexion; when head manually placed in neutral pt reports feeling that he is being tipped to the L.    Gait: pt presents with R ABD and L ADD during swing with L lateropulsion and decreased weight shifting and stance time on RLE.  Pt reports focusing on visual vertical target does not help secondary to target bouncing.  With head turns pt presents with increased lateropulsion to L.  Pt required mod-max A during gait for stability and cues for increased BOS, stance time R and increased step length LLE.         Eye Alignment  With head in neutral eyes shift to L, downward gaze  Spontaneous  Nystagmus Present and constant  Gaze holding nystagmus Present; horizontal, direction changing  Smooth pursuit Smooth but nystagmus at end range  Oculomotor  WFL  Saccades  No over shooting or undershooting but nystagmus noted  VOR slow WFL slow; at faster speeds and with cues to increase ROM to L pt demonstrated eyes slipping to L with L head turns  Head Thrust Test Negative bilat  Head Shaking Nystagmus N/T  Rt. Hallpike Dix N/T  Lt. Hallpike Dix N/T  Rt. Roll  Test N/T  Lt. Roll Test  N/T  Motion sensitivity Pt reports increased sensitivity to rolling L > R, supine > sit, head up from L or R knee, standing turning to R; reports normal vision when rotating to L   VOR cancellation WFL  Attempted use of visual targeting during rolling to L but pt reported equal symptoms with targeting vs. Rolling without visual target.    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  Patient also presents with impaired VOR and motion sensitivity in various planes of movement.    Recommendations: Continue gaze stabilization training in unsupported sitting and in standing with cues to increase velocity of head turns and ROM to L during VOR x 1 viewing horizontal x 30 seconds.  Progress VOR by narrowing BOS, increasing duration of x 1 viewing to 60 seconds, adding in dynamic LE movement in standing (marching) and increasing business of background.  Balance training on [employer] to incorporate visual and proprioceptive feedback.  Incorporate noxious head movements into all functional mobility for habituation.        Therapy Documentation Precautions:  Precautions Precautions: Fall Precaution Comments: leans to left Restrictions Weight Bearing Restrictions: No Vital Signs: Therapy Vitals Pulse Rate: 67 BP: (!) 146/89 mmHg Patient Position (if appropriate): Sitting Pain: Pain  Assessment Pain Assessment: 0-10 Pain Score: 2  Pain Type: Neuropathic pain Pain Location: Head Pain Orientation: Medial;Anterior Pain Descriptors / Indicators: Dull Pain Onset: On-going Pain Intervention(s): Rest Locomotion : Ambulation Ambulation/Gait Assistance: 3: Mod assist;2: Max assist   See FIM for current functional  status  Therapy/Group: Individual Therapy  Raylene Everts [facility] 05/25/2014, 12:34 PM

## 2014-05-25 NOTE — Progress Notes (Signed)
64 y.o.right handed male with history of hyperlipidemia. Patient independent prior to admission living with his wife working for [employer] Admitted 05/20/2014 with dizziness, facial weakness and headache. 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 MRA of the head with occluded left vertebral artery as well as preocclusive stenosis of the right vertebrobasilar junction and possibly the proximal basilar artery. Echocardiogram with ejection fraction 58% grade 1 diastolic dysfunction. 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 Attempted stenting by interventional radiology 05/21/2014 was unsuccessful. There are considerations being made for possible re-attempted stenting in the next few weeks Neurology follow-up currently maintained on aspirin and Plavix for CVA prophylaxis and continue dual antiplatelets 3 months  Subjective/Complaints: Realized that he has difficulty sensing hot/cold on the right. Still with hiccups  Objective: Vital Signs: Blood pressure 147/78, pulse 66, temperature 98.4 F (36.9 C), temperature source Oral, resp. rate 18, height $RemoveBe'5\' 10"'TLJLcmKSP$  (1.778 m), weight 95.074 kg (209 lb 9.6 oz), SpO2 97 %. No results found. Results for orders placed or performed during the hospital encounter of 05/23/14 (from the past 72 hour(s))  CBC WITH DIFFERENTIAL     Status: None   Collection Time: 05/24/14  7:53 AM  Result Value Ref Range   WBC 8.8 4.0 - 10.5 K/uL   RBC 4.82 4.22 - 5.81 MIL/uL   Hemoglobin 14.8 13.0 - 17.0 g/dL   HCT 42.1 39.0 - 52.0 %   MCV 87.3 78.0 - 100.0 fL   MCH 30.7 26.0 - 34.0 pg   MCHC 35.2 30.0 - 36.0 g/dL   RDW 12.9 11.5 - 15.5 %   Platelets 202 150 - 400 K/uL   Neutrophils Relative % 76 43 - 77 %   Neutro Abs 6.7 1.7 - 7.7 K/uL   Lymphocytes Relative 14 12 - 46 %   Lymphs Abs 1.3 0.7 - 4.0 K/uL   Monocytes Relative 8 3 - 12 %   Monocytes Absolute 0.7 0.1 - 1.0 K/uL    Eosinophils Relative 2 0 - 5 %   Eosinophils Absolute 0.2 0.0 - 0.7 K/uL   Basophils Relative 0 0 - 1 %   Basophils Absolute 0.0 0.0 - 0.1 K/uL  Comprehensive metabolic panel     Status: Abnormal   Collection Time: 05/24/14  7:53 AM  Result Value Ref Range   Sodium 139 135 - 145 mmol/L   Potassium 3.6 3.5 - 5.1 mmol/L   Chloride 108 96 - 112 mmol/L   CO2 21 19 - 32 mmol/L   Glucose, Bld 159 (H) 70 - 99 mg/dL   BUN 12 6 - 23 mg/dL   Creatinine, Ser 0.94 0.50 - 1.35 mg/dL   Calcium 9.2 8.4 - 10.5 mg/dL   Total Protein 6.8 6.0 - 8.3 g/dL   Albumin 4.1 3.5 - 5.2 g/dL   AST 25 0 - 37 U/L   ALT 31 0 - 53 U/L   Alkaline Phosphatase 69 39 - 117 U/L   Total Bilirubin 1.5 (H) 0.3 - 1.2 mg/dL   GFR calc non Af Amer 87 (L) >90 mL/min   GFR calc Af Amer >90 >90 mL/min    Comment: (NOTE) The eGFR has been calculated using the CKD EPI equation. This calculation has not been validated in all clinical situations. eGFR's persistently <90 mL/min signify possible Chronic Kidney Disease.    Anion gap 10 5 - 15     HEENT: normal Cardio: RRR and no  murmur Resp: CTA B/L and unlabored GI: BS positive and NT, ND Extremity:  Pulses positive and No Edema Skin:   Intact Neuro: Alert/Oriented, Cranial Nerve II-XII normal, Abnormal Sensory reduced sensation on right side, Abnormal Motor decreased R foot toe flex/ext, Abnormal FMC Ataxic/ dec FMC and Other left lateral pulsion Musc/Skel:  Normal Gen NAD, continued hiccups   Assessment/Plan: 1. 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 Physiatrist is providing close team supervision and 24 hour management of active medical problems listed below. Physiatrist and rehab team continue to assess barriers to discharge/monitor patient progress toward functional and medical goals. FIM: FIM - Bathing Bathing  Steps Patient Completed: Chest, Right Arm, Left upper leg, Right lower leg (including foot), Left Arm, Abdomen, Front perineal area, Buttocks, Right upper leg, Left lower leg (including foot) Bathing: 4: Min-Patient completes 8-9 86f 10 parts or 75+ percent  FIM - Upper Body Dressing/Undressing Upper body dressing/undressing steps patient completed: Thread/unthread right sleeve of pullover shirt/dresss, Thread/unthread left sleeve of pullover shirt/dress, Put head through opening of pull over shirt/dress, Pull shirt over trunk Upper body dressing/undressing: 5: Supervision: Safety issues/verbal cues FIM - Lower Body Dressing/Undressing Lower body dressing/undressing steps patient completed: Thread/unthread right underwear leg, Thread/unthread left underwear leg, Pull underwear up/down, Thread/unthread right pants leg, Thread/unthread left pants leg, Pull pants up/down, Don/Doff right sock, Don/Doff left sock, Don/Doff right shoe, Don/Doff left shoe, Fasten/unfasten right shoe, Fasten/unfasten left shoe Lower body dressing/undressing: 4: Min-Patient completed 75 plus % of tasks        FIM - Bed/Chair Transfer Bed/Chair Transfer: 4: Bed > Chair or W/C: Min A (steadying Pt. > 75%), 4: Chair or W/C > Bed: Min A (steadying Pt. > 75%)  FIM - Locomotion: Wheelchair Locomotion: Wheelchair: 0: Activity did not occur (pt ambulatory) FIM - Locomotion: Ambulation Locomotion: Ambulation Assistive Devices: Other (comment) (L HHA) Ambulation/Gait Assistance: 3: Mod assist, 2: Max assist Locomotion: Ambulation: 2: Travels 150 ft or more with maximal assistance (Pt: 25 - 49%)  Comprehension Comprehension Mode: Auditory Comprehension: 7-Follows complex conversation/direction: With no assist  Expression Expression Mode: Verbal Expression: 7-Expresses complex ideas: With no assist  Social Interaction Social Interaction: 7-Interacts appropriately with others - No medications needed.  Problem  Solving Problem Solving Mode: Not assessed Problem Solving: 7-Solves complex problems: Recognizes & self-corrects  Memory Memory Mode: Not assessed Memory: 7-Complete Independence: No helper    Medical Problem List and Plan: 1. 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 2. DVT Prophylaxis/Anticoagulation: Subcutaneous Lovenox. Monitor platelet counts or any signs of bleeding 3. Pain Management: Tylenol as needed 4. Hyperlipidemia. Lipitor 5. Neuropsych: This patient is capable of making decisions on his own behalf. 6. Skin/Wound Care: Routine skin checks 7. Fluids/Electrolytes/Nutrition: Strict I&O with follow-up chemistries 8. Right vertebral and basilar artery stenosis. Initial attempts at stenting unsuccessful. Plan to discuss reattempts at stenting in the next few weeks with interventional radiology 10. Persistent hiccups---central origin---  -on max dose baclofen--works but only works temporarily  -will introduce low dose gabapentin as well  LOS (Days) 2 A FACE TO FACE EVALUATION WAS PERFORMED  Shivon Hackel T 05/25/2014, 8:05 AM

## 2014-05-25 NOTE — Progress Notes (Signed)
Occupational Therapy Session Note  Patient Details  Name: Alejandro Schneider MRN: [id number] Date of Birth: 08/05/1950  Today's Date: 05/25/2014 OT Individual Time: 1300-1400 OT Individual Time Calculation (min): 60 min    Skilled Therapeutic Interventions/Progress Updates:    Alejandro Schneider began session with ambulation down to the therapy gym.  Min to mod assist needed to maintain balance during mobility.  He exhibits increased LOB to the left and wide staggering gait pattern.  Increased head tilt to the right also noted.  Performed gaze stabilization exercises to begin session.  Noted vertical nystagmus when gaze is fixed superiorly and slight horizontal nystagmus with fixed gaze at midline.  Pt did demonstrate decreased nystagmus if head was turned to the right with gaze at midline or slightly left of midline.  He reported decreased nystagmus as well with inferior gaze.  Progressed to working on dynamic standing balance and balance reactions with use of the Biodex.  Performed Postural Stability Program with emphasis on weightshifts to the left.  Transitioned to working on standing while stepping up on a block with the LLE.  Pt needing mod assist to perform this without UE support, however he could complete with min assist if given target on a wall to hold his right hand on.  He was able to then progress to use of a walking stick in the right hand for ambulation with min guard assist for short distances.  Once the stick was removed pt's midline balance had improved however he would still demonstrate wide BOS and occasional LOB to the left.    Therapy Documentation Precautions:  Precautions Precautions: Fall Precaution Comments: leans to left Restrictions Weight Bearing Restrictions: No  Pain: Pain Assessment Pain Assessment: No/denies pain Pain Score: 2  Pain Type: Neuropathic pain Pain Location: Head Pain Orientation: Medial;Anterior Pain Descriptors / Indicators: Dull Pain Onset:  On-going Pain Intervention(s): Rest ADL: See FIM for current functional status  Therapy/Group: Individual Therapy  Vianka Ertel OTR/L 05/25/2014, 3:09 PM

## 2014-05-26 ENCOUNTER — Ambulatory Visit (HOSPITAL_COMMUNITY): Payer: 59 | Admitting: Physical Therapy

## 2014-05-26 ENCOUNTER — Inpatient Hospital Stay (HOSPITAL_COMMUNITY): Payer: 59 | Admitting: Physical Therapy

## 2014-05-26 ENCOUNTER — Encounter (HOSPITAL_COMMUNITY): Payer: 59 | Admitting: Occupational Therapy

## 2014-05-26 DIAGNOSIS — I6931 Cognitive deficits following cerebral infarction: Secondary | ICD-10-CM

## 2014-05-26 DIAGNOSIS — R066 Hiccough: Secondary | ICD-10-CM

## 2014-05-26 MED ORDER — METOCLOPRAMIDE HCL 5 MG PO TABS
5.0000 mg | ORAL_TABLET | Freq: Three times a day (TID) | ORAL | Status: DC
  Administered 2014-05-26 – 2014-05-30 (×13): 5 mg via ORAL
  Filled 2014-05-26 (×17): qty 1

## 2014-05-26 NOTE — Progress Notes (Signed)
64 y.o.right handed male with history of hyperlipidemia. Patient independent prior to admission living with his wife working for [employer]. Admitted 05/20/2014 with dizziness, facial weakness and headache. 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 MRA of the head with occluded left vertebral artery as well as preocclusive stenosis of the right vertebrobasilar junction and possibly the proximal basilar artery. Echocardiogram with ejection fraction 16% grade 1 diastolic dysfunction. 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 Attempted stenting by interventional radiology 05/21/2014 was unsuccessful. There are considerations being made for possible re-attempted stenting in the next few weeks Neurology follow-up currently maintained on aspirin and Plavix for CVA prophylaxis and continue dual antiplatelets 3 months  Subjective/Complaints: Still with hiccups. Gabapentin caused some confusions, had nightmares. Vomited this morning after breakfast  Objective: Vital Signs: Blood pressure 130/80, pulse 58, temperature 98.3 F (36.8 C), temperature source Oral, resp. rate 18, height '5\' 10"'  (1.778 m), weight 95.074 kg (209 lb 9.6 oz), SpO2 99 %. No results found. Results for orders placed or performed during the hospital encounter of 05/23/14 (from the past 72 hour(s))  CBC WITH DIFFERENTIAL     Status: None   Collection Time: 05/24/14  7:53 AM  Result Value Ref Range   WBC 8.8 4.0 - 10.5 K/uL   RBC 4.82 4.22 - 5.81 MIL/uL   Hemoglobin 14.8 13.0 - 17.0 g/dL   HCT 42.1 39.0 - 52.0 %   MCV 87.3 78.0 - 100.0 fL   MCH 30.7 26.0 - 34.0 pg   MCHC 35.2 30.0 - 36.0 g/dL   RDW 12.9 11.5 - 15.5 %   Platelets 202 150 - 400 K/uL   Neutrophils Relative % 76 43 - 77 %   Neutro Abs 6.7 1.7 - 7.7 K/uL   Lymphocytes Relative 14 12 - 46 %   Lymphs Abs 1.3 0.7 - 4.0 K/uL   Monocytes Relative 8 3 - 12 %   Monocytes Absolute  0.7 0.1 - 1.0 K/uL   Eosinophils Relative 2 0 - 5 %   Eosinophils Absolute 0.2 0.0 - 0.7 K/uL   Basophils Relative 0 0 - 1 %   Basophils Absolute 0.0 0.0 - 0.1 K/uL  Comprehensive metabolic panel     Status: Abnormal   Collection Time: 05/24/14  7:53 AM  Result Value Ref Range   Sodium 139 135 - 145 mmol/L   Potassium 3.6 3.5 - 5.1 mmol/L   Chloride 108 96 - 112 mmol/L   CO2 21 19 - 32 mmol/L   Glucose, Bld 159 (H) 70 - 99 mg/dL   BUN 12 6 - 23 mg/dL   Creatinine, Ser 0.94 0.50 - 1.35 mg/dL   Calcium 9.2 8.4 - 10.5 mg/dL   Total Protein 6.8 6.0 - 8.3 g/dL   Albumin 4.1 3.5 - 5.2 g/dL   AST 25 0 - 37 U/L   ALT 31 0 - 53 U/L   Alkaline Phosphatase 69 39 - 117 U/L   Total Bilirubin 1.5 (H) 0.3 - 1.2 mg/dL   GFR calc non Af Amer 87 (L) >90 mL/min   GFR calc Af Amer >90 >90 mL/min    Comment: (NOTE) The eGFR has been calculated using the CKD EPI equation. This calculation has not been validated in all clinical situations. eGFR's persistently <90 mL/min signify possible Chronic Kidney Disease.    Anion gap 10 5 - 15     HEENT: normal Cardio: RRR and  no murmur Resp: CTA B/L and unlabored GI: BS positive and NT, ND Extremity:  Pulses positive and No Edema Skin:   Intact Neuro: Alert/Oriented, Cranial Nerve II-XII normal, Abnormal Sensory reduced sensation on right side, Abnormal Motor decreased R foot toe flex/ext, Abnormal FMC Ataxic/ dec FMC and Other left lateral pulsion Musc/Skel:  Normal Gen NAD, continued hiccups   Assessment/Plan: 1. 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 Physiatrist is providing close team supervision and 24 hour management of active medical problems listed below. Physiatrist and rehab team continue to assess barriers to discharge/monitor patient progress toward functional and medical goals. FIM: FIM  - Bathing Bathing Steps Patient Completed: Chest, Right Arm, Left upper leg, Right lower leg (including foot), Left Arm, Abdomen, Front perineal area, Buttocks, Right upper leg, Left lower leg (including foot) Bathing: 4: Steadying assist  FIM - Upper Body Dressing/Undressing Upper body dressing/undressing steps patient completed: Thread/unthread right sleeve of pullover shirt/dresss, Thread/unthread left sleeve of pullover shirt/dress, Put head through opening of pull over shirt/dress, Pull shirt over trunk Upper body dressing/undressing: 5: Supervision: Safety issues/verbal cues FIM - Lower Body Dressing/Undressing Lower body dressing/undressing steps patient completed: Thread/unthread right underwear leg, Thread/unthread left underwear leg, Pull underwear up/down, Thread/unthread right pants leg, Thread/unthread left pants leg, Pull pants up/down, Don/Doff right sock, Don/Doff left sock, Don/Doff right shoe, Don/Doff left shoe, Fasten/unfasten right shoe, Fasten/unfasten left shoe Lower body dressing/undressing: 5: Supervision: Safety issues/verbal cues  FIM - Toileting Toileting steps completed by patient: Adjust clothing prior to toileting, Performs perineal hygiene, Adjust clothing after toileting Toileting Assistive Devices: Grab bar or rail for support Toileting: 5: Supervision: Safety issues/verbal cues  FIM - Radio producer Devices: Product manager Transfers: 5-To toilet/BSC: Supervision (verbal cues/safety issues), 5-From toilet/BSC: Supervision (verbal cues/safety issues)  FIM - IT sales professional Transfer: 5: Supine > Sit: Supervision (verbal cues/safety issues), 5: Sit > Supine: Supervision (verbal cues/safety issues), 4: Bed > Chair or W/C: Min A (steadying Pt. > 75%), 4: Chair or W/C > Bed: Min A (steadying Pt. > 75%)  FIM - Locomotion: Wheelchair Locomotion: Wheelchair: 0: Activity did not occur FIM - Locomotion:  Ambulation Locomotion: Ambulation Assistive Devices: Other (comment) (L HHA) Ambulation/Gait Assistance: 3: Mod assist, 2: Max assist Locomotion: Ambulation: 2: Travels 150 ft or more with maximal assistance (Pt: 25 - 49%)  Comprehension Comprehension Mode: Auditory Comprehension: 7-Follows complex conversation/direction: With no assist  Expression Expression Mode: Verbal Expression: 7-Expresses complex ideas: With no assist  Social Interaction Social Interaction: 7-Interacts appropriately with others - No medications needed.  Problem Solving Problem Solving Mode: Not assessed Problem Solving: 7-Solves complex problems: Recognizes & self-corrects  Memory Memory Mode: Not assessed Memory: 7-Complete Independence: No helper    Medical Problem List and Plan: 1. 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 2. DVT Prophylaxis/Anticoagulation: Subcutaneous Lovenox. Monitor platelet counts or any signs of bleeding 3. Pain Management: Tylenol as needed 4. Hyperlipidemia. Lipitor 5. Neuropsych: This patient is capable of making decisions on his own behalf. 6. Skin/Wound Care: Routine skin checks 7. Fluids/Electrolytes/Nutrition: Strict I&O with follow-up chemistries 8. Right vertebral and basilar artery stenosis. Initial attempts at stenting unsuccessful. Plan to discuss reattempts at stenting in the next few weeks with interventional radiology 10. Persistent hiccups---central origin---  -on max dose baclofen--works but only works temporarily  -gabapentin not  tolerated even at low dose---stopped. Will try reglan 52m tid ac  LOS (Days) 3 A FACE TO FACE EVALUATION WAS PERFORMED  SWARTZ,ZACHARY T 05/26/2014, 8:31 AM

## 2014-05-26 NOTE — Progress Notes (Signed)
Physical Therapy Session Note  Patient Details  Name: Alejandro Schneider MRN: [id number] Date of Birth: 06/28/1950  Today's Date: 05/26/2014 PT Individual Time: 1305-1405 PT Individual Time Calculation (min): 60 min   Short Term Goals: Week 1:  PT Short Term Goal 1 (Week 1): = LTGs due to anticipated short LOS  Skilled Therapeutic Interventions/Progress Updates:   Pt received sitting in recliner, wife present for session. Gait training x 150 ft with close supervision, pt demonstrating head rotated/laterally flexed to R, wide BOS, and short step length with verbal/visual cues for increased B step length and midline orientation. Pt performed weight shifting to right during functional task of reaching for objects using RUE on left side and placing/removing on high basketball rim outside BOS to right with close supervision, one LOB when removing object and placing to target outside BOS in front of patient. Patient reports improvement in visual symptoms (oscillopsia) with head turn to R and L. Pt performed static standing balance on foam with mirror in front for feedback for midline orientation with multidirectional balance perturbations with supervision-mod A. Gait training using floor ladder to facilitate increased B step length, multiple trials with min A progressed to min guard. Gait training in controlled environment with horizontal/vertical head turns, turns, and stopping with supervision as well as negotiating up/down ramp and curb step x 2 with min guard. In ADL apartment, pt made the bed to challenge dynamic standing balance and gait in home environment with close supervision and cues for safety. NuStep using BUE/BLE x 12 min at level 7 for neuro re-ed and activity tolerance, with cues for midline head positioning. Pt ambulated back to room with close supervision and LOB to left x 2 requiring min guard and left sitting in recliner with wife present, needs within reach.      Therapy  Documentation Precautions:  Precautions Precautions: Fall Precaution Comments: leans to left Restrictions Weight Bearing Restrictions: No Pain: Pain Assessment Pain Assessment: No/denies pain  See FIM for current functional status  Therapy/Group: Individual Therapy  Laretta Alstrom 05/26/2014, 2:15 PM

## 2014-05-26 NOTE — Progress Notes (Signed)
Occupational Therapy Session Note  Patient Details  Name: Alejandro Schneider MRN: [id number] Date of Birth: July 06, 1950  Today's Date: 05/26/2014 OT Individual Time: 1100-1200 OT Individual Time Calculation (min): 60 min    Short Term Goals: Week 1:  OT Short Term Goal 1 (Week 1): STGs equal to LTGs set at modified independent level  Skilled Therapeutic Interventions/Progress Updates:    Pt ambulated to the gym with min assist overall to begin session.  Wide BOS with short step length noted on the left secondary to decreased weightshifting to the right.  He still tends to try and compensate by leaning his trunk and his head to the right.  Once in the gym worked on weightshifts and reaching to the right to increase dynamic standing.  Placed clothes pins in container on the floor and had pt reach down and then stand back up and shift laterally to the right for placement on vertical pole.  Pt able to complete with supervision.  Progressed to standing on foam square to complete task as well with pt needing occasional min assist secondary to LOB to the left, and on one occasion backwards.  Utilized Biodex for weightshifting as well with use of Postural Stability program.  Increased difficulty with maintaining weightshift to the left without rotation of his trunk and right lean.  He was able to tolerate 10 mins of use.  Progressed to functional mobility with use of built of shoe lift on the left side.  Pt needing occasional min assist for balance with this task but demonstrated periods of min guard assist.  Once shoe lift was removed pt with increased weight shift and balance to the left side with less compensation in the trunk and head.  After ambulating a few feet (less than 30), he again went back into a compensation pattern with LOB and lean to the left.  Pt still reporting constant nystagmus in vision.  Only resolves for 1-2 seconds when he changes his gaze from one object to the other, then it will  continue.   Therapy Documentation Precautions:  Precautions Precautions: Fall Precaution Comments: leans to left Restrictions Weight Bearing Restrictions: No  Pain: Pain Assessment Pain Assessment: No/denies pain Pain Score:  (reports better) Pain Type: Acute pain Pain Location: Ear Pain Orientation: Left Pain Descriptors / Indicators: Sore Pain Onset: Gradual Pain Intervention(s): Medication (See eMAR) ADL: See FIM for current functional status  Therapy/Group: Individual Therapy  Tobechukwu Emmick OTR/L 05/26/2014, 12:36 PM

## 2014-05-27 ENCOUNTER — Encounter (HOSPITAL_COMMUNITY): Payer: 59

## 2014-05-27 ENCOUNTER — Inpatient Hospital Stay (HOSPITAL_COMMUNITY): Payer: 59 | Admitting: Physical Therapy

## 2014-05-27 DIAGNOSIS — G500 Trigeminal neuralgia: Secondary | ICD-10-CM

## 2014-05-27 DIAGNOSIS — I638 Other cerebral infarction: Secondary | ICD-10-CM

## 2014-05-27 MED ORDER — POLYETHYLENE GLYCOL 3350 17 G PO PACK
17.0000 g | PACK | Freq: Every day | ORAL | Status: DC
  Administered 2014-05-27 – 2014-05-30 (×4): 17 g via ORAL
  Filled 2014-05-27 (×6): qty 1

## 2014-05-27 NOTE — Plan of Care (Signed)
Problem: RH BOWEL ELIMINATION Goal: RH STG MANAGE BOWEL WITH ASSISTANCE STG Manage Bowel with Assistance. Mod I  Outcome: Progressing No incontinent episode

## 2014-05-27 NOTE — Progress Notes (Signed)
Subjective/Complaints: Still with hiccups. Wife feels that overall better during the day.  Had episode of left hemifacial pain.    Review of Systems - Negative except occ small emesis, nausea, no abd pain  Objective: Vital Signs: Blood pressure 130/7, pulse 60, temperature 98.5 F (36.9 C), temperature source Oral, resp. rate 18, height 5\' 10"  (1.778 m), weight 95.074 kg (209 lb 9.6 oz), SpO2 98 %. No results found. Results for orders placed or performed during the hospital encounter of 05/23/14 (from the past 72 hour(s))  CBC WITH DIFFERENTIAL     Status: None   Collection Time: 05/24/14  7:53 AM  Result Value Ref Range   WBC 8.8 4.0 - 10.5 K/uL   RBC 4.82 4.22 - 5.81 MIL/uL   Hemoglobin 14.8 13.0 - 17.0 g/dL   HCT 05/26/14 07.8 - 91.7 %   MCV 87.3 78.0 - 100.0 fL   MCH 30.7 26.0 - 34.0 pg   MCHC 35.2 30.0 - 36.0 g/dL   RDW 63.9 39.8 - 93.4 %   Platelets 202 150 - 400 K/uL   Neutrophils Relative % 76 43 - 77 %   Neutro Abs 6.7 1.7 - 7.7 K/uL   Lymphocytes Relative 14 12 - 46 %   Lymphs Abs 1.3 0.7 - 4.0 K/uL   Monocytes Relative 8 3 - 12 %   Monocytes Absolute 0.7 0.1 - 1.0 K/uL   Eosinophils Relative 2 0 - 5 %   Eosinophils Absolute 0.2 0.0 - 0.7 K/uL   Basophils Relative 0 0 - 1 %   Basophils Absolute 0.0 0.0 - 0.1 K/uL  Comprehensive metabolic panel     Status: Abnormal   Collection Time: 05/24/14  7:53 AM  Result Value Ref Range   Sodium 139 135 - 145 mmol/L   Potassium 3.6 3.5 - 5.1 mmol/L   Chloride 108 96 - 112 mmol/L   CO2 21 19 - 32 mmol/L   Glucose, Bld 159 (H) 70 - 99 mg/dL   BUN 12 6 - 23 mg/dL   Creatinine, Ser 05/26/14 0.50 - 1.35 mg/dL   Calcium 9.2 8.4 - 2.18 mg/dL   Total Protein 6.8 6.0 - 8.3 g/dL   Albumin 4.1 3.5 - 5.2 g/dL   AST 25 0 - 37 U/L   ALT 31 0 - 53 U/L   Alkaline Phosphatase 69 39 - 117 U/L   Total Bilirubin 1.5 (H) 0.3 - 1.2 mg/dL   GFR calc non Af Amer 87 (L) >90 mL/min   GFR calc Af Amer >90 >90 mL/min    Comment: (NOTE) The eGFR has  been calculated using the CKD EPI equation. This calculation has not been validated in all clinical situations. eGFR's persistently <90 mL/min signify possible Chronic Kidney Disease.    Anion gap 10 5 - 15     HEENT: normal Cardio: RRR and no murmur Resp: CTA B/L and unlabored GI: BS positive and NT, ND Extremity:  Pulses positive and No Edema Skin:   Intact Neuro: Alert/Oriented, Cranial Nerve II-XII normal, Abnormal Sensory reduced sensation on right side, Abnormal Motor decreased R foot toe flex/ext, Abnormal FMC Ataxic/ dec FMC and Other left lateral pulsion Musc/Skel:  Normal Gen NAD, continued hiccups   Assessment/Plan: 1. 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 Physiatrist is providing close team supervision and 24 hour management of active medical problems listed below. Physiatrist  and rehab team continue to assess barriers to discharge/monitor patient progress toward functional and medical goals. FIM: FIM - Bathing Bathing Steps Patient Completed: Chest, Right Arm, Left upper leg, Right lower leg (including foot), Left Arm, Abdomen, Front perineal area, Buttocks, Right upper leg, Left lower leg (including foot) Bathing: 4: Steadying assist  FIM - Upper Body Dressing/Undressing Upper body dressing/undressing steps patient completed: Thread/unthread right sleeve of pullover shirt/dresss, Thread/unthread left sleeve of pullover shirt/dress, Put head through opening of pull over shirt/dress, Pull shirt over trunk Upper body dressing/undressing: 5: Supervision: Safety issues/verbal cues FIM - Lower Body Dressing/Undressing Lower body dressing/undressing steps patient completed: Thread/unthread right underwear leg, Thread/unthread left underwear leg, Pull underwear up/down, Thread/unthread right pants leg, Thread/unthread left  pants leg, Pull pants up/down, Don/Doff right sock, Don/Doff left sock, Don/Doff right shoe, Don/Doff left shoe, Fasten/unfasten right shoe, Fasten/unfasten left shoe Lower body dressing/undressing: 5: Supervision: Safety issues/verbal cues  FIM - Toileting Toileting steps completed by patient: Adjust clothing after toileting, Performs perineal hygiene, Adjust clothing prior to toileting Toileting Assistive Devices: Grab bar or rail for support Toileting: 5: Supervision: Safety issues/verbal cues  FIM - Radio producer Devices: Product manager Transfers: 5-To toilet/BSC: Supervision (verbal cues/safety issues), 5-From toilet/BSC: Supervision (verbal cues/safety issues)  FIM - IT sales professional Transfer: 5: Bed > Chair or W/C: Supervision (verbal cues/safety issues), 5: Chair or W/C > Bed: Supervision (verbal cues/safety issues)  FIM - Locomotion: Wheelchair Locomotion: Wheelchair: 0: Activity did not occur FIM - Locomotion: Ambulation Locomotion: Ambulation Assistive Devices: Other (comment) (none) Ambulation/Gait Assistance: 5: Supervision, 4: Min guard Locomotion: Ambulation: 4: Travels 150 ft or more with minimal assistance (Pt.>75%)  Comprehension Comprehension Mode: Auditory Comprehension: 7-Follows complex conversation/direction: With no assist  Expression Expression Mode: Verbal Expression: 6-Expresses complex ideas: With extra time/assistive device  Social Interaction Social Interaction: 6-Interacts appropriately with others with medication or extra time (anti-anxiety, antidepressant).  Problem Solving Problem Solving Mode: Not assessed Problem Solving: 5-Solves complex 90% of the time/cues < 10% of the time  Memory Memory Mode: Not assessed Memory: 7-Complete Independence: No helper    Medical Problem List and Plan: 1. 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 2. DVT Prophylaxis/Anticoagulation: Subcutaneous Lovenox. Monitor platelet counts or any signs of bleeding 3. Pain Management: Tylenol as needed, hemifacial pain trigeminal distribution 4. Hyperlipidemia. Lipitor 5. Neuropsych: This patient is capable of making decisions on his own behalf. 6. Skin/Wound Care: Routine skin checks 7. Fluids/Electrolytes/Nutrition: Strict I&O with follow-up chemistries 8. Right vertebral and basilar artery stenosis. Initial attempts at stenting unsuccessful. Plan to discuss reattempts at stenting in the next few weeks with interventional radiology 10. Persistent hiccups---central origin---  -on max dose baclofen--works per wife pt is sleeping better  -gabapentin not tolerated even at low dose---stopped.  try reglan $RemoveBe'5mg'[id number]$  tid ac  LOS (Days) 4 A FACE TO FACE EVALUATION WAS PERFORMED  KIRSTEINS,ANDREW E 05/27/2014, 7:03 AM

## 2014-05-27 NOTE — Progress Notes (Signed)
Social Work Patient ID: Alejandro Schneider, male   DOB: [birth year], 64 y.o.   MRN: [id number] Information faxed to pt's [employer] for his claim to begin.  Pt and wife aware it has been started.

## 2014-05-27 NOTE — Plan of Care (Signed)
Problem: RH BLADDER ELIMINATION Goal: RH STG MANAGE BLADDER WITH ASSISTANCE STG Manage Bladder With Assistance. Mod I  Outcome: Progressing No incontinent episode reported

## 2014-05-27 NOTE — Progress Notes (Signed)
Occupational Therapy Session Note  Patient Details  Name: Alejandro Schneider MRN: [id number] Date of Birth: 14-Feb-1951  Today's Date: 05/27/2014 OT Individual Time: 1300-1400 OT Individual Time Calculation (min): 60 min    Short Term Goals: Week 1:  OT Short Term Goal 1 (Week 1): STGs equal to LTGs set at modified independent level  Skilled Therapeutic Interventions/Progress Updates:    Pt seen for ADL retraining with focus on standing balance, safety awareness, and functional mobility. Pt received sitting in w/c. Ambulated to bathroom without AD at min A overall. Completed bathing sit<>stand level in shower at supervision level with use of grab bars to assist with balance. Completed dressing with setup assist to retrieve clothing and min cues for safety to completed LB while sitting. Ambulated to ADL apartment to practice simulated meal prep activity reaching into lower cabinets at supervision level with no LOB noted. Ambulated to therapy gym to engage in higher level balance activities with emphasis on head rotation. Began while standing on floor and tossing ball L<>R with focus on head turn then progressed to standing on foam cushion to challenge balance. Pt required min-SBA overall during task. Practiced ambulating about room to retrieve items from floor at min-CGA for balance and emphasis on squating vs bending at hips. Pt with slight LOB left during turn changes, requiring min A to correct. Engaged in high level balance tasks of side stepping and walking backwards with min A for balance. At end of session pt ambulated back to room and left sitting in w/c with all needs in reach.   Therapy Documentation Precautions:  Precautions Precautions: Fall Precaution Comments: leans to left Restrictions Weight Bearing Restrictions: No General:   Vital Signs:  Pain: No report of pain  See FIM for current functional status  Therapy/Group: Individual Therapy  Duayne Cal 05/27/2014,  2:05 PM

## 2014-05-27 NOTE — Plan of Care (Signed)
Problem: RH PAIN MANAGEMENT Goal: RH STG PAIN MANAGED AT OR BELOW PT'S PAIN GOAL <3 Outcome: Progressing No c/o pain     

## 2014-05-27 NOTE — Progress Notes (Signed)
Physical Therapy Session Note  Patient Details  Name: Alejandro Schneider MRN: [id number] Date of Birth: 03-Mar-1951  Today's Date: 05/27/2014 PT Individual Time: 830-930 and 1100-1200  PT Individual Time Calculation (min): 60 min and 60 min  Short Term Goals: Week 1:  PT Short Term Goal 1 (Week 1): = LTGs due to anticipated short LOS  Skilled Therapeutic Interventions/Progress Updates:   Session 1: Pt received in wheelchair, donned ted hose and shoes with mod I. Session focused on vestibular rehab and midline orientation. Pt reporting increase in symptoms since yesterday's session, rated at 6/10 (increased from 5/10) that continues to improve briefly with horizontal head turns. Pt ambulated to/from therapy gym without device and close supervision, verbal cues for reciprocal arm swing and increased L step length/smoothness of gait. In gym, patient participated in catching basketball rolled on floor to left and right at increased pace to facilitate head pitches as well as overhead toss to therapist with progressive narrowing of BOS requiring supervision faded to min tactile cues with feet together. Pt performed single leg stance on RLE with visual/tactile cue of reaching RUE to target, able to lift LLE < 3 sec before losing balance to L requiring min-mod A. VOR in standing with wide BOS > narrow BOS > tandem stance leading with RLE then leading with LLE for 60 sec trials with increased difficulty during tandem stance due to pt turning trunk with head. Pt also required verbal cues for midline head position and full rotation to left. Patient passed ball in multiple directions to facilitate head turns during ambulation with therapist positioned behind patient or on each side with +2 assist with supervision. Pt performed gaze stabilization exercises due to report of oscillopsia with noted improvement in symptoms becoming "still" with objects located closer than arm-length distance; however, exercise limited by  patient's vision as closer objects were blurry without use of reading glasses. Pt returned to room and left sitting in wheelchair with needs within reach.   Session 2: Session focused on outdoor/community ambulation, functional transfers, and NMR. Pt ambulated without device throughout rehab unit, on/off elevators, in mildly crowded hospital lobby, navigated narrow spaces in carpeted gift shop, across thresholds, and outdoors on brick and concrete surfaces, inclines/declines, and up/down 4 brick steps using 1 rail with overall supervision, 2 x > 1000 ft. Pt with one LOB to left when turning to sit in arm chair for seated rest. Pt demonstrated wide BOS, decreased L step length, and decreased weight shift to R with ambulation, especially in crowded gift shop as patient was attempting to avoid obstacles. Pt performed simulated car transfer to sedan height x 1 and truck height x 1 with distant supervision. Upon returning to therapy gym, patient performed floor transfer with mod I and use of mat table for UE support. Pt performed tall kneeling <> half kneeling leading with alternating LE x 5 each side. In half kneeling with mirror initially for visual feedback, patient worked on balance and midline orientation with multimodal cues for head positioning and weight shift to R. Progressed to ball toss in all directions while patient in half kneeling position with no LOB. Pt returned to room ambulating while tossing ball to self with close supervision and left sitting in wheelchair with call bell within reach.   Therapy Documentation Precautions:  Precautions Precautions: Fall Precaution Comments: leans to left Restrictions Weight Bearing Restrictions: No Pain:  Denies pain Locomotion : Ambulation Ambulation/Gait Assistance: 5: Supervision   See FIM for current functional status  Therapy/Group: Individual Therapy  Laretta Alstrom 05/27/2014, 12:28 PM

## 2014-05-28 ENCOUNTER — Inpatient Hospital Stay (HOSPITAL_COMMUNITY): Payer: 59 | Admitting: Rehabilitation

## 2014-05-28 ENCOUNTER — Inpatient Hospital Stay (HOSPITAL_COMMUNITY): Payer: 59 | Admitting: Physical Therapy

## 2014-05-28 ENCOUNTER — Encounter (HOSPITAL_COMMUNITY): Payer: 59

## 2014-05-28 NOTE — Progress Notes (Signed)
Subjective/Complaints:  hiccups improving.Still dizzy with standing or position changes   Had episode of left hemifacial pain and LLE shooting pain x 1 yest  Review of Systems - Negative except occ small emesis, nausea, no abd pain  Objective: Vital Signs: Blood pressure 141/75, pulse 61, temperature 97.3 F (36.3 C), temperature source Oral, resp. rate 18, height 5\' 10"  (1.778 m), weight 95.074 kg (209 lb 9.6 oz), SpO2 97 %. No results found. No results found for this or any previous visit (from the past 72 hour(s)).   HEENT: normal Cardio: RRR and no murmur Resp: CTA B/L and unlabored GI: BS positive and NT, ND Extremity:  Pulses positive and No Edema Skin:   Intact Neuro: Alert/Oriented, Cranial Nerve II-XII normal, Abnormal Sensory reduced sensation on right side, Abnormal Motor decreased R foot toe flex/ext, Abnormal FMC Ataxic/ dec FMC and Other left lateral pulsion Musc/Skel:  Normal Gen NAD, continued hiccups   Assessment/Plan: 1. 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 Physiatrist is providing close team supervision and 24 hour management of active medical problems listed below. Physiatrist and rehab team continue to assess barriers to discharge/monitor patient progress toward functional and medical goals. Team conf in am FIM: FIM - Bathing Bathing Steps Patient Completed: Chest, Right Arm, Left upper leg, Right lower leg (including foot), Left Arm, Abdomen, Front perineal area, Buttocks, Right upper leg, Left lower leg (including foot) Bathing: 4: Steadying assist  FIM - Upper Body Dressing/Undressing Upper body dressing/undressing steps patient completed: Thread/unthread right sleeve of pullover shirt/dresss, Thread/unthread left sleeve of pullover shirt/dress, Put head through opening of pull over shirt/dress, Pull  shirt over trunk Upper body dressing/undressing: 5: Supervision: Safety issues/verbal cues FIM - Lower Body Dressing/Undressing Lower body dressing/undressing steps patient completed: Thread/unthread right underwear leg, Thread/unthread left underwear leg, Pull underwear up/down, Thread/unthread right pants leg, Thread/unthread left pants leg, Pull pants up/down, Don/Doff right sock, Don/Doff left sock, Don/Doff right shoe, Don/Doff left shoe, Fasten/unfasten right shoe, Fasten/unfasten left shoe Lower body dressing/undressing: 5: Set-up assist to: Obtain clothing  FIM - Toileting Toileting steps completed by patient: Adjust clothing after toileting, Performs perineal hygiene, Adjust clothing prior to toileting Toileting Assistive Devices: Grab bar or rail for support Toileting: 5: Supervision: Safety issues/verbal cues  FIM - Radio producer Devices: Grab bars Toilet Transfers: 4-To toilet/BSC: Min A (steadying Pt. > 75%)  FIM - Bed/Chair Transfer Bed/Chair Transfer: 4: Supine > Sit: Min A (steadying Pt. > 75%/lift 1 leg)  FIM - Locomotion: Wheelchair Locomotion: Wheelchair: 0: Activity did not occur FIM - Locomotion: Ambulation Locomotion: Ambulation Assistive Devices: Other (comment) (none) Ambulation/Gait Assistance: 5: Supervision Locomotion: Ambulation: 5: Travels 150 ft or more with supervision/safety issues  Comprehension Comprehension Mode: Auditory Comprehension: 7-Follows complex conversation/direction: With no assist  Expression Expression Mode: Verbal Expression: 7-Expresses complex ideas: With no assist  Social Interaction Social Interaction: 7-Interacts appropriately with others - No medications needed.  Problem Solving Problem Solving Mode: Not assessed Problem Solving: 6-Solves complex problems: With extra time  Memory Memory Mode: Not assessed Memory: 7-Complete Independence: No helper    Medical Problem List and Plan: 1.  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 2. DVT Prophylaxis/Anticoagulation: Subcutaneous Lovenox. Monitor platelet counts or any signs of bleeding 3. Pain Management: Tylenol as needed, hemifacial pain trigeminal distribution 4. Hyperlipidemia. Lipitor 5.  Neuropsych: This patient is capable of making decisions on his own behalf. 6. Skin/Wound Care: Routine skin checks 7. Fluids/Electrolytes/Nutrition: Strict I&O with follow-up chemistries 8. Right vertebral and basilar artery stenosis. Initial attempts at stenting unsuccessful. Plan to discuss reattempts at stenting in the next few weeks with interventional radiology 10. Persistent hiccups---central origin---  -on max dose baclofen--works per wife pt is sleeping better  -cont reglan 5mg  tid ac, had some reflux with hiccups LOS (Days) 5 A FACE TO FACE EVALUATION WAS PERFORMED  Huldah Marin E 05/28/2014, 7:26 AM

## 2014-05-28 NOTE — Progress Notes (Signed)
Physical Therapy Session Note  Patient Details  Name: Alejandro Schneider MRN: [id number] Date of Birth: 03-09-1951  Today's Date: 05/28/2014 PT Individual Time: 1500-1600 PT Individual Time Calculation (min): 60 min   Short Term Goals: Week 1:  PT Short Term Goal 1 (Week 1): = LTGs due to anticipated short LOS  Skilled Therapeutic Interventions/Progress Updates:   Pt received sitting in recliner in room, agreeable to therapy session.  Son present to observe and participate.  Pt requesting to use restroom prior to leaving room.  Ambulated to/from restroom at S level with min cues for increased L step length and head alignment.  Pt able to stand at mod I level in restroom with UE support to void and manage clothing.  Pt ambulated to/from therapy gym at S level with same cues as mentioned above.  Also provided cues for increased weight shift to the R.  Skilled session focused on high level balance, NMR with weight shifting R and WB.  First attempted gait with vision shielded to determine if decreased visual feedback would lessen symptoms.  He did feel that this helped symptomatically, however discussed difficult to put into function.  Progressed to performing tasks on rocker board forwards/backwards and side/side with ball toss from son.  Performed at S level vertically, however requires min A at times when performing horizontally due to LOB to the L, however did better as he continued with task.  Then worked on balance on compliant mat surface while performing cone taps>knocking cones over>tipping back over with alternating LEs.  Again, requires intermittent min A due to LOB to the L.  Standing>half kneeling>tall kneeling alternating LEs in order to work on weight shifting, esp to the R.  Requires min A for task with cues for slower and controlled motions.  Performed ball toss in hallway with gait forwards and backwards to varying directions at S level.  Performed yoga block kicking x 150 at S level, again  for weight shifting R and modified SLS.  Ended session with braiding task at min A level.  Ambulated back to room and left in recliner with all needs in reach.   Therapy Documentation Precautions:  Precautions Precautions: Fall Precaution Comments: leans to left Restrictions Weight Bearing Restrictions: No  Pain: Pain Assessment Pain Assessment: No/denies pain   Locomotion : Ambulation Ambulation/Gait Assistance: 5: Supervision   See FIM for current functional status  Therapy/Group: Individual Therapy  Denice Bors 05/28/2014, 1:13 PM

## 2014-05-28 NOTE — Plan of Care (Signed)
Problem: RH PAIN MANAGEMENT Goal: RH STG PAIN MANAGED AT OR BELOW PT'S PAIN GOAL <3  Outcome: Progressing No report of pain

## 2014-05-28 NOTE — Progress Notes (Signed)
Occupational Therapy Session Note  Patient Details  Name: Alejandro Schneider MRN: [id number] Date of Birth: [birth year]  Today's Date: 05/28/2014 OT Individual Time: 1300-1400 OT Individual Time Calculation (min): 60 min    Short Term Goals: Week 1:  OT Short Term Goal 1 (Week 1): STGs equal to LTGs set at modified independent level  Skilled Therapeutic Interventions/Progress Updates: ADL-retraining (30 min) with focus on improved dynamic sitting/standing balance, safety awareness, effective use of DME (bench, grab bars).   Pt completed functional mobility w/o AD but required min guard assist to gather clothing and ambulate to bathroom d/t mild unsteadiness.   Pt performed bathing unassisted and dressed in bathroom with SBA for safety.     Pt educated on incorporation of head turns to improve scanning.   Therapeutic activity (30 min) with focus on dynamic balance assessment and training using Nintendo Wii with balance board.   Pt reports owing a Nintendo Wii but was unaware of fitness routine to include balance training.   OT provided initial instruction and pt completed assessment and training routine revealing weight distribution of balance as approx 60% through LLE and 40% through RLE.   Pt performed ball drop activity two times with failure at level 5 under profile of "Scooter."   Pt completed session with good effort and declared plan to pursue use of board s/p discharge.   Pt advised on fall risk and safety concerns relating to driving his motorcycle again and resuming work as Clinical cytogeneticist for [employer].   OT recommended supervised golf, specifically putting or driving practice, as appropriate reinforcement activity while recovering functional balance.     Therapy Documentation Precautions:  Precautions Precautions: Fall Precaution Comments: leans to left Restrictions Weight Bearing Restrictions: No  Pain: Pain Assessment Pain Assessment: No/denies pain  See FIM for current functional  status  Therapy/Group: Individual Therapy  [place] 05/28/2014, 2:04 PM

## 2014-05-28 NOTE — Progress Notes (Signed)
Physical Therapy Session Note  Patient Details  Name: Alejandro Schneider MRN: [id number] Date of Birth: [birth year]  Today's Date: 05/28/2014 PT Individual Time: 1100-1200 PT Individual Time Calculation (min): 60 min   Short Term Goals: Week 1:  PT Short Term Goal 1 (Week 1): = LTGs due to anticipated short LOS  Skilled Therapeutic Interventions/Progress Updates:   Pt received sitting in recliner, reporting increased dizziness compared to yesterday with c/o "head spinning" that is exacerbated when patient is in bed with eyes closed. Gait training without device with cues for increased L step length/smoothness of gait, 2 x 150 ft with supervision. Pt performed alternating single leg stance with mirror in front for visual feedback with progressively decreased tactile input - R hand on rail > R hand on wall > no UE support, supervision to min-mod A without UE support. Progressed to cone taps and stair taps with RUE on wall > no UE support with min-mod A. Pt kicked soccer ball in standing using LLE to facilitate increased weight shift to R with close supervision. Progressed activity to ambulating in hallway while passing soccer ball back and forth to therapist to work on weight shifting and dynamic balance with supervision, increased LOB to L with crossing foot over foot. Pt performed standing trunk rotation to R and L with progressively narrower BOS with supervision. Pt participated in functional game of putting with golf club including squatting to place golf ball on floor, wide BOS > narrow BOS > standing on foam pad with supervision and no LOB. Pt performed hip abductor strengthening and weight shifting, pushing BOSU ball round side up with lateral border of foot to R and L. Pt left sitting in recliner with all needs within reach.   Therapy Documentation Precautions:  Precautions Precautions: Fall Precaution Comments: leans to left Restrictions Weight Bearing Restrictions: No Pain: Pain  Assessment Pain Assessment: No/denies pain Locomotion : Ambulation Ambulation/Gait Assistance: 5: Supervision   See FIM for current functional status  Therapy/Group: Individual Therapy  Laretta Alstrom 05/28/2014, 12:05 PM

## 2014-05-29 ENCOUNTER — Inpatient Hospital Stay (HOSPITAL_COMMUNITY): Payer: 59 | Admitting: Physical Therapy

## 2014-05-29 ENCOUNTER — Inpatient Hospital Stay (HOSPITAL_COMMUNITY): Payer: 59 | Admitting: Occupational Therapy

## 2014-05-29 ENCOUNTER — Inpatient Hospital Stay (HOSPITAL_COMMUNITY): Payer: 59

## 2014-05-29 DIAGNOSIS — M25512 Pain in left shoulder: Secondary | ICD-10-CM

## 2014-05-29 MED ORDER — BACLOFEN 20 MG PO TABS
20.0000 mg | ORAL_TABLET | Freq: Three times a day (TID) | ORAL | Status: DC
  Administered 2014-05-29: 20 mg via ORAL
  Filled 2014-05-29 (×9): qty 1

## 2014-05-29 MED ORDER — BISACODYL 10 MG RE SUPP
10.0000 mg | Freq: Every day | RECTAL | Status: DC | PRN
  Administered 2014-05-30: 10 mg via RECTAL
  Filled 2014-05-29: qty 1

## 2014-05-29 NOTE — Progress Notes (Signed)
Social Work Patient ID: Alejandro Schneider, male   DOB: [birth year], 64 y.o.   MRN: [id number] Met with pt and niece to discuss team conference golas-supervision level and discharge 2/26 after therapies.  He is fine with this and agreeable to Op therapies. His hiccups are better and is aware the baclofen will be decreased by MD.  Wife will be here later, see her to ask if any questions.

## 2014-05-29 NOTE — Progress Notes (Signed)
Subjective/Complaints:  hiccups improving, Left shouldre pain, which preceded CVA Review of Systems - Negative except occ small emesis, nausea, no abd pain  Objective: Vital Signs: Blood pressure 133/82, pulse 61, temperature 98.4 F (36.9 C), temperature source Oral, resp. rate 18, height 5\' 10"  (1.778 m), weight 88.225 kg (194 lb 8 oz), SpO2 100 %. No results found. No results found for this or any previous visit (from the past 72 hour(s)).   HEENT: normal Cardio: RRR and no murmur Resp: CTA B/L and unlabored GI: BS positive and NT, ND Extremity:  Pulses positive and No Edema Skin:   Intact Neuro: Alert/Oriented, Cranial Nerve II-XII normal, Abnormal Sensory reduced sensation on right side, Abnormal Motor decreased R foot toe flex/ext, Abnormal FMC Ataxic/ dec FMC and no left lateral pulsion in sitting Musc/Skel:  Left shoulder Pos Neers sign, full ROM, Neg drop arm test, pain ant AC area Gen NAD, continued hiccups   Assessment/Plan: 1. 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 Physiatrist is providing close team supervision and 24 hour management of active medical problems listed below. Physiatrist and rehab team continue to assess barriers to discharge/monitor patient progress toward functional and medical goals. Team conference today please see physician documentation under team conference tab, met with team face-to-face to discuss problems,progress, and goals. Formulized individual treatment plan based on medical history, underlying problem and comorbidities. FIM: FIM - Bathing Bathing Steps Patient Completed: Chest, Right Arm, Left upper leg, Right lower leg (including foot), Left Arm, Abdomen, Front perineal area, Buttocks, Right upper leg, Left lower leg (including foot) Bathing: 4: Steadying assist  FIM - Upper Body  Dressing/Undressing Upper body dressing/undressing steps patient completed: Thread/unthread right sleeve of pullover shirt/dresss, Thread/unthread left sleeve of pullover shirt/dress, Put head through opening of pull over shirt/dress, Pull shirt over trunk Upper body dressing/undressing: 5: Supervision: Safety issues/verbal cues FIM - Lower Body Dressing/Undressing Lower body dressing/undressing steps patient completed: Thread/unthread right underwear leg, Thread/unthread left underwear leg, Pull underwear up/down, Thread/unthread right pants leg, Thread/unthread left pants leg, Pull pants up/down, Don/Doff right sock, Don/Doff left sock, Don/Doff right shoe, Don/Doff left shoe, Fasten/unfasten right shoe, Fasten/unfasten left shoe Lower body dressing/undressing: 5: Set-up assist to: Obtain clothing  FIM - Toileting Toileting steps completed by patient: Adjust clothing after toileting, Performs perineal hygiene, Adjust clothing prior to toileting Toileting Assistive Devices: Grab bar or rail for support Toileting: 5: Supervision: Safety issues/verbal cues  FIM - Radio producer Devices: Grab bars Toilet Transfers: 4-To toilet/BSC: Min A (steadying Pt. > 75%)  FIM - Bed/Chair Transfer Bed/Chair Transfer: 5: Bed > Chair or W/C: Supervision (verbal cues/safety issues), 5: Chair or W/C > Bed: Supervision (verbal cues/safety issues)  FIM - Locomotion: Wheelchair Locomotion: Wheelchair: 0: Activity did not occur FIM - Locomotion: Ambulation Locomotion: Ambulation Assistive Devices: Other (comment) (none) Ambulation/Gait Assistance: 5: Supervision Locomotion: Ambulation: 5: Travels 150 ft or more with supervision/safety issues  Comprehension Comprehension Mode: Auditory Comprehension: 7-Follows complex conversation/direction: With no assist  Expression Expression Mode: Verbal Expression: 7-Expresses complex ideas: With no assist  Social Interaction Social  Interaction: 7-Interacts appropriately with others - No medications needed.  Problem Solving Problem Solving Mode: Not assessed Problem Solving: 6-Solves complex problems: With extra time  Memory Memory Mode: Not assessed Memory: 7-Complete Independence: No helper    Medical Problem List and Plan: 1. Functional deficits secondary to left lateral  medullary infarct secondary to posterior circulation large vessel atherosclerosis. 2. DVT Prophylaxis/Anticoagulation: Subcutaneous Lovenox. Monitor platelet counts or any signs of bleeding 3. Pain Management: Tylenol as needed, hemifacial pain trigeminal distribution 4. Hyperlipidemia. Lipitor 5. Neuropsych: This patient is capable of making decisions on his own behalf. 6. Skin/Wound Care: Routine skin checks 7. Fluids/Electrolytes/Nutrition: Strict I&O with follow-up chemistries 8. Right vertebral and basilar artery stenosis. Initial attempts at stenting unsuccessful. Plan to discuss reattempts at stenting in the next few weeks with interventional radiology 10. Persistent hiccups---central origin---  -on max dose baclofen--works per wife pt is sleeping better  -cont reglan $RemoveBefo'5mg'[id number]$  tid ac, had some reflux with hiccups 11. Shoulder pain , suspect subacromial impingement given age and chronicity, check shoulder Xray LOS (Days) 6 A FACE TO FACE EVALUATION WAS PERFORMED  KIRSTEINS,ANDREW E 05/29/2014, 7:14 AM

## 2014-05-29 NOTE — Progress Notes (Signed)
Occupational Therapy Session Note  Patient Details  Name: Alejandro Schneider MRN: [id number] Date of Birth: March 27, 1951  Today's Date: 05/29/2014 OT Individual Time: 1430-1500 OT Individual Time Calculation (min): 30 min    Short Term Goals: Week 1:  OT Short Term Goal 1 (Week 1): STGs equal to LTGs set at modified independent level  Skilled Therapeutic Interventions/Progress Updates:    Mr. Hard ambulated to the therapy gym with min assist, demonstrating occasional LOB to the left side.  He continues to exhibit compensatory strategies with head and trunk tilt to the right as well as rounded shoulders.  Once in the gym had pt work on standing on foam block without support while performing small head turns and weightshifts side to side.  Progressed to working on squating to standing.  Once LOB to the left during task.  Transitioned to stepping forward and backward off of the foam as well with min assist and LOB to the left.  Also worked on toe taps with solid surface with supervision and then progressed to tapping moving ball.  He needed constant min assist for use of the ball.  Finished session with stepping forward and backward over small obstacle.  Pt with decreased ability to smoothly transition backward over object, especially when leading with the RLE.  Pt still with constant nystagmus except when performing initial head turn and finding object.    Therapy Documentation Precautions:  Precautions Precautions: Fall Precaution Comments: leans to left Restrictions Weight Bearing Restrictions: No  Pain: Pain Assessment Pain Assessment: No/denies pain Pain Score: 0-No pain ADL: See FIM for current functional status  Therapy/Group: Individual Therapy  Deajah Erkkila OTR/L 05/29/2014, 3:55 PM

## 2014-05-29 NOTE — Progress Notes (Signed)
Occupational Therapy Session Note  Patient Details  Name: Alejandro Schneider MRN: [id number] Date of Birth: December 25, 1950  Today's Date: 05/29/2014 OT Individual Time: 1100-1200 OT Individual Time Calculation (min): 60 min    Short Term Goals: Week 1:  OT Short Term Goal 1 (Week 1): STGs equal to LTGs set at modified independent level  Skilled Therapeutic Interventions/Progress Updates:    Pt sitting in recliner upon arrival.  Pt stated he had just vomited and was getting ready to notify nurse.  Pt stated he felt okay and this happened sometimes after his hiccups.  Pt stated he no long had hiccups but anticipated they would return after he started moving again.  Pt amb with AD in room to gather clothing prior to entering bathroom to bathe at shower level.  Pt completed all bathing and dressing tasks, in addition to grooming tasks while standing, at supervision level.  Pt amb without AD to therapy gym and engaged in variety of balance tasks including throwing weighted ball against mini trampoline while standing on compliant surface, engaging in modified badminton game, tossing large ball up and catching while ambulating, side stepping, accelerated ambulation, and retrieving items from elevated surface.  Pt exhibited LOB X 1 when turning rapidly which required max A to correct.  Pt also stated that he would be moving into his mother's home (currently uninhabited) which has a walk-in shower.  Focus on BADL retraining, dynamic standing balance, functional amb without AD for home mgmt tasks, and safety awareness.  Therapy Documentation Precautions:  Precautions Precautions: Fall Precaution Comments: leans to left Restrictions Weight Bearing Restrictions: No   Pain: Pain Assessment Pain Assessment: No/denies pain  See FIM for current functional status  Therapy/Group: Individual Therapy  Leroy Libman 05/29/2014, 2:12 PM

## 2014-05-29 NOTE — Progress Notes (Signed)
Physical Therapy Session Note  Patient Details  Name: Alejandro Schneider MRN: [id number] Date of Birth: 09/20/1950  Today's Date: 05/29/2014 PT Individual Time: 0930-1030 and [id number] PT Individual Time Calculation (min): 60 min and 30 min  Short Term Goals: Week 1:  PT Short Term Goal 1 (Week 1): = LTGs due to anticipated short LOS  Skilled Therapeutic Interventions/Progress Updates:   Session 1: Pt received sitting edge of bed, reporting continued hiccups and dizziness this date. Pt ambulated to bathroom with S-min guard and stood to urinate with UE support on wall, mod I. Pt ambulated to sink to wash hands with close supervision. Gait training x 150 ft with close supervision, increased staggering to L compared to yesterday but able to recover independently. For NMR, balance, and weight shift training, patient performed quadruped with progression of: extending alternating upper extremities > extending alternating lower extremities > extending alternating opposite UE and LE > extending alternating opposite UE and LE with touching elbow to knee without rest breaks between. Pt tolerated well with tactile cues to decrease hip/trunk rotation and maintain midline orientation. Pt ambulated with balance board between BLE to provide guidance/balance challenge for narrower BOS with min A progressed to supervision. Pt performed tandem gait with min-mod A for balance. Patient worked on Dietitian with Arts administrator weighted ball under variety of conditions: regular stance > narrow BOS > tandem stance leading with each LE > alternating single leg stance with RUE supported on chair back > foam pad with regular BOS > foam pad with narrow BOS. Pt performed NuStep using BUE/BLE at level 7 x 10 min for strengthening and endurance. Pt ambulated back to room without device with close supervision, minimal LOB to left but recovered independently and left sitting in recliner with all needs within  reach.   Session 2: Pt received sitting in recliner, friend present for session. Session focused on gait, weight shifting, midline orientation, and dynamic balance. Pt ambulated to bathroom and to sink to wash hands with supervision and performed toileting with mod I. Gait training x 150 ft with supervision, pt demonstrated improved midline orientation and decreased staggering to left compared to previous session. Pt pushed punching bag in gym for strengthening, donned boxing gloves, and punched bag with alternating UEs and verbal/tactile cues for upright posture and head midline orientation. To challenge weight shifting, coordination, and single leg balance, patient completed LE taps to targets on floor in different sequences using alternating BLE. Pt requires supervision-mod A for balance with increased difficulty with LLE stance due to LOB to left. With mirror for visual feedback, patient performed alternating step taps to cones to facilitate weight shifting and balance with supervision-min A. Pt demonstrated improved control/balance with exercises with continued repetition. To challenge coordination and dynamic balance with gait, patient performed gait with eyes closed, retro gait, sidestepping to L and R, and [place] to L and R with supervision-min A. Pt returned to room with supervision and left sitting in recliner with all needs within reach, friend present.   Therapy Documentation Precautions:  Precautions Precautions: Fall Precaution Comments: leans to left Restrictions Weight Bearing Restrictions: No Pain: Pain Assessment Pain Assessment: No/denies pain Locomotion : Ambulation Ambulation/Gait Assistance: 5: Supervision   See FIM for current functional status  Therapy/Group: Individual Therapy  Laretta Alstrom 05/29/2014, 11:22 AM

## 2014-05-29 NOTE — Patient Care Conference (Signed)
Inpatient RehabilitationTeam Conference and Plan of Care Update Date: 05/29/2014   Time: 11;55 AM    Patient Name: Alejandro Schneider      Medical Record Number: [id number]  Date of Birth: Nov 22, 1950 Sex: Male         Room/Bed: 4M04C/4M04C-01 Payor Info: Payor: Advertising copywriter / Plan: [employer] OTHER / Product Type: *No Product type* /    Admitting Diagnosis: L CVA  Admit Date/Time:  05/23/2014  6:00 PM Admission Comments: No comment available   Primary Diagnosis:  <principal problem not specified> Principal Problem: <principal problem not specified>  Patient Active Problem List   Diagnosis Date Noted  . 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 Essential hypertension   . CVA (cerebral infarction) 05/23/2014  . Basilar artery stenosis   . 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 Vertigo   . HLD (hyperlipidemia)   . 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 Intractable hiccups   . Stroke 05/20/2014  . Insufficiency of posterior brain circulation   . Rash and nonspecific skin eruption 04/10/2014  . Seborrheic keratoses 01/29/2014  . Skin irritation 09/06/2013  . Pain in joint, shoulder region 09/06/2013  . Nevus 08/26/2011  . FH: prostate cancer 08/26/2011  . Routine general medical examination at a health care facility 08/12/2010  . HYPERCHOLESTEROLEMIA 07/11/2007    Expected Discharge Date: Expected Discharge Date: 05/31/14  Team Members Present: Physician leading conference: Dr. Claudette Laws Social Worker Present: Dossie Der, LCSW Nurse Present: Carmie End, RN PT Present: Bayard Hugger, PT;Emily Marya Amsler, PT OT Present: Perrin Maltese, Domenic Schwab, OT SLP Present: Fae Pippin, SLP     Current Status/Progress Goal Weekly Team Focus  Medical   Hiccups improving, less dizziness, no nausea or vomiting last night  Manage vestibular symptoms to allow for participation in therapy program  Continue  current medications, monitor and adjust as needed   Bowel/Bladder   Continent of bowel and bladder. LBM 05/25/14  Pt to remain continent of bowel and bladder.  Monitor   Swallow/Nutrition/ Hydration     na        ADL's   supervision overall with BADLs; occasional steady A with quick tranmsitional movements  mod I overall  balance training; BADL retraining; safety awareness   Mobility   supervision overall without device, requiring up to max A with LOB to left  mod I   NMR, midline orientation, postural control, vestib rehab, pt/family education   Communication     na        Safety/Cognition/ Behavioral Observations    no unsafe behaviors        Pain   No c/o pain. Intermittent headache which is controlled with Tylenol 650mg  q 4hrs  <3  Monitor for nonverbals cues of pain.   Skin   R groin incision with small open area draining scant amount of serous drainage. Dry dressing to area  No addtional skin breakdown  Monitor R groin area q shift for appropriate healing      *See Care Plan and progress notes for long and short-term goals.  Barriers to Discharge: Still leaning to the left with ambulation    Possible Resolutions to Barriers:  Continue rehabilitation    Discharge Planning/Teaching Needs:  Home with wife who can flex her work schedule to provide care.      Team Discussion:  Making progress-decrease baclofen for hiccups. Shoulder sore-medicating.  Tendon tear x-ray shows. Vestibular issues.  Revisions to Treatment Plan:  Downgrade goals to supervision level   Continued Need for Acute Rehabilitation Level of Care: The patient requires daily medical management by a physician with specialized training in physical medicine and rehabilitation for the following conditions: Daily direction of a multidisciplinary physical rehabilitation program to ensure safe treatment while eliciting the highest outcome that is of practical value to the patient.: Yes Daily medical management  of patient stability for increased activity during participation in an intensive rehabilitation regime.: Yes Daily analysis of laboratory values and/or radiology reports with any subsequent need for medication adjustment of medical intervention for : Neurological problems  Daleon Willinger, Lemar Livings 05/29/2014, 1:48 PM

## 2014-05-30 ENCOUNTER — Inpatient Hospital Stay (HOSPITAL_COMMUNITY): Payer: 59 | Admitting: Physical Therapy

## 2014-05-30 ENCOUNTER — Inpatient Hospital Stay (HOSPITAL_COMMUNITY): Payer: 59 | Admitting: Rehabilitation

## 2014-05-30 ENCOUNTER — Encounter (HOSPITAL_COMMUNITY): Payer: 59 | Admitting: Occupational Therapy

## 2014-05-30 NOTE — Discharge Summary (Signed)
Discharge summary job [id number]

## 2014-05-30 NOTE — Progress Notes (Signed)
Physical Therapy Session Note  Patient Details  Name: Alejandro Schneider MRN: [id number] Date of Birth: 08/27/1950  Today's Date: 05/30/2014 PT Individual Time: 1400-1500 PT Individual Time Calculation (min): 60 min   Short Term Goals: Week 1:  PT Short Term Goal 1 (Week 1): = LTGs due to anticipated short LOS  Skilled Therapeutic Interventions/Progress Updates:   Pt received sitting in recliner in room, agreeable to outing.  Pt ambulated to/from restroom at S level and voided prior to leaving room.  Assisted pt downstairs and back to unit via w/c for energy conservation.  Skilled session focused on community re-integration with ambulation, dynamic balance, getting in/out of Lucianne Lei, negotiation of curb step, education and discussion of barriers to going out in community, safety at home, and how to best manage symptoms when out in community.  Performed all mobility at S level with min A to get into/out of van. See paper documentation for full details.    Therapy Documentation Precautions:  Precautions Precautions: Fall Precaution Comments: leans to left Restrictions Weight Bearing Restrictions: No   Vital Signs: Therapy Vitals Temp: 98 F (36.7 C) Temp Source: Oral Pulse Rate: 64 Resp: 17 BP: (!) 145/89 mmHg Patient Position (if appropriate): Sitting Oxygen Therapy SpO2: 99 % O2 Device: Not Delivered Pain: Pt with no c/o pain during session.    Locomotion : Ambulation Ambulation/Gait Assistance: 5: Supervision   See FIM for current functional status  Therapy/Group: Co-Treatment with RT  Ravleen Ries, Betha Loa 05/30/2014, 3:37 PM

## 2014-05-30 NOTE — Discharge Instructions (Signed)
Inpatient Rehab Discharge Instructions  Alejandro Schneider Discharge date and time: No discharge date for patient encounter.   Activities/Precautions/ Functional Status: Activity: activity as tolerated Diet: cardiac diet Wound Care: none needed Functional status:  ___ No restrictions     ___ Walk up steps independently ___ 24/7 supervision/assistance   ___ Walk up steps with assistance ___ Intermittent supervision/assistance  ___ Bathe/dress independently ___ Walk with walker     ___ Bathe/dress with assistance ___ Walk Independently    ___ Shower independently _x__ Walk with assistance    ___ Shower with assistance ___ No alcohol     ___ Return to work/school ________  Special Instructions:    COMMUNITY REFERRALS UPON DISCHARGE:    Outpatient: PT-VESTIBULAR  Agency:[facility] TKWIO:[phone] Date of Last Service:05/31/2014  Appointment Date/Time: MARCH 8 Tuesday  8;30-9;30 AM  Medical Equipment/Items Ordered:NO NEEDS   GENERAL COMMUNITY RESOURCES FOR PATIENT/FAMILY: Support Groups:[facility] SUPPORT GROUP  No driving STROKE/TIA DISCHARGE INSTRUCTIONS SMOKING Cigarette smoking nearly doubles your risk of having a stroke & is the single most alterable risk factor  If you smoke or have smoked in the last 12 months, you are advised to quit smoking for your health.  Most of the excess cardiovascular risk related to smoking disappears within a year of stopping.  Ask you doctor about anti-smoking medications  [place] Quit Line: 1-800-QUIT NOW  Free Smoking Cessation Classes [phone]  CHOLESTEROL Know your levels; limit fat & cholesterol in your diet  Lipid Panel     Component Value Date/Time   CHOL 189 05/21/2014 0234   TRIG 148 05/21/2014 0234   HDL 38* 05/21/2014 0234   CHOLHDL 5.0 05/21/2014 0234   VLDL 30 05/21/2014 0234   LDLCALC 121* 05/21/2014 0234      Many patients benefit from treatment even if their cholesterol is at goal.  Goal: Total  Cholesterol (CHOL) less than 160  Goal:  Triglycerides (TRIG) less than 150  Goal:  HDL greater than 40  Goal:  LDL (LDLCALC) less than 100   BLOOD PRESSURE American Stroke Association blood pressure target is less that 120/80 mm/Hg  Your discharge blood pressure is:  BP: (!) 152/71 mmHg  Monitor your blood pressure  Limit your salt and alcohol intake  Many individuals will require more than one medication for high blood pressure  DIABETES (A1c is a blood sugar average for last 3 months) Goal HGBA1c is under 7% (HBGA1c is blood sugar average for last 3 months)  Diabetes: No known diagnosis of diabetes    Lab Results  Component Value Date   HGBA1C 5.6 05/21/2014     Your HGBA1c can be lowered with medications, healthy diet, and exercise.  Check your blood sugar as directed by your physician  Call your physician if you experience unexplained or low blood sugars.  PHYSICAL ACTIVITY/REHABILITATION Goal is 30 minutes at least 4 days per week  Activity: Increase activity slowly, Therapies: Physical Therapy: Home Health Return to work:   Activity decreases your risk of heart attack and stroke and makes your heart stronger.  It helps control your weight and blood pressure; helps you relax and can improve your mood.  Participate in a regular exercise program.  Talk with your doctor about the best form of exercise for you (dancing, walking, swimming, cycling).  DIET/WEIGHT Goal is to maintain a healthy weight  Your discharge diet is: Diet Heart  liquids Your height is:  Height: 5\' 10"  (177.8 cm) Your current weight is: Weight:  95.074 kg (209 lb 9.6 oz) Your Body Mass Index (BMI) is:  BMI (Calculated): 30.1  Following the type of diet specifically designed for you will help prevent another stroke.  Your goal weight range is:    Your goal Body Mass Index (BMI) is 19-24.  Healthy food habits can help reduce 3 risk factors for stroke:  High cholesterol, hypertension, and excess  weight.  RESOURCES Stroke/Support Group:  Call [phone]   STROKE EDUCATION PROVIDED/REVIEWED AND GIVEN TO PATIENT Stroke warning signs and symptoms How to activate emergency medical system (call 911). Medications prescribed at discharge. Need for follow-up after discharge. Personal risk factors for stroke. Pneumonia vaccine given:  Flu vaccine given:  My questions have been answered, the writing is legible, and I understand these instructions.  I will adhere to these goals & educational materials that have been provided to me after my discharge from the hospital.      My questions have been answered and I understand these instructions. I will adhere to these goals and the provided educational materials after my discharge from the hospital.  Patient/Caregiver Signature _______________________________ Date __________  Clinician Signature _______________________________________ Date __________  Please bring this form and your medication list with you to all your follow-up doctor's appointments.

## 2014-05-30 NOTE — Progress Notes (Signed)
Recreational Therapy Session Note  Patient Details  Name: Alejandro Schneider MRN: [id number] Date of Birth: 04-Jun-1950 Today's Date: 05/30/2014  Pain: no c/o Skilled Therapeutic Interventions/Progress Updates: Pt referred by team to participate in community reintegration/outing.  Pt agreeable.  Pt participated in outing to [employer] at overall supervision ambulatory level uisng [facility] & min assist for van steps.  Goals focused on safe mobility on various community surfaces, identification and negotiation of obstacles, accessing public restrooms, UE use, energy conservation, and how best to mange his symptoms ("dizziness").  See outing goal sheet in shadow chart for full details.  Therapy/Group: Co-Treatment  Norine Reddington 05/30/2014, 3:56 PM

## 2014-05-30 NOTE — Progress Notes (Signed)
Subjective/Complaints:  hiccups improving,Has dizziness with metaclopramide Review of Systems - Negative except occ small emesis, nausea, no abd pain  Objective: Vital Signs: Blood pressure 134/76, pulse 63, temperature 98.2 F (36.8 C), temperature source Oral, resp. rate 18, height 5\' 10"  (1.778 m), weight 88.225 kg (194 lb 8 oz), SpO2 98 %. Dg Shoulder 1v Left  05/29/2014   CLINICAL DATA:  M25.512 (ICD-10-CM) - Nontraumatic shoulder pain, leftPain is anterior to shoulder joint and is painful to lift arm Symptoms for several weeksPatient says he does lots of lifting at workPatient states he had a stroke and that's why he was addmited to [facility].Hx of surgery in Lumbar 2 years ago  EXAM: LEFT SHOULDER - 1 VIEW  COMPARISON:  None.  FINDINGS: No fracture. No bone lesion. Glenohumeral and AC joints are normally aligned. There are mild AC joint osteoarthritic changes.  Subacromial space is narrowed to 5 mm.  Soft tissues are unremarkable.  IMPRESSION: No fracture or dislocation. Mild AC joint osteoarthritis. Narrowing of the subacromial space to 5 mm suggesting an underlying rotator cuff tendon tear. Consider follow-up shoulder MRI.   Electronically Signed   By: Lajean Manes M.D.   On: 05/29/2014 09:50   No results found for this or any previous visit (from the past 72 hour(s)).   HEENT: normal Cardio: RRR and no murmur Resp: CTA B/L and unlabored GI: BS positive and NT, ND Extremity:  Pulses positive and No Edema Skin:   Intact Neuro: Alert/Oriented, Cranial Nerve II-XII normal, Abnormal Sensory reduced sensation on right side, Abnormal Motor decreased R foot toe flex/ext, Abnormal FMC Ataxic/ dec FMC and no left lateral pulsion in sitting Musc/Skel:  Left shoulder Pos Neers sign, full ROM, Neg drop arm test, pain ant AC area Gen NAD, continued hiccups   Assessment/Plan: 1. 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 Physiatrist is providing close team supervision and 24 hour management of active medical problems listed below. Physiatrist and rehab team continue to assess barriers to discharge/monitor patient progress toward functional and medical goals. Plan D/C in am FIM: FIM - Bathing Bathing Steps Patient Completed: Chest, Right Arm, Left upper leg, Right lower leg (including foot), Left Arm, Abdomen, Front perineal area, Buttocks, Right upper leg, Left lower leg (including foot) Bathing: 5: Supervision: Safety issues/verbal cues  FIM - Upper Body Dressing/Undressing Upper body dressing/undressing steps patient completed: Thread/unthread right sleeve of pullover shirt/dresss, Thread/unthread left sleeve of pullover shirt/dress, Put head through opening of pull over shirt/dress, Pull shirt over trunk Upper body dressing/undressing: 7: Complete Independence: No helper FIM - Lower Body Dressing/Undressing Lower body dressing/undressing steps patient completed: Thread/unthread right underwear leg, Thread/unthread left underwear leg, Pull underwear up/down, Thread/unthread right pants leg, Thread/unthread left pants leg, Pull pants up/down, Don/Doff right sock, Don/Doff left sock, Don/Doff right shoe, Don/Doff left shoe, Fasten/unfasten right shoe, Fasten/unfasten left shoe Lower body dressing/undressing: 5: Supervision: Safety issues/verbal cues  FIM - Toileting Toileting steps completed by patient: Adjust clothing after toileting, Performs perineal hygiene, Adjust clothing prior to toileting Toileting Assistive Devices: Grab bar or rail for support Toileting: 5: Supervision: Safety issues/verbal cues  FIM - Radio producer Devices: Grab bars Toilet Transfers: 4-To toilet/BSC: Min A (steadying Pt. > 75%)  FIM - Bed/Chair Transfer Bed/Chair Transfer: 5: Bed > Chair or W/C: Supervision (verbal cues/safety  issues), 5: Chair or W/C > Bed: Supervision (  verbal cues/safety issues)  FIM - Locomotion: Wheelchair Locomotion: Wheelchair: 0: Activity did not occur FIM - Locomotion: Ambulation Locomotion: Ambulation Assistive Devices: Other (comment) (none) Ambulation/Gait Assistance: 5: Supervision Locomotion: Ambulation: 5: Travels 150 ft or more with supervision/safety issues  Comprehension Comprehension Mode: Auditory Comprehension: 7-Follows complex conversation/direction: With no assist  Expression Expression Mode: Verbal Expression: 7-Expresses complex ideas: With no assist  Social Interaction Social Interaction: 7-Interacts appropriately with others - No medications needed.  Problem Solving Problem Solving Mode: Not assessed Problem Solving: 6-Solves complex problems: With extra time  Memory Memory Mode: Not assessed Memory: 7-Complete Independence: No helper    Medical Problem List and Plan: 1. 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 2. DVT Prophylaxis/Anticoagulation: Subcutaneous Lovenox. Monitor platelet counts or any signs of bleeding 3. Pain Management: Tylenol as needed, hemifacial pain trigeminal distribution 4. Hyperlipidemia. Lipitor 5. Neuropsych: This patient is capable of making decisions on his own behalf. 6. Skin/Wound Care: Routine skin checks 7. Fluids/Electrolytes/Nutrition: Strict I&O with follow-up chemistries 8. Right vertebral and basilar artery stenosis. Initial attempts at stenting unsuccessful. Plan to discuss reattempts at stenting in the next few weeks with interventional radiology 10. Persistent hiccups---central origin---  -on max dose baclofen--works per wife pt is sleeping better  -D/C reglan due to wooziness 11. Shoulder pain , suspect subacromial impingement given age and chronicity, check shoulder Xray LOS (Days) 7 A FACE TO FACE EVALUATION WAS  PERFORMED  Alejandro Schneider E 05/30/2014, 6:49 AM

## 2014-05-30 NOTE — Progress Notes (Signed)
Patient refused baclofen this am due to "makes me too sleepy during the day". Patient reports he will continue to take baclofen at night. Silvestre Mesi PA aware. Continue with plan of care . No new orders. Mliss Sax

## 2014-05-30 NOTE — Progress Notes (Signed)
Physical Therapy Session Note  Patient Details  Name: Alejandro Schneider MRN: [id number] Date of Birth: [birth year]  Today's Date: 05/30/2014 PT Individual Time: 0830-0930 PT Individual Time Calculation (min): 60 min   Short Term Goals: Week 1:  PT Short Term Goal 1 (Week 1): = LTGs due to anticipated short LOS  Skilled Therapeutic Interventions/Progress Updates:   Pt received sitting in recliner, reporting having a good night and morning until taking a pill that made him "swimmy-headed." MD aware. Sitting in recliner, pt donned ted hose and shoes with mod I. Pt with increased staggering/lean to left compared to yesterday PM during gait >1000 ft with report of increased dizziness, overall supervision and able to recover independently. Gait training trialling use of SPC in right hand to facilitate increased weight shift to R with initial demonstration for two-point gait pattern. Pt demonstrates improved midline orientation and decreased staggering to left with use of SPC at overall supervision level x 150 ft. To challenge dynamic standing balance with weight shifts, performed ball toss in all directions with improved midline orientation of body but continued cues for head midline positioning to decrease R cervical lateral flexion/rotation, normal BOS > wide tandem stance leading with RLE > wide tandem stance leading with LLE with no LOB. For lateral weight shifting and BLE NMR, patient performed tall kneeling > half kneeling alternating lower extremities x 30 with supervision and verbal cues for weight shift to R and trunk/hip extension to neutral. Patient performed tall kneeling <> half kneeling <> standing x 4 leading with each LE with close supervision, increased difficulty leading with RLE but able to maintain balance without physical assist. Pt negotiated up/down 15 stairs without UE support to work on weight shifting and balance with close supervision-min A and cues for decreased speed for safety and  shift to R due to increased lean left. Patient performed treadmill gait training for increased repetition, at speed of 1.1-1.3 mph, 0 incline, x 3.5 min for a distance of 375 ft. When decreasing from BUE support to RUE support, patient required min > mod tactile cues at hip/trunk to facilitate weight shift to R and max verbal cues for increased L step length. Patient tolerated treadmill training well. After treadmill training, pt demonstrated improved midline orientation with decreased staggering when ambulating overground back to room carrying a cup of water with supervision. Pt returned to room and left sitting in recliner with all needs within reach.   Therapy Documentation Precautions:  Precautions Precautions: Fall Precaution Comments: leans to left Restrictions Weight Bearing Restrictions: No Pain: Pain Assessment Pain Assessment: No/denies pain  See FIM for current functional status  Therapy/Group: Individual Therapy  Laretta Alstrom 05/30/2014, 9:32 AM

## 2014-05-30 NOTE — Progress Notes (Signed)
Occupational Therapy Session Note  Patient Details  Name: Alejandro Schneider MRN: [id number] Date of Birth: [birth year]  Today's Date: 05/30/2014 OT Individual Time: 1105-1205 OT Individual Time Calculation (min): 60 min    Short Term Goals: Week 1:  OT Short Term Goal 1 (Week 1): STGs equal to LTGs set at modified independent level  Skilled Therapeutic Interventions/Progress Updates:    Pt performed bathing and dressing sit to stand during the session.  He was able to gather clothing prior to shower with supervision, ambulating to the drawer without use of an assistive device.  He completed shower with supervision sit to stand using the tub bench.  Dressing sit to stand at the EOB with close supervision as well with modified independent level for UB.  Once finished pt ambulated down to the gym for further work on balance.  Still with increased lateral cervical flexion to the right during mobility with pt also maintaining flexed trunk and rounded shoulders.  Once in the gym focused on standing balance using foam pad for standing to increase ankle strategies and midline orientation.  Provided mirror for visual feedback on head positioning.  He needed min assist for squat to stand while standing on the foam as well.  One LOB to the left noted with pt needed min assist to self correct.  He also worked on stepping on and off of the foam.  He needed min assist initially with stepping backward off of the foam with the RLE first.  Decreased ability to perform smooth LLE transition from the foam to the floor, but it improved with increased practice.  Pt still reporting and demonstrating increased bilateral nystagmus which remains constant.    Therapy Documentation Precautions:  Precautions Precautions: Fall Precaution Comments: leans to left Restrictions Weight Bearing Restrictions: No  Pain:  No report of pain  ADL: See FIM for current functional status  Therapy/Group: Individual  Therapy  Mahdiya Mossberg OTR/L 05/30/2014, 3:25 PM

## 2014-05-30 NOTE — Progress Notes (Signed)
Social Work Patient ID: Alejandro Schneider, male   DOB: 1950/06/18, 64 y.o.   MRN: [id number] Met with wife to discuss team conference goals and discharge.  She is pleased with the plans and will complete form for STD and get back to worker to fax. See in am to wrap up any concerns or questions.

## 2014-05-31 MED ORDER — FAMOTIDINE 20 MG PO TABS: 20.0000 mg | ORAL_TABLET | Freq: Two times a day (BID) | ORAL | Status: DC

## 2014-05-31 MED ORDER — BACLOFEN 20 MG PO TABS: 20.0000 mg | ORAL_TABLET | Freq: Three times a day (TID) | ORAL | Status: DC

## 2014-05-31 MED ORDER — CLOPIDOGREL BISULFATE 75 MG PO TABS: 75.0000 mg | ORAL_TABLET | Freq: Every day | ORAL | Status: DC

## 2014-05-31 MED ORDER — ASPIRIN 81 MG PO TBEC: 81.0000 mg | DELAYED_RELEASE_TABLET | Freq: Every day | ORAL | Status: DC

## 2014-05-31 MED ORDER — ATORVASTATIN CALCIUM 20 MG PO TABS
20.0000 mg | ORAL_TABLET | Freq: Every day | ORAL | Status: DC
Start: 2014-05-31 — End: 2014-06-17

## 2014-05-31 NOTE — Discharge Summary (Signed)
NAME:  Alejandro Schneider, Alejandro Schneider NO.:  [id number]  MEDICAL RECORD NO.:  [id number]  LOCATION:  4M04C                        FACILITY:  [facility]  PHYSICIAN:  [name], M.D.DATE OF BIRTH:  01-05-1951  DATE OF ADMISSION:  05/23/2014 DATE OF DISCHARGE:  05/31/2014                              DISCHARGE SUMMARY   DISCHARGE DIAGNOSES: 1. Functional deficit secondary to left lateral medullary infarct     secondary to posterior circulation large vessel atherosclerosis. 2. Subcutaneous Lovenox for deep vein thrombosis prophylaxis. 3. Hyperlipidemia. 4. Right vertebral and basilar artery stenosis. 5. Persistent hiccups.  HISTORY OF PRESENT ILLNESS:  This is a 64 year old right-handed male with history of hyperlipidemia, independent prior to admission, living with his wife, working for [employer].  Admitted May 20, 2014, with dizziness, facial weakness, and headache.  MRI of the head showed acute infarct, left posterolateral medulla.  MRA of the head with occluded left vertebral artery as well as free occlusive stenosis of the right vertebrobasilar junction and possibly the proximal basilar artery. Echocardiogram with ejection fraction 65% and grade 1 diastolic dysfunction.  CT angiogram of the head and neck again showed right vertebrobasilar junction and proximal basilar artery stenosis. Attempted stenting by Interventional Radiology was unsuccessful.  There were considerations being made for possible re-attempted stenting in the next few weeks.  Neurology followup maintained on aspirin and Plavix for CVA prophylaxis x3 months of dual antiplatelets.  Subcutaneous Lovenox for DVT prophylaxis.  Physical and occupational therapy ongoing.  The patient was admitted for a comprehensive rehab program.  PAST MEDICAL HISTORY:  See discharge diagnoses.  SOCIAL HISTORY:  Lives with wife.  FUNCTIONAL HISTORY:  Prior to admission, independent.  FUNCTIONAL STATUS:  Upon  admission to rehab services was +2, ambulate 10 feet, rolling walker.  Moderate assist, stand pivot transfers.  Min to mod assist, activities of daily living.  PHYSICAL EXAMINATION:  VITAL SIGNS:  Blood pressure 132/76, pulse 62, temperature 98, respirations 18. GENERAL:  This is an alert male, oriented x3. LUNGS:  Clear to auscultation. CARDIAC:  Regular rate and rhythm. ABDOMEN:  Soft, nontender.  Good bowel sounds.  No evidence of ataxia of finger-to-nose testing.  Moderate ataxia, left finger-to-nose testing.  REHABILITATION HOSPITAL COURSE:  The patient was admitted to inpatient rehab services with therapies initiated on a 3-hour daily basis consisting of physical therapy, occupational therapy, and rehabilitation nursing.  The following issues were addressed during the patient's rehabilitation stay.  Pertaining to Mr. Monks's left lateral medullary infarct, remained stable, maintained on aspirin and Plavix therapy.  He would follow up with Neurology Services.  Subcutaneous Lovenox for DVT prophylaxis, no bleeding episodes.  During workup of CVA, he noted right vertebral and basilar artery stenosis.  The patient would follow up Neurology Services in regard to possible reattempts at stenting which had initially failed in the near future.  Persistent hiccups, the patient on max doses of baclofen for hiccups as well as Reglan with slow but steady improvement.  The patient received weekly collaborative interdisciplinary team conferences to discuss estimated length of stay, family teaching, and any barriers to discharge.  He was ambulating 150 feet, close supervision.  He could ambulate to the  sink to wash his hands with close supervision, ambulate to the bathroom and be able to stand to urinate.  Sessions focused on weight shifting, midline orientation, dynamic balance.  Occupational therapy, activities of daily living, demonstrates occasional loss of balance to the left.   He continues to exhibit some compensatory strategies with head and trunk tilt to the right as well as rounded shoulders.  He was able to gather the belongings for dressing and grooming.  Full family teaching was completed.  Plan, discharge to home with ongoing therapies dictated per rehab services.  DISCHARGE MEDICATIONS:  Included; 1. Aspirin 81 mg p.o. daily. 2. Lipitor 20 mg p.o. daily. 3. Baclofen 20 mg p.o. t.i.d. 4. Plavix 75 mg p.o. daily. 5. Pepcid 20 mg p.o. b.i.d. 6. Reglan 5 mg p.o. t.i.d.  DIET:  Regular.  SPECIAL INSTRUCTIONS:  The patient should follow up with Dr. Alysia Penna at the [facility] for July 12, 2014; Dr. Erlinda Hong, Neurology Service in 1 month, call for appointment; Dr. Elsie Stain, medical management.  The patient to be followed up with Neurology Services, would discuss possible re-attempts at stenting for basilar artery stenosis as an outpatient.     Lauraine Rinne, P.A.   ______________________________ [name], M.D.    DA/MEDQ  D:  05/30/2014  T:  05/30/2014  Job:  [id number]  cc:   Dr. [place]. Damita Dunnings, M.D.

## 2014-05-31 NOTE — Progress Notes (Signed)
Subjective/Complaints: No new issues overnite Review of Systems - Negative except occ small emesis, nausea, no abd pain  Objective: Vital Signs: Blood pressure 139/83, pulse 63, temperature 98 F (36.7 C), temperature source Oral, resp. rate 18, height 5\' 10"  (1.778 m), weight 88.225 kg (194 lb 8 oz), SpO2 97 %. Dg Shoulder 1v Left  05/29/2014   CLINICAL DATA:  M25.512 (ICD-10-CM) - Nontraumatic shoulder pain, leftPain is anterior to shoulder joint and is painful to lift arm Symptoms for several weeksPatient says he does lots of lifting at workPatient states he had a stroke and that's why he was addmited to [facility].Hx of surgery in Lumbar 2 years ago  EXAM: LEFT SHOULDER - 1 VIEW  COMPARISON:  None.  FINDINGS: No fracture. No bone lesion. Glenohumeral and AC joints are normally aligned. There are mild AC joint osteoarthritic changes.  Subacromial space is narrowed to 5 mm.  Soft tissues are unremarkable.  IMPRESSION: No fracture or dislocation. Mild AC joint osteoarthritis. Narrowing of the subacromial space to 5 mm suggesting an underlying rotator cuff tendon tear. Consider follow-up shoulder MRI.   Electronically Signed   By: Lajean Manes M.D.   On: 05/29/2014 09:50   No results found for this or any previous visit (from the past 72 hour(s)).   HEENT: normal Cardio: RRR and no murmur Resp: CTA B/L and unlabored GI: BS positive and NT, ND Extremity:  Pulses positive and No Edema Skin:   Intact Neuro: Alert/Oriented, Cranial Nerve II-XII normal, Abnormal Sensory reduced sensation on right side, Abnormal Motor decreased R foot toe flex/ext, Abnormal FMC Ataxic/ dec FMC and no left lateral pulsion in sitting Musc/Skel:  Left shoulder Pos Neers sign, full ROM, Neg drop arm test, pain ant AC area Gen NAD, continued hiccups   Assessment/Plan: 1. 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  Stable for D/C today F/u PCP in 1-2 weeks F/u PM&R 3  weeks See D/C summary See D/C instructions FIM: FIM - Bathing Bathing Steps Patient Completed: Chest, Right Arm, Left upper leg, Right lower leg (including foot), Left Arm, Abdomen, Front perineal area, Buttocks, Right upper leg, Left lower leg (including foot) Bathing: 5: Supervision: Safety issues/verbal cues  FIM - Upper Body Dressing/Undressing Upper body dressing/undressing steps patient completed: Thread/unthread right sleeve of pullover shirt/dresss, Thread/unthread left sleeve of pullover shirt/dress, Put head through opening of pull over shirt/dress, Pull shirt over trunk Upper body dressing/undressing: 6: More than reasonable amount of time FIM - Lower Body Dressing/Undressing Lower body dressing/undressing steps patient completed: Thread/unthread right underwear leg, Thread/unthread left underwear leg, Pull underwear up/down, Thread/unthread right pants leg, Thread/unthread left pants leg, Pull pants up/down, Don/Doff right sock, Don/Doff left sock, Don/Doff right shoe, Don/Doff left shoe, Fasten/unfasten right shoe, Fasten/unfasten left shoe Lower body dressing/undressing: 5: Supervision: Safety issues/verbal cues  FIM - Toileting Toileting steps completed by patient: Adjust clothing after toileting, Performs perineal hygiene, Adjust clothing prior to toileting Toileting Assistive Devices: Grab bar or rail for support Toileting: 5: Supervision: Safety issues/verbal cues  FIM - Radio producer Devices: Elevated toilet seat Toilet Transfers: 5-To toilet/BSC: Supervision (verbal cues/safety issues), 5-From toilet/BSC: Supervision (verbal cues/safety issues)  FIM - IT sales professional Transfer: 5: Bed > Chair or W/C: Supervision (verbal cues/safety issues), 5: Chair or W/C > Bed: Supervision (verbal cues/safety issues)  FIM - Locomotion: Wheelchair Locomotion: Wheelchair: 0: Activity did not occur FIM - Locomotion: Ambulation Locomotion:  Ambulation Assistive Devices: Journalist, newspaper Ambulation/Gait  Assistance: 5: Supervision Locomotion: Ambulation: 5: Travels 150 ft or more with supervision/safety issues  Comprehension Comprehension Mode: Auditory Comprehension: 7-Follows complex conversation/direction: With no assist  Expression Expression Mode: Verbal Expression: 7-Expresses complex ideas: With no assist  Social Interaction Social Interaction: 7-Interacts appropriately with others - No medications needed.  Problem Solving Problem Solving Mode: Not assessed Problem Solving: 6-Solves complex problems: With extra time  Memory Memory Mode: Not assessed Memory: 7-Complete Independence: No helper    Medical Problem List and Plan: 1. 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 2. DVT Prophylaxis/Anticoagulation: Subcutaneous Lovenox. Monitor platelet counts or any signs of bleeding 3. Pain Management: Tylenol as needed, hemifacial pain trigeminal distribution 4. Hyperlipidemia. Lipitor 5. Neuropsych: This patient is capable of making decisions on his own behalf. 6. Skin/Wound Care: Routine skin checks 7. Fluids/Electrolytes/Nutrition: Strict I&O with follow-up chemistries 8. Right vertebral and basilar artery stenosis. Initial attempts at stenting unsuccessful. Plan to discuss reattempts at stenting in the next few weeks with interventional radiology 10. Persistent hiccups---central origin---  -lower dose baclofen with occ hiccups  -D/C reglan, no reflux 11. Shoulder pain ,AC joint arthropathy and poss rotator cuff LOS (Days) 8 A FACE TO FACE EVALUATION WAS PERFORMED  Yolanda Dockendorf E 05/31/2014, 6:33 AM

## 2014-05-31 NOTE — Progress Notes (Signed)
Physical Therapy Discharge Summary  Patient Details  Name: Alejandro Schneider MRN: [id number] Date of Birth: 07-01-1950  Today's Date: 05/31/2014 PT Individual Time: 0810-0830 PT Individual Time Calculation (min): 20 min    Patient has met 10 of 10 long term goals due to improved activity tolerance, improved balance, improved postural control, ability to compensate for deficits and improved awareness.  Patient to discharge at an ambulatory level Supervision. Patient's care partner is independent to provide the necessary physical assistance at discharge.  Reasons goals not met: NA  Recommendation:  Patient will benefit from ongoing skilled PT services in outpatient setting to continue to advance safe functional mobility, address ongoing impairments in vestibular symptoms, midline orientation, dynamic balance, balance strategies, and minimize fall risk.  Equipment: No equipment provided  Reasons for discharge: treatment goals met and discharge from hospital  Patient/family agrees with progress made and goals achieved: Yes  PT Discharge Patient seen for family training with wife. Patient at overall supervision level with community distance ambulation. Wife able to provide safe and appropriate supervision and verbal cues for midline positioning. Educated on use of SPC in [place] as needed to facilitate weight shift to R/improved balance. Pt and wife instructed in HEP for static balance exercises in corner of kitchen with counters for UE support for safety. Patient's Berg Balance Scale improved to 43/56 from 27/56 on evaluation. See below for further discharge details.   Precautions/Restrictions Precautions Precautions: Fall Precaution Comments: leans to left Restrictions Weight Bearing Restrictions: No Pain Pain Assessment Pain Assessment: No/denies pain Pain Score: 0-No pain Vision/Perception   Defer to OT discharge summary Cognition Overall Cognitive Status: Within Functional Limits  for tasks assessed Orientation Level: Oriented X4 Attention: Selective Selective Attention: Appears intact Memory: Appears intact Awareness: Appears intact Problem Solving: Appears intact Safety/Judgment: Appears intact Sensation Sensation Light Touch: Impaired Detail Light Touch Impaired Details: Impaired LUE (Pt with reports of slight decreased acuity in the left fingertips.) Hot/Cold: Appears Intact Proprioception: Appears Intact Coordination Gross Motor Movements are Fluid and Coordinated: Yes Fine Motor Movements are Fluid and Coordinated: Yes (very minimal dysmetria noted in the LUE when compared to the right.  Does not affect functional abilities with selfcare tasks.  ) Motor  Motor Motor: Abnormal postural alignment and control Motor - Discharge Observations: Lateral lean to left in standing, impaired midline orientation, head tilt to the right   Mobility Bed Mobility Bed Mobility: Supine to Sit;Sit to Supine Supine to Sit: 7: Independent;HOB flat Sit to Supine: 7: Independent;HOB flat Transfers Transfers: Yes Stand Pivot Transfers: 7: Independent Locomotion  Ambulation Ambulation: Yes Ambulation/Gait Assistance: 5: Supervision Ambulation Distance (Feet): 150 Feet Assistive device: None Ambulation/Gait Assistance Details: Verbal cues for precautions/safety Gait Gait: Yes Gait Pattern: Impaired Gait Pattern: Step-through pattern;Decreased step length - left;Decreased weight shift to right;Decreased stance time - right;Lateral trunk lean to left;Narrow base of support Gait velocity: decreased Stairs / Additional Locomotion Stairs: Yes Stairs Assistance: 6: Modified independent (Device/Increase time) Stair Management Technique: One rail Right;Alternating pattern;Forwards Number of Stairs: 12 Height of Stairs: 6 Ramp: Not tested (comment) Curb: 5: Supervision Wheelchair Mobility Wheelchair Mobility: No (pt ambulatory)  Trunk/Postural Assessment  Cervical  Assessment Cervical Assessment: Exceptions to [facility] Cervical Strength Overall Cervical Strength Comments: Pt with full AROM but tends to laterally flexes/rotates right during mobility Thoracic Assessment Thoracic Assessment: Within Functional Limits Lumbar Assessment Lumbar Assessment: Within Functional Limits Postural Control Postural Control: Deficits on evaluation Protective Responses: Pt with delayed balance reactions in standing, increased lean and LOB  to the left, decreased weightbearing/weight shift to R  Balance Balance Balance Assessed: Yes Standardized Balance Assessment Standardized Balance Assessment: Berg Balance Test Berg Balance Test Sit to Stand: Able to stand without using hands and stabilize independently Standing Unsupported: Able to stand 2 minutes with supervision Sitting with Back Unsupported but Feet Supported on Floor or Stool: Able to sit safely and securely 2 minutes Stand to Sit: Sits safely with minimal use of hands Transfers: Able to transfer safely, minor use of hands Standing Unsupported with Eyes Closed: Able to stand 10 seconds with supervision Standing Ubsupported with Feet Together: Able to place feet together independently but unable to hold for 30 seconds From Standing, Reach Forward with Outstretched Arm: Can reach confidently >25 cm (10") From Standing Position, Pick up Object from Floor: Able to pick up shoe safely and easily From Standing Position, Turn to Look Behind Over each Shoulder: Looks behind from both sides and weight shifts well Turn 360 Degrees: Able to turn 360 degrees safely but slowly Standing Unsupported, Alternately Place Feet on Step/Stool: Able to stand independently and complete 8 steps >20 seconds Standing Unsupported, One Foot in Front: Loses balance while stepping or standing Standing on One Leg: Able to lift leg independently and hold equal to or more than 3 seconds Total Score: 43 Dynamic Standing Balance Dynamic Standing  - Balance Support: During functional activity;No upper extremity supported Dynamic Standing - Level of Assistance: 5: Stand by assistance Dynamic Standing - Comments: LOB/lean to left in standing without UE support Extremity Assessment  RUE Assessment RUE Assessment: Within Functional Limits LUE Assessment LUE Assessment: Exceptions to [facility] LUE Strength LUE Overall Strength Comments: Pt with full AROM WFLS for all joints.  Strength 3+/5 in the shoulder but 4/5 for all other joints.  RLE Assessment RLE Assessment: Within Functional Limits LLE Assessment LLE Assessment: Within Functional Limits  See FIM for current functional status  Laretta Alstrom 05/31/2014, 12:39 PM

## 2014-05-31 NOTE — Progress Notes (Signed)
Social Work Discharge Note Discharge Note  The overall goal for the admission was met for:   Discharge location: Yes-HOME WITH WIFE WHO WILL BE THERE FOR A SHORT TIME WITH PT  Length of Stay: Yes-8 DAYS  Discharge activity level: Yes-SUPERVISION LEVEL  Home/community participation: Yes  Services provided included: MD, RD, PT, OT, SLP, RN, CM, TR, Pharmacy and SW  Financial Services: Private Insurance: [facility]  Follow-up services arranged: Outpatient: [facility] 3/8 8;30-9;30 AM  Comments (or additional information):WIFE HAS BEEN HERE TO ATTEND THERAPIES WITH PT AND BOTH FEEL READY FOR DISCHARGE  Patient/Family verbalized understanding of follow-up arrangements: Yes  Individual responsible for coordination of the follow-up plan: SELF & SUSAN-WIFE  Confirmed correct DME delivered: Elease Hashimoto 05/31/2014    Elease Hashimoto

## 2014-05-31 NOTE — Progress Notes (Signed)
Occupational Therapy Discharge Summary  Patient Details  Name: Alejandro Schneider MRN: [id number] Date of Birth: 07-Dec-1950     Patient has met 10 of 10 long term goals due to improved balance, postural control, ability to compensate for deficits, functional use of  LEFT lower extremity and improved coordination.  Patient to discharge at overall Supervision level.  Patient's care partner is independent to provide the necessary physical assistance at discharge.    Reasons goals not met: NA  Recommendation:  Patient will benefit from ongoing skilled OT services in outpatient setting to continue to advance functional skills in the area of BADL, iADL and Vocation.  He continues to still demonstrate decreased dynamic standing balance needed for selfcare tasks, IADLs, or for return to work.  In addition, nystagmus remains constant impacting his visual acuity as well as his ability to focus.  Recommend initial 24 hour supervision for all transfers and functional mobility.  Equipment: No equipment provided  Reasons for discharge: treatment goals met and discharge from hospital  Patient/family agrees with progress made and goals achieved: Yes  OT Discharge Precautions/Restrictions  Precautions Precautions: Fall Precaution Comments: leans to left Restrictions Weight Bearing Restrictions: No  Pain  No report of pain  ADL  See FIM scale for details  Vision/Perception  Vision- History Baseline Vision/History: Wears glasses Wears Glasses: Reading only Patient Visual Report: Blurring of vision Vision- Assessment Vision Assessment?: Yes Eye Alignment: Within Functional Limits Ocular Range of Motion: Within Functional Limits Alignment/Gaze Preference: Within Defined Limits Tracking/Visual Pursuits: Able to track stimulus in all quads without difficulty Saccades: Within functional limits Convergence: Within functional limits Visual Fields: No apparent deficits Additional Comments: Pt  still with constant visual nystagmus bilaterally in both the horizontal and vertical plane.  He only reports relief with initial turning of the head or eyes to a new target, then it will stop for 1 second before resuming.   Cognition Overall Cognitive Status: Within Functional Limits for tasks assessed Arousal/Alertness: Awake/alert Orientation Level: Oriented X4 Attention: Selective Selective Attention: Appears intact Memory: Appears intact Awareness: Appears intact Problem Solving: Appears intact Safety/Judgment: Appears intact Sensation Sensation Light Touch: Impaired Detail Light Touch Impaired Details: Impaired LUE (Pt still reporting slight numbness in the finger tips of his left hand ) Stereognosis: Appears Intact Hot/Cold: Impaired Detail Hot/Cold Impaired Details: Impaired RUE (Pt with absent hot cold discrimination in the arm, hand, and right side of his trunk) Proprioception: Appears Intact Additional Comments: Pt with decreased pain perception in the RUE as well as the right side of his torso. Coordination Gross Motor Movements are Fluid and Coordinated: Yes Fine Motor Movements are Fluid and Coordinated: Yes Motor  Motor Motor: Abnormal postural alignment and control Motor - Discharge Observations: Lateral lean to left in standing, impaired midline orientation, head tilt to the right  Mobility  Bed Mobility Bed Mobility: Supine to Sit;Sit to Supine Supine to Sit: 7: Independent;HOB flat Sit to Supine: 7: Independent;HOB flat Transfers Transfers: Sit to Stand;Stand to Sit Sit to Stand: 6: Modified independent (Device/Increase time);With upper extremity assist Stand to Sit: 6: Modified independent (Device/Increase time);With upper extremity assist  Trunk/Postural Assessment  Cervical Assessment Cervical Assessment: Exceptions to [facility] Cervical Strength Overall Cervical Strength Comments: Pt maintains slight forward and right lateral flexion with dynamic  activity. Thoracic Assessment Thoracic Assessment: Within Functional Limits Lumbar Assessment Lumbar Assessment: Within Functional Limits Postural Control Postural Control: Deficits on evaluation Protective Responses: Pt with delayed balance responses and stepping strategies with LOB to the left  side however, much improved from evaluation.  Balance Balance Balance Assessed: Yes Dynamic Sitting Balance Dynamic Sitting - Balance Support: No upper extremity supported Dynamic Sitting - Level of Assistance: 7: Independent Static Standing Balance Static Standing - Balance Support: No upper extremity supported Static Standing - Level of Assistance: 6: Modified independent (Device/Increase time) Dynamic Standing Balance Dynamic Standing - Balance Support: No upper extremity supported Dynamic Standing - Level of Assistance: 5: Stand by assistance Dynamic Standing - Balance Activities: Forward lean/weight shifting Extremity/Trunk Assessment RUE Assessment RUE Assessment: Within Functional Limits LUE Assessment LUE Assessment: Exceptions to [facility] LUE Strength LUE Overall Strength Comments: Pt with full AROM WFLS for all joints.  Strength 3+/5 in the shoulder but 4/5 for all other joints.  Pt with new diagnosis of rotator cuff tear.  See FIM for current functional status  Deadra Diggins OTR/L 05/31/2014, 5:08 PM

## 2014-06-10 ENCOUNTER — Encounter: Payer: Self-pay | Admitting: Family Medicine

## 2014-06-10 ENCOUNTER — Ambulatory Visit (INDEPENDENT_AMBULATORY_CARE_PROVIDER_SITE_OTHER): Payer: 59 | Admitting: Family Medicine

## 2014-06-10 VITALS — BP 126/80 | HR 73 | Temp 98.2°F | Wt 199.0 lb

## 2014-06-10 DIAGNOSIS — I63012 Cerebral infarction due to thrombosis of left vertebral artery: Secondary | ICD-10-CM

## 2014-06-10 NOTE — Progress Notes (Signed)
Pre visit review using our clinic review tool, if applicable. No additional management support is needed unless otherwise documented below in the visit note.  To recap, was leaning to the L, gait was abnormal, to fire department and then to hospital.  Dx CVA, seen by stroke service.   MRA with  LEFT vertebral artery occlusion. In addition, thready irregular distal RIGHT vertebral artery and high-grade stenosis of the proximal basilar artery. 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  He made sig progress as inpatient, able to be discharged home.  Now home with wife, on short term disability for now.  Still with L finger/hand numbness, R side of body with dec temp sensation, and L face still slightly numb.  No falls.  Swallowing well.  No other new sx in the meantime.  Has been started on statin, with possible aches from that.  D/w pt at OV.  Some constipation, taking mirlax and has used dulcolax.  Has PT arranged.  He can tell that he has made some progress from admission to now.   PMH and SH reviewed  ROS: See HPI, otherwise noncontributory.  Meds, vitals, and allergies reviewed.   GEN: nad, alert and oriented HEENT: mucous membranes moist NECK: supple w/o LA CV: rrr. PULM: ctab, no inc wob ABD: soft, +bs EXT: no edema SKIN: no acute rash CN 2-12 wnl B, S/S/DTR wnl x4 except for dec cold sensation on R side of body, dec sensation to light touch on the L hand and face, slightly more difficulty with finger to nose on L vs R hand.

## 2014-06-10 NOTE — Patient Instructions (Signed)
If you have any new symptoms, then go to the ER.   Update me in about 1 week about the muscle aches.  Take care.  Glad to see you.

## 2014-06-11 ENCOUNTER — Ambulatory Visit: Payer: 59 | Attending: Physical Medicine & Rehabilitation | Admitting: Rehabilitative and Restorative Service Providers"

## 2014-06-11 DIAGNOSIS — R2689 Other abnormalities of gait and mobility: Secondary | ICD-10-CM | POA: Insufficient documentation

## 2014-06-11 DIAGNOSIS — R269 Unspecified abnormalities of gait and mobility: Secondary | ICD-10-CM | POA: Insufficient documentation

## 2014-06-11 DIAGNOSIS — H8149 Vertigo of central origin, unspecified ear: Secondary | ICD-10-CM | POA: Diagnosis not present

## 2014-06-11 NOTE — Patient Instructions (Signed)
Feet Together, Varied Arm Positions - Eyes Open   With eyes open, feet together, arms by your side, look straight ahead at a stationary object. Hold __30__ seconds. Repeat __3__ times per session. Do __2__ sessions per day.  Copyright  VHI. All rights reserved.  Feet Partial Heel-Toe, Varied Arm Positions - Eyes Open   With eyes open, right foot partially in front of the other, arms at your side, look straight ahead at a stationary object. Hold _30___ seconds. Repeat __3__ times each leg forward per session. Do __2__ sessions per day.  Copyright  VHI. All rights reserved.

## 2014-06-11 NOTE — Therapy (Signed)
[facility] [address] [place], [place], [address] Phone: [phone]   Fax:  [phone]  Physical Therapy Evaluation  Patient Details  Name: Alejandro Schneider MRN: [id number] Date of Birth: Oct 25, 1950 Referring Provider:  Dr. Alysia Penna  Encounter Date: 06/11/2014      PT End of Session - 06/11/14 1433    Visit Number 1   Number of Visits 16   Date for PT Re-Evaluation 08/11/14   PT Start Time 0855   PT Stop Time 0935   PT Time Calculation (min) 40 min   Activity Tolerance Patient tolerated treatment well   Behavior During Therapy [facility] for tasks assessed/performed      Past Medical History  Diagnosis Date  . Hyperlipidemia     Past Surgical History  Procedure Laterality Date  . Tonsillectomy  1972  . Inguinal hernia repair  10/16/02    Right  Dr Hassell Done  . Colonoscopy  2006  . Lumbar laminectomy/decompression microdiscectomy Right 05/30/2012    Procedure: LUMBAR LAMINECTOMY/DECOMPRESSION MICRODISCECTOMY 1 LEVEL;  Surgeon: Faythe Ghee, MD;  [facility];  Service: Neurosurgery;  Laterality: Right;  . Radiology with anesthesia N/A 05/21/2014    Procedure: ANGIOPLASTY POSSIBLE STENT;  Surgeon: Rob Hickman, MD;  Location: [facility];  Service: Radiology;  Laterality: N/A;    There were no vitals taken for this visit.  Visit Diagnosis:  Abnormality of gait  Central vestibular vertigo, unspecified laterality      Subjective Assessment - 06/11/14 0857    Symptoms The patient is s/p  CVA 05/20/2014 and was admitted to acute, then IP rehab with d/c on 05/31/2014.  The patient's cc: dizziness and imbalance.  He is using [facility] for household and in the community.   Patient Stated Goals Get back to where I can be independent.   Currently in Pain? No/denies          [facility] PT Assessment - 06/11/14 0900    Assessment   Medical Diagnosis --  L lateral medullary infarct   Onset Date --   05/20/2014   Next MD Visit --  07/12/2014   Prior Therapy --  acute and IP rehab   Precautions   Precautions Fall   Balance Screen   Has the patient fallen in the past 6 months No   Has the patient had a decrease in activity level because of a fear of falling?  No   Is the patient reluctant to leave their home because of a fear of falling?  No  cautious and using device   Home Environment   Living Enviornment Private residence   Living Arrangements Spouse/significant other   Type of [place] to enter;Ramped entrance   Entrance Stairs-Number of Steps --  2   Entrance Stairs-Rails Can reach both   [place] Two level;Able to live on main level with bedroom/bathroom   [place] - single point   Prior Function   Level of Independence Independent with homemaking with ambulation  independent with all activities + work   Vocation Full time employment   Vocation Requirements --  Lineman for [employer]   Bed Mobility Supine to Sit   Supine to Sit 7: Independent   Sit to Supine 7: Independent   Transfers   Transfers Sit to Stand   Transfers Yes   Sit to Stand 7: Independent   Stand to Sit 7: Independent  Ambulation/Gait   Ambulation/Gait Yes   Ambulation/Gait Assistance 6: Modified independent (Device/Increase time)   Ambulation Distance (Feet) --  200   Assistive device Straight cane   Gait Pattern --  wide base of support   Ambulation Surface Level   Gait velocity 2.57 ft/sec   Stairs Yes   Stairs Assistance 6: Modified independent (Device/Increase time)   Stair Management Technique Alternating pattern;One rail Left   Number of Stairs --  4   Standardized Balance Assessment   Standardized Balance Assessment Berg Balance Test   Berg Balance Test   Sit to Stand Able to stand without using hands and stabilize independently   Standing Unsupported Able to stand safely 2 minutes   Sitting with Back Unsupported but Feet  Supported on Floor or Stool Able to sit safely and securely 2 minutes   Stand to Sit Sits safely with minimal use of hands   Transfers Able to transfer safely, minor use of hands   Standing Unsupported with Eyes Closed Able to stand 10 seconds safely   Standing Ubsupported with Feet Together Needs help to attain position and unable to hold for 15 seconds   From Standing, Reach Forward with Outstretched Arm Can reach forward >12 cm safely (5")   From Standing Position, Pick up Object from Floor Able to pick up shoe safely and easily   From Standing Position, Turn to Look Behind Over each Shoulder Needs supervision when turning   Turn 360 Degrees Able to turn 360 degrees safely but slowly   Standing Unsupported, Alternately Place Feet on Step/Stool Able to complete >2 steps/needs minimal assist   Standing Unsupported, One Foot in Front Able to take small step independently and hold 30 seconds   Standing on One Leg Unable to try or needs assist to prevent fall   Total Score 37            Vestibular Assessment - 06/11/14 0907    Symptom Behavior   Type of Dizziness --  "Eyes won't settle down"   Frequency of Dizziness --  Dizzy when out of bed/sitting up   Duration of Dizziness --  constant 5/10 sensation   Aggravating Factors Activity in general   Relieving Factors Lying supine   Occulomotor Exam   Occulomotor Alignment Normal  in hospital, pt reports had dbl vision that is resolved   Spontaneous Absent   Gaze-induced Absent   Smooth Pursuits --  L eye with minimal saccadic movements and decreased focus   Saccades Dysmetria  overshoot with return to midline   Comment --  No increase in dizziness   Vestibulo-Occular Reflex   VOR 1 Head Only (x 1 viewing) --  slow gaze, pt able to maintain focus   Comment Patient maintains head tilted to the R side   Positional Sensitivities   Head Turning x 5 --  continues at 5/10 baseline dizziness   Head Nodding x 5 --  continues at  baseline 5/10                      PT Education - 06/11/14 1432    Education provided Yes   Education Details HEP: feet together with eyes open, partial heel to toe balance in corner.   Person(s) Educated Patient;Spouse   Methods Explanation;Demonstration;Handout   Comprehension Returned demonstration;Verbalized understanding          PT Short Term Goals - 06/12/14 1415    PT SHORT TERM GOAL #1   Title The  patient will be indep with HEP for balance, dizziness and general mobility.  Target date 07/12/2014   Time 4   Period Weeks   PT SHORT TERM GOAL #2   Title The patient will improve Berg score from 37/56 up to 44/56 to demo decreasing risk for falls. Target date 07/12/2014   Time 4   Period Weeks   PT SHORT TERM GOAL #3   Title The patient will improve gait speed from 2.57 ft/sec up to 3.0 ft/sec to demo improving functional ambulation. Target date 07/12/2014   Time 4   Period Weeks   PT SHORT TERM GOAL #4   Title The aptient will negotiate 4 steps without handrail with step to pattern modified indep.  Target date 07/12/2014.   Time 4   Period Weeks   PT SHORT TERM GOAL #5   Title The patient will report baseline dizziness < or equal to 3/10 (currently 5/10). Target date 07/12/2014.   Time 4   Period Weeks           PT Long Term Goals - 06/12/14 1417    PT LONG TERM GOAL #1   Title The patient will verbalize CVA warning signs/risk factors.  Target date 08/11/2014   Time 8   Period Weeks   PT LONG TERM GOAL #2   Title The patient will improve Berg score from 37/56 up to 48/56 to demo decreased risk for falls.  Target date 08/11/2014.   Time 8   Period Weeks   PT LONG TERM GOAL #3   Title The patient will improve gait speed from 2.57 ft/sec up to > or equal to 3.4 ft/sec to demo improving functional ambulation.  Target date 08/11/2014.   Time 8   Period Weeks   PT LONG TERM GOAL #4   Title The patient will negotiate 4 steps without a handrail with reciprocal  pattern independently.  Target date 08/11/2014   Time 8   Period Weeks   PT LONG TERM GOAL #5   Title The patient will negotiate indoor and outdoor surfaces without a device independently to demo return to full community mobility.  Target date 08/11/2014   Time 8   Period Weeks               Plan - 06/12/14 1421    Clinical Impression Statement The patient is a 64 yo male s/p CVA with admission to IP rehab and then d/c home.  He presents today with wide base of support ambulation, dizziness and decline in prior functional abilities.     Pt will benefit from skilled therapeutic intervention in order to improve on the following deficits Decreased activity tolerance;Abnormal gait;Decreased balance;Decreased mobility;Impaired vision/preception;Postural dysfunction;Difficulty walking   Rehab Potential Good   PT Frequency 2x / week   PT Duration 8 weeks   PT Treatment/Interventions Therapeutic activities;Patient/family education;Therapeutic exercise;Balance training;Gait training;Stair training;Neuromuscular re-education;Functional mobility training   PT Next Visit Plan Check HEP, add more balance HEP bring feet to more narrow base of support, head turns to tolerance, and dynamic gait activities.   Consulted and Agree with Plan of Care Patient         Problem List Patient Active Problem List   Diagnosis Date Noted  . 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 Essential hypertension   . CVA (cerebral infarction) 05/23/2014  . Basilar artery stenosis   . 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 Vertigo   . HLD (hyperlipidemia)   .  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 Intractable hiccups   . Stroke 05/20/2014  . Insufficiency of posterior brain circulation   . Rash and nonspecific skin eruption 04/10/2014  . Seborrheic keratoses 01/29/2014  . Skin irritation 09/06/2013  . Pain in joint, shoulder region 09/06/2013  . Nevus  08/26/2011  . FH: prostate cancer 08/26/2011  . Routine general medical examination at a health care facility 08/12/2010  . HYPERCHOLESTEROLEMIA 07/11/2007   Thank you for the referral of this patient.   [place], [place] 06/12/2014, 2:23 PM  [facility] [address] [place], [place], [address] Phone: [phone]   Fax:  [phone]

## 2014-06-12 ENCOUNTER — Encounter: Payer: Self-pay | Admitting: Family Medicine

## 2014-06-12 NOTE — Assessment & Plan Note (Addendum)
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  Goal for as much recovery as possible with secondary prevention. D/w pt.  On statin. He'll see if he can tolerate, if aches continue then he'll notify me. Continue PT.  Has f/u with neuro pending. Out of work for now.  On ASA and plavix for now.  If any new sx, to ER.  He agrees.   >25 minutes spent in face to face time with patient, >50% spent in counselling or coordination of care.

## 2014-06-17 ENCOUNTER — Encounter: Payer: Self-pay | Admitting: Family Medicine

## 2014-06-17 ENCOUNTER — Other Ambulatory Visit: Payer: Self-pay | Admitting: Family Medicine

## 2014-06-17 MED ORDER — SIMVASTATIN 20 MG PO TABS: 20.0000 mg | ORAL_TABLET | Freq: Every day | ORAL | Status: DC

## 2014-06-19 ENCOUNTER — Ambulatory Visit: Payer: 59 | Admitting: Rehabilitative and Restorative Service Providers"

## 2014-06-19 DIAGNOSIS — H8149 Vertigo of central origin, unspecified ear: Secondary | ICD-10-CM

## 2014-06-19 DIAGNOSIS — R269 Unspecified abnormalities of gait and mobility: Secondary | ICD-10-CM | POA: Diagnosis not present

## 2014-06-19 NOTE — Therapy (Signed)
[facility] [address] [place], [place], [address] Phone: [phone]   Fax:  [phone]  Physical Therapy Treatment  Patient Details  Name: Alejandro Schneider MRN: [id number] Date of Birth: 09-16-1950 Referring Provider:  Joaquim Nam, MD  Encounter Date: 06/19/2014      PT End of Session - 06/19/14 1134    Visit Number 2   Number of Visits 16   Date for PT Re-Evaluation 08/11/14   PT Start Time 0803   PT Stop Time 0845   PT Time Calculation (min) 42 min   Activity Tolerance Patient tolerated treatment well   Behavior During Therapy [facility] for tasks assessed/performed      Past Medical History  Diagnosis Date  . Hyperlipidemia   . CVA (cerebral infarction)     Past Surgical History  Procedure Laterality Date  . Tonsillectomy  1972  . Inguinal hernia repair  10/16/02    Right  Dr Daphine Deutscher  . Colonoscopy  2006  . Lumbar laminectomy/decompression microdiscectomy Right 05/30/2012    Procedure: LUMBAR LAMINECTOMY/DECOMPRESSION MICRODISCECTOMY 1 LEVEL;  Surgeon: Reinaldo Meeker, MD;  Location: [facility];  Service: Neurosurgery;  Laterality: Right;  . Radiology with anesthesia N/A 05/21/2014    Procedure: ANGIOPLASTY POSSIBLE STENT;  Surgeon: Oneal Grout, MD;  [facility]: [facility];  Service: Radiology;  Laterality: N/A;    There were no vitals filed for this visit.  Visit Diagnosis:  Abnormality of gait  Central vestibular vertigo, unspecified laterality      Subjective Assessment - 06/19/14 0806    Symptoms The patient reports morning is worst time of day for balance.  Mobility improves throughout day.   Currently in Pain? No/denies      NEUROMUSCULAR RE-EDUCATION: Sit>R sidelying provokes 3/10 dizziness and return to sitting provokes 6/10.  Sit>L sidelying is milder in intensity. Repeated 3 times, with mild upbeat, rotary nystagmus noted only to the right side. Corner balance exercises including  partial tandem with head turns, feet together with eyes open and eyes closed Standing on incline (in all directions) with head turns, proactive balance with UE reaching, marching with min A. R leg stance moving L foot onto/off of 6" step and then rolling a ball under L foot   Gait: Dynamic gait with direction changes anteriorly/posteriorly with CGA to min A 180 degree turns with min A Gait with eyes closed and min A x 20 ft x 4 reps with left veering Heel walking with min A x 15 feet x 3 reps Toe walking with min A x 15 ft x 3 reps Figure 8 walking with min A        PT Education - 06/19/14 0845    Education provided Yes   Education Details HEP: 360 degree turns, marching, and habituation sit<>bilateral sidelying   Person(s) Educated Patient   Methods Explanation;Demonstration;Handout   Comprehension Returned demonstration;Verbalized understanding          PT Short Term Goals - 06/12/14 1415    PT SHORT TERM GOAL #1   Title The patient will be indep with HEP for balance, dizziness and general mobility.  Target date 07/12/2014   Time 4   Period Weeks   PT SHORT TERM GOAL #2   Title The patient will improve Berg score from 37/56 up to 44/56 to demo decreasing risk for falls. Target date 07/12/2014   Time 4   Period Weeks   PT SHORT TERM GOAL #3   Title The  patient will improve gait speed from 2.57 ft/sec up to 3.0 ft/sec to demo improving functional ambulation. Target date 07/12/2014   Time 4   Period Weeks   PT SHORT TERM GOAL #4   Title The aptient will negotiate 4 steps without handrail with step to pattern modified indep.  Target date 07/12/2014.   Time 4   Period Weeks   PT SHORT TERM GOAL #5   Title The patient will report baseline dizziness < or equal to 3/10 (currently 5/10). Target date 07/12/2014.   Time 4   Period Weeks           PT Long Term Goals - 06/12/14 1417    PT LONG TERM GOAL #1   Title The patient will verbalize CVA warning signs/risk factors.  Target  date 08/11/2014   Time 8   Period Weeks   PT LONG TERM GOAL #2   Title The patient will improve Berg score from 37/56 up to 48/56 to demo decreased risk for falls.  Target date 08/11/2014.   Time 8   Period Weeks   PT LONG TERM GOAL #3   Title The patient will improve gait speed from 2.57 ft/sec up to > or equal to 3.4 ft/sec to demo improving functional ambulation.  Target date 08/11/2014.   Time 8   Period Weeks   PT LONG TERM GOAL #4   Title The patient will negotiate 4 steps without a handrail with reciprocal pattern independently.  Target date 08/11/2014   Time 8   Period Weeks   PT LONG TERM GOAL #5   Title The patient will negotiate indoor and outdoor surfaces without a device independently to demo return to full community mobility.  Target date 08/11/2014   Time 8   Period Weeks               Plan - 06/19/14 1134    Clinical Impression Statement The patient is progressing well with balance and dynamic gait.  PT provided habituation for central vertigo at today's visit.  Continue to STGs and LTGs.   PT Next Visit Plan Balance, dynamic gait, head turns.   Consulted and Agree with Plan of Care Patient        Problem List Patient Active Problem List   Diagnosis Date Noted  . 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 Essential hypertension   . CVA (cerebral infarction) 05/23/2014  . Basilar artery stenosis   . 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 Vertigo   . HLD (hyperlipidemia)   . 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 Intractable hiccups   . Stroke 05/20/2014  . Insufficiency of posterior brain circulation   . Rash and nonspecific skin eruption 04/10/2014  . Seborrheic keratoses 01/29/2014  . Skin irritation 09/06/2013  . Pain in joint, shoulder region 09/06/2013  . Nevus 08/26/2011  . FH: prostate cancer 08/26/2011  . Routine general medical examination at a health care facility 08/12/2010  .  HYPERCHOLESTEROLEMIA 07/11/2007    Eluzer Howdeshell, PT 06/19/2014, 11:36 AM  [facility] [address] [place], [place], [address] Phone: [phone]   Fax:  [phone]

## 2014-06-19 NOTE — Patient Instructions (Addendum)
Sit to Side-Lying   Sit on edge of bed. Look to the left and lie down onto your right side.  Hold until dizziness settles and return to sitting.  Hold until dizziness settles.  Repeat lying down to the left side.   Repeat sequence _5___ times per session. Do _2___ sessions per day.  Copyright  VHI. All rights reserved.  Turning in Place: Solid Surface   Standing in place, lead with head and turn slowly making a full turn towards the left.  Repeat to the right side. Do __5__ times per session. Do _2___ sessions per day.  Copyright  VHI. All rights reserved.  "I love a Parade" Lift   Using a chair or countertop to stand near for safety, slowly march your legs 10 times. Do ___2_ sessions per day.  http://gt2.exer.us/344   Copyright  VHI. All rights reserved.

## 2014-06-21 ENCOUNTER — Encounter: Payer: Self-pay | Admitting: Physical Therapy

## 2014-06-21 ENCOUNTER — Ambulatory Visit: Payer: 59 | Admitting: Physical Therapy

## 2014-06-21 DIAGNOSIS — R269 Unspecified abnormalities of gait and mobility: Secondary | ICD-10-CM

## 2014-06-21 DIAGNOSIS — H8149 Vertigo of central origin, unspecified ear: Secondary | ICD-10-CM

## 2014-06-21 NOTE — Therapy (Signed)
[facility] [address] [place], [place], [address] Phone: [phone]   Fax:  [phone]  Physical Therapy Treatment  Patient Details  Name: Alejandro Schneider MRN: [id number] Date of Birth: 1950-06-18 Referring Provider:  Tonia Ghent, MD  Encounter Date: 06/21/2014      PT End of Session - 06/21/14 1807    Visit Number 3   Number of Visits 16   Date for PT Re-Evaluation 08/11/14   PT Start Time 0805   PT Stop Time 0845   PT Time Calculation (min) 40 min   Equipment Utilized During Treatment Gait belt   Activity Tolerance Patient tolerated treatment well   Behavior During Therapy [facility] for tasks assessed/performed      Past Medical History  Diagnosis Date  . Hyperlipidemia   . CVA (cerebral infarction)     Past Surgical History  Procedure Laterality Date  . Tonsillectomy  1972  . Inguinal hernia repair  10/16/02    Right  Dr Hassell Done  . Colonoscopy  2006  . Lumbar laminectomy/decompression microdiscectomy Right 05/30/2012    Procedure: LUMBAR LAMINECTOMY/DECOMPRESSION MICRODISCECTOMY 1 LEVEL;  Surgeon: Faythe Ghee, MD;  [facility]: [facility];  Service: Neurosurgery;  Laterality: Right;  . Radiology with anesthesia N/A 05/21/2014    Procedure: ANGIOPLASTY POSSIBLE STENT;  Surgeon: Rob Hickman, MD;  Location: [facility];  Service: Radiology;  Laterality: N/A;    There were no vitals filed for this visit.  Visit Diagnosis:  Abnormality of gait  Central vestibular vertigo, unspecified laterality      Subjective Assessment - 06/21/14 0823    Symptoms No new compliants. Doing HEP without issues.    Currently in Pain? No/denies   Pain Score 0-No pain     Treatment In corner on air ex - feet apart with eyes closed and head movements: up/down, left/right and diagonals both ways x 10 each - feet together with eyes closes: same as above with light UE support needed on chair  In parallel bars on  balance board - eyes closed: hold steady, hold steady with head up/down, left/right and diagonals both ways with min assist and UE support at times on bars  Along counter top - heel walking, toe walking and tandem gait. All 3 laps each forward/backwards with min assist and UE support at times on counter top.   In hall way - gait with head turns up/down, left/right and diagonals both ways. Increased veering noted with left/right turns vs other's. Min assist for balance.   On ramp (performed facing both up and down) - Feet apart eyes closed: head movements up/down and left/right x 10 each  - Feet together eyes open: head movements up/down and left/right x 10 each        PT Short Term Goals - 06/12/14 1415    PT SHORT TERM GOAL #1   Title The patient will be indep with HEP for balance, dizziness and general mobility.  Target date 07/12/2014   Time 4   Period Weeks   PT SHORT TERM GOAL #2   Title The patient will improve Berg score from 37/56 up to 44/56 to demo decreasing risk for falls. Target date 07/12/2014   Time 4   Period Weeks   PT SHORT TERM GOAL #3   Title The patient will improve gait speed from 2.57 ft/sec up to 3.0 ft/sec to demo improving functional ambulation. Target date 07/12/2014   Time 4   Period [place]  PT SHORT TERM GOAL #4   Title The aptient will negotiate 4 steps without handrail with step to pattern modified indep.  Target date 07/12/2014.   Time 4   Period Weeks   PT SHORT TERM GOAL #5   Title The patient will report baseline dizziness < or equal to 3/10 (currently 5/10). Target date 07/12/2014.   Time 4   Period Weeks           PT Long Term Goals - 06/12/14 1417    PT LONG TERM GOAL #1   Title The patient will verbalize CVA warning signs/risk factors.  Target date 08/11/2014   Time 8   Period Weeks   PT LONG TERM GOAL #2   Title The patient will improve Berg score from 37/56 up to 48/56 to demo decreased risk for falls.  Target date 08/11/2014.   Time 8    Period Weeks   PT LONG TERM GOAL #3   Title The patient will improve gait speed from 2.57 ft/sec up to > or equal to 3.4 ft/sec to demo improving functional ambulation.  Target date 08/11/2014.   Time 8   Period Weeks   PT LONG TERM GOAL #4   Title The patient will negotiate 4 steps without a handrail with reciprocal pattern independently.  Target date 08/11/2014   Time 8   Period Weeks   PT LONG TERM GOAL #5   Title The patient will negotiate indoor and outdoor surfaces without a device independently to demo return to full community mobility.  Target date 08/11/2014   Time 8   Period Weeks            Plan - 06/21/14 1808    Clinical Impression Statement Pt is making steady progress toward his goals. Did not have any increased symptoms with today's session.    Pt will benefit from skilled therapeutic intervention in order to improve on the following deficits Decreased activity tolerance;Abnormal gait;Decreased balance;Decreased mobility;Impaired vision/preception;Postural dysfunction;Difficulty walking   Rehab Potential Good   PT Frequency 2x / week   PT Duration 8 weeks   PT Treatment/Interventions Therapeutic activities;Patient/family education;Therapeutic exercise;Balance training;Gait training;Stair training;Neuromuscular re-education;Functional mobility training   PT Next Visit Plan  add more balance HEP bring feet to more narrow base of support, head turns to tolerance, and dynamic gait activities.   Consulted and Agree with Plan of Care Patient        Problem List Patient Active Problem List   Diagnosis Date Noted  . 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 Essential hypertension   . CVA (cerebral infarction) 05/23/2014  . Basilar artery stenosis   . 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 Vertigo   . HLD (hyperlipidemia)   . 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 Intractable hiccups   . Stroke 05/20/2014  .  Insufficiency of posterior brain circulation   . Rash and nonspecific skin eruption 04/10/2014  . Seborrheic keratoses 01/29/2014  . Skin irritation 09/06/2013  . Pain in joint, shoulder region 09/06/2013  . Nevus 08/26/2011  . FH: prostate cancer 08/26/2011  . Routine general medical examination at a health care facility 08/12/2010  . HYPERCHOLESTEROLEMIA 07/11/2007    Willow Ora 06/21/2014, 6:10 PM  Willow Ora, PTA, [place] [address] [place],  [address] [phone] 06/21/2014, 6:10 PM

## 2014-06-25 ENCOUNTER — Ambulatory Visit: Payer: 59 | Admitting: Rehabilitative and Restorative Service Providers"

## 2014-06-25 DIAGNOSIS — R269 Unspecified abnormalities of gait and mobility: Secondary | ICD-10-CM

## 2014-06-25 DIAGNOSIS — H8149 Vertigo of central origin, unspecified ear: Secondary | ICD-10-CM

## 2014-06-25 NOTE — Therapy (Signed)
[facility] [address] [place], [place], [address] Phone: [phone]   Fax:  [phone]  Physical Therapy Treatment  Patient Details  Name: Alejandro Schneider MRN: [id number] Date of Birth: 09-Jul-1950 Referring Provider:  Joaquim Nam, MD  Encounter Date: 06/25/2014      PT End of Session - 06/25/14 0902    Visit Number 4   Number of Visits 16   Date for PT Re-Evaluation 08/11/14   PT Start Time 0802   PT Stop Time 0845   PT Time Calculation (min) 43 min   Equipment Utilized During Treatment Gait belt   Activity Tolerance Patient tolerated treatment well   Behavior During Therapy [facility] for tasks assessed/performed      Past Medical History  Diagnosis Date  . Hyperlipidemia   . CVA (cerebral infarction)     Past Surgical History  Procedure Laterality Date  . Tonsillectomy  1972  . Inguinal hernia repair  10/16/02    Right  Dr Daphine Deutscher  . Colonoscopy  2006  . Lumbar laminectomy/decompression microdiscectomy Right 05/30/2012    Procedure: LUMBAR LAMINECTOMY/DECOMPRESSION MICRODISCECTOMY 1 LEVEL;  Surgeon: Reinaldo Meeker, MD;  Location: [facility];  Service: Neurosurgery;  Laterality: Right;  . Radiology with anesthesia N/A 05/21/2014    Procedure: ANGIOPLASTY POSSIBLE STENT;  Surgeon: Oneal Grout, MD;  Location: [facility];  Service: Radiology;  Laterality: N/A;    There were no vitals filed for this visit.  Visit Diagnosis:  Abnormality of gait  Central vestibular vertigo, unspecified laterality      Subjective Assessment - 06/25/14 0802    Symptoms The patient reports dizziness described as spinning when closing his eyes.  He is not using his cane as frequently.  He reports his balance is improving a little bit, but still off describing "heavy to the left side".   Currently in Pain? No/denies    NEUROMUSCULAR RE-EDUCATION: Standing eye exercises including: Smooth pursuits horizontal, vertical  and diagonal planes Compensatory movements seated with "eyes/head" saccades  Balance exercises on compliant surfaces including: Head turns with feet together, feet apart and eyes closed, marching with eyes open and eyes closed, imaginary targets with feet apart Rocker board with head turns, rocker board with eyes closed, rocker board with reactive balance challenges  Standing narrowing base of support from 1/2 tandem to tandem *all with min A due to L leaning  Ramp activities in all planes adding narrowing base of support, eyes closed, and head turns with min A  Tandem gait activities, marching with turns with minA       PT Short Term Goals - 06/12/14 1415    PT SHORT TERM GOAL #1   Title The patient will be indep with HEP for balance, dizziness and general mobility.  Target date 07/12/2014   Time 4   Period Weeks   PT SHORT TERM GOAL #2   Title The patient will improve Berg score from 37/56 up to 44/56 to demo decreasing risk for falls. Target date 07/12/2014   Time 4   Period Weeks   PT SHORT TERM GOAL #3   Title The patient will improve gait speed from 2.57 ft/sec up to 3.0 ft/sec to demo improving functional ambulation. Target date 07/12/2014   Time 4   Period Weeks   PT SHORT TERM GOAL #4   Title The aptient will negotiate 4 steps without handrail with step to pattern modified indep.  Target date 07/12/2014.   Time 4  Period Weeks   PT SHORT TERM GOAL #5   Title The patient will report baseline dizziness < or equal to 3/10 (currently 5/10). Target date 07/12/2014.   Time 4   Period Weeks           PT Long Term Goals - 06/12/14 1417    PT LONG TERM GOAL #1   Title The patient will verbalize CVA warning signs/risk factors.  Target date 08/11/2014   Time 8   Period Weeks   PT LONG TERM GOAL #2   Title The patient will improve Berg score from 37/56 up to 48/56 to demo decreased risk for falls.  Target date 08/11/2014.   Time 8   Period Weeks   PT LONG TERM GOAL #3   Title The  patient will improve gait speed from 2.57 ft/sec up to > or equal to 3.4 ft/sec to demo improving functional ambulation.  Target date 08/11/2014.   Time 8   Period Weeks   PT LONG TERM GOAL #4   Title The patient will negotiate 4 steps without a handrail with reciprocal pattern independently.  Target date 08/11/2014   Time 8   Period Weeks   PT LONG TERM GOAL #5   Title The patient will negotiate indoor and outdoor surfaces without a device independently to demo return to full community mobility.  Target date 08/11/2014   Time 8   Period Weeks               Plan - 06/25/14 0902    Clinical Impression Statement The patient has significant left lean worse with compliant surfaces, and narrowing base of support.   PT Next Visit Plan  add more balance HEP bring feet to more narrow base of support, head turns to tolerance, and dynamic gait activities.   Consulted and Agree with Plan of Care Patient        Problem List Patient Active Problem List   Diagnosis Date Noted  . 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 Essential hypertension   . CVA (cerebral infarction) 05/23/2014  . Basilar artery stenosis   . 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 Vertigo   . HLD (hyperlipidemia)   . 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 Intractable hiccups   . Stroke 05/20/2014  . Insufficiency of posterior brain circulation   . Rash and nonspecific skin eruption 04/10/2014  . Seborrheic keratoses 01/29/2014  . Skin irritation 09/06/2013  . Pain in joint, shoulder region 09/06/2013  . Nevus 08/26/2011  . FH: prostate cancer 08/26/2011  . Routine general medical examination at a health care facility 08/12/2010  . HYPERCHOLESTEROLEMIA 07/11/2007    Hurbert Duran, PT 06/25/2014, 9:03 AM  [facility] [address] [place], [place], [address] Phone: [phone]   Fax:   [phone]

## 2014-06-27 ENCOUNTER — Ambulatory Visit: Payer: 59 | Admitting: Physical Therapy

## 2014-06-27 ENCOUNTER — Encounter: Payer: Self-pay | Admitting: Physical Therapy

## 2014-06-27 DIAGNOSIS — R269 Unspecified abnormalities of gait and mobility: Secondary | ICD-10-CM

## 2014-06-27 DIAGNOSIS — H8149 Vertigo of central origin, unspecified ear: Secondary | ICD-10-CM

## 2014-06-27 NOTE — Patient Instructions (Signed)
Feet Together (Compliant Surface) Varied Arm Positions - Eyes Closed   Stand on compliant surface: __pillow______ with feet together and arms out. Close eyes and visualize upright position. Hold_15___ seconds. Repeat 3___ times per session. Do __1-2__ sessions per day.  Copyright  VHI. All rights reserved.  Feet Together (Compliant Surface) Head Motion - Eyes Closed   Stand on compliant surface: __pillow______ with feet together. Close eyes and move head slowly, up and down, left/right and diagonals both ways. Repeat __10__ times each. Do __1-2 sessions per day.  Copyright  VHI. All rights reserved.  Feet Partial Heel-Toe (Compliant Surface) Varied Arm Positions - Eyes Closed   Stand on compliant surface: __pillow______ with right foot partially in front of the other and arms out. Close eyes and visualize upright position. Hold__15__ seconds. Repeat _3___ times per session. Do _1-2___ sessions per day.  Copyright  VHI. All rights reserved.  Feet Partial Heel-Toe (Compliant Surface) Head Motion - Eyes Closed   Stand on compliant surface: _pillow______ with right foot partially in front of the other. Close eyes and move head slowly, up and down, left/right and diagonals. Rest between Repeat __10__ times per session. Do __1-2__ sessions per day.  Copyright  VHI. All rights reserved.  Side to Side Head Motion   Perform at counter top with left side next to counter: Walking on solid surface, turn head and eyes to left for __1-2__ steps, Then turn head and eyes to opposite side for _1-2___ steps. Repeat sequence 3 laps forward and backward along counter.  Do __1-2__ sessions per day. .  Copyright  VHI. All rights reserved.  Feet Close Together   With left side next to counter top: Place feet closer together than normal width and walk on toes while maintaining a straight path forward and backward. Repeat for 3 laps each way. Do _1-2___ sessions per day.   Copyright  VHI.  All rights reserved.  Feet Heel-Toe "Tandem"   With left side next to counter top: Arms as needed by side or on counter,  walk a straight line forward and then backward. Repeat for _3 laps each way. 1-2 times a day. Copyright  VHI. All rights reserved.

## 2014-06-28 NOTE — Therapy (Signed)
[facility] [address] [place], [place], [address] Phone: [phone]   Fax:  [phone]  Physical Therapy Treatment  Patient Details  Name: Alejandro Schneider MRN: [id number] Date of Birth: 07/03/1950 Referring Provider:  Tonia Ghent, MD  Encounter Date: 06/27/2014     06/27/14 0851  Symptoms/Limitations  Symptoms Reports still having constant spinning with both eyes open and closed. No falls. Feels balance is a little better, however still with left sided imbalance.  Pain Assessment  Currently in Pain? No/denies  Pain Score 0    06/27/14 0852  PT Visits / Re-Eval  Visit Number 5  Number of Visits 16  Date for PT Re-Evaluation 08/11/14  PT Time Calculation  PT Start Time 0847  PT Stop Time 0930  PT Time Calculation (min) 43 min  PT - End of Session  Equipment Utilized During Treatment Gait belt  Activity Tolerance Patient tolerated treatment well  Behavior During Therapy WFL for tasks assessed/performed    Past Medical History  Diagnosis Date  . Hyperlipidemia   . CVA (cerebral infarction)     Past Surgical History  Procedure Laterality Date  . Tonsillectomy  1972  . Inguinal hernia repair  10/16/02    Right  Dr Hassell Done  . Colonoscopy  2006  . Lumbar laminectomy/decompression microdiscectomy Right 05/30/2012    Procedure: LUMBAR LAMINECTOMY/DECOMPRESSION MICRODISCECTOMY 1 LEVEL;  Surgeon: Faythe Ghee, MD;  Location: [facility];  Service: Neurosurgery;  Laterality: Right;  . Radiology with anesthesia N/A 05/21/2014    Procedure: ANGIOPLASTY POSSIBLE STENT;  Surgeon: Rob Hickman, MD;  Location: [place];  Service: Radiology;  Laterality: N/A;    There were no vitals filed for this visit.  Visit Diagnosis:  Abnormality of gait  Central vestibular vertigo, unspecified laterality   Treatment: In corner on pillow: - feet together: eyes closed 15 sec x 3 reps, eyes closed with head  movements up/down, left/right and diagonals both ways. Min assist with occasional UE support needed for balance. Left lean/loss of balance most noted. Added pillow to HEP. - feet partial tandem: as above with min assist, more assistance needed with right foot forward.   At counter: left side to counter for safety. - tandem walk forward/backward with min assist and UE support on counter needed. 3 laps each way. Added to HEP. - forward/backward walking with head turns left/right x 3 laps each way. Min assist. Added to HEP.  - with narrow base of support, toe walk forward/backward x 3 laps each way, min assist for balance. Added to HEP.  Rocker board: occasional UE support needed on bars with min assist for balance - both ways on board: eyes closed, eyes closed with head up/down, left/right and diagonals both ways.          PT Short Term Goals - 06/12/14 1415    PT SHORT TERM GOAL #1   Title The patient will be indep with HEP for balance, dizziness and general mobility.  Target date 07/12/2014   Time 4   Period Weeks   PT SHORT TERM GOAL #2   Title The patient will improve Berg score from 37/56 up to 44/56 to demo decreasing risk for falls. Target date 07/12/2014   Time 4   Period Weeks   PT SHORT TERM GOAL #3   Title The patient will improve gait speed from 2.57 ft/sec up to 3.0 ft/sec to demo improving functional ambulation. Target date 07/12/2014   Time 4  Period Weeks   PT SHORT TERM GOAL #4   Title The aptient will negotiate 4 steps without handrail with step to pattern modified indep.  Target date 07/12/2014.   Time 4   Period Weeks   PT SHORT TERM GOAL #5   Title The patient will report baseline dizziness < or equal to 3/10 (currently 5/10). Target date 07/12/2014.   Time 4   Period Weeks           PT Long Term Goals - 06/12/14 1417    PT LONG TERM GOAL #1   Title The patient will verbalize CVA warning signs/risk factors.  Target date 08/11/2014   Time 8   Period Weeks   PT  LONG TERM GOAL #2   Title The patient will improve Berg score from 37/56 up to 48/56 to demo decreased risk for falls.  Target date 08/11/2014.   Time 8   Period Weeks   PT LONG TERM GOAL #3   Title The patient will improve gait speed from 2.57 ft/sec up to > or equal to 3.4 ft/sec to demo improving functional ambulation.  Target date 08/11/2014.   Time 8   Period Weeks   PT LONG TERM GOAL #4   Title The patient will negotiate 4 steps without a handrail with reciprocal pattern independently.  Target date 08/11/2014   Time 8   Period Weeks   PT LONG TERM GOAL #5   Title The patient will negotiate indoor and outdoor surfaces without a device independently to demo return to full community mobility.  Target date 08/11/2014   Time 8   Period Weeks        06/27/14 2992  Plan  Clinical Impression Statement Pt continues to have a significant left lean when on compliant surfaces and with narrowed base of support. Advance pt's HEP today to address this. Progressing towards goals.  Pt will benefit from skilled therapeutic intervention in order to improve on the following deficits Decreased activity tolerance;Abnormal gait;Decreased balance;Decreased mobility;Impaired vision/preception;Postural dysfunction;Difficulty walking  Rehab Potential Good  PT Frequency 2x / week  PT Duration 8 weeks  PT Treatment/Interventions Therapeutic activities;Patient/family education;Therapeutic exercise;Balance training;Gait training;Stair training;Neuromuscular re-education;Functional mobility training  PT Next Visit Plan  head turns to tolerance, and dynamic gait activities.  Consulted and Agree with Plan of Care Patient     Problem List Patient Active Problem List   Diagnosis Date Noted  . 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 Essential hypertension   . CVA (cerebral infarction) 05/23/2014  . Basilar artery stenosis   . 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 Vertigo   . HLD  (hyperlipidemia)   . 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 Intractable hiccups   . Stroke 05/20/2014  . Insufficiency of posterior brain circulation   . Rash and nonspecific skin eruption 04/10/2014  . Seborrheic keratoses 01/29/2014  . Skin irritation 09/06/2013  . Pain in joint, shoulder region 09/06/2013  . Nevus 08/26/2011  . FH: prostate cancer 08/26/2011  . Routine general medical examination at a health care facility 08/12/2010  . HYPERCHOLESTEROLEMIA 07/11/2007    Willow Ora 06/28/2014, 9:19 PM  Willow Ora, PTA, [place] [address] [place], [place] [address] [phone] 06/28/2014, 9:19 PM

## 2014-07-02 ENCOUNTER — Other Ambulatory Visit (HOSPITAL_COMMUNITY): Payer: Self-pay | Admitting: Interventional Radiology

## 2014-07-02 ENCOUNTER — Encounter: Payer: Self-pay | Admitting: Physical Therapy

## 2014-07-02 ENCOUNTER — Ambulatory Visit: Payer: 59 | Admitting: Physical Therapy

## 2014-07-02 DIAGNOSIS — I771 Stricture of artery: Secondary | ICD-10-CM

## 2014-07-02 DIAGNOSIS — R269 Unspecified abnormalities of gait and mobility: Secondary | ICD-10-CM | POA: Diagnosis not present

## 2014-07-02 DIAGNOSIS — H8149 Vertigo of central origin, unspecified ear: Secondary | ICD-10-CM

## 2014-07-02 NOTE — Therapy (Signed)
[facility] [address] [place], [place], [address] Phone: [phone]   Fax:  [phone]  Physical Therapy Treatment  Patient Details  Name: Alejandro Schneider MRN: [id number] Date of Birth: 10-14-1950 Referring Provider:  Tonia Ghent, MD  Encounter Date: 07/02/2014      PT End of Session - 07/02/14 1218    Visit Number 6   Number of Visits 16   Date for PT Re-Evaluation 08/11/14   PT Start Time 1100   PT Stop Time 1140   PT Time Calculation (min) 40 min   Equipment Utilized During Treatment Gait belt   Activity Tolerance Patient tolerated treatment well   Behavior During Therapy [facility] for tasks assessed/performed      Past Medical History  Diagnosis Date  . Hyperlipidemia   . CVA (cerebral infarction)     Past Surgical History  Procedure Laterality Date  . Tonsillectomy  1972  . Inguinal hernia repair  10/16/02    Right  Dr Hassell Done  . Colonoscopy  2006  . Lumbar laminectomy/decompression microdiscectomy Right 05/30/2012    Procedure: LUMBAR LAMINECTOMY/DECOMPRESSION MICRODISCECTOMY 1 LEVEL;  Surgeon: Faythe Ghee, MD;  Location: [facility];  Service: Neurosurgery;  Laterality: Right;  . Radiology with anesthesia N/A 05/21/2014    Procedure: ANGIOPLASTY POSSIBLE STENT;  Surgeon: Rob Hickman, MD;  Location: [place];  Service: Radiology;  Laterality: N/A;    There were no vitals filed for this visit.  Visit Diagnosis:  Abnormality of gait  Central vestibular vertigo, unspecified laterality      Subjective Assessment - 07/02/14 1104    Symptoms (p) New ex's going okay. Still have dizziness daily, pretty constant basis, does clear up at night sometimes. No falls.   Currently in Pain? (p) Yes   Pain Descriptors / Indicators (p) Headache   Pain Type (p) Acute pain   Pain Onset (p) Today     Treatment: Min to mod assist with all activities with cues on midline position and weight shifting  to achieve this  Corner on air ex - feet together: eyes closed no head movement, eyes closed with head nods, head turns and head diagonals both ways. - modified tandem standing: all the above performed with each foot forward  At counter: left side near counter - tandem walking forward/backwards x 3 laps each way - narrow base of support toe walking x 3 laps each way - high knee marching forward/backwards with head turns left/right x 3 laps each way  Balance board - head movements with eyes closed performed both ways on balance board with narrowed base of support. Increased assistance needed on the lateral directions vs the anterior/posterior direction   On Ramp: performed facing both up and down the ramp. Increased assistance needed with facing up ramp - narrow base of support: head movements all directions with eyes closed.  Blue mat with next to mat table: - figure 8 with basketball x 5 laps - with narrow base of support: ball pass left/right at varied heights x 2-3 minutes - tandem standing ball pass left/right at varied heights x 2-3 minutes  In hallway: - gait with head turns left/right, up/down along 40 foot path x 1 lap each - forward/backward marching with head turns left/right and self ball toss along 40 foot path x 2 laps each way - forward/backward tandem gait with ball toss, no head movements, along 40 foot path x 2 laps each way  PT Short Term Goals - 06/12/14 1415    PT SHORT TERM GOAL #1   Title The patient will be indep with HEP for balance, dizziness and general mobility.  Target date 07/12/2014   Time 4   Period Weeks   PT SHORT TERM GOAL #2   Title The patient will improve Berg score from 37/56 up to 44/56 to demo decreasing risk for falls. Target date 07/12/2014   Time 4   Period Weeks   PT SHORT TERM GOAL #3   Title The patient will improve gait speed from 2.57 ft/sec up to 3.0 ft/sec to demo improving functional ambulation. Target date 07/12/2014    Time 4   Period Weeks   PT SHORT TERM GOAL #4   Title The aptient will negotiate 4 steps without handrail with step to pattern modified indep.  Target date 07/12/2014.   Time 4   Period Weeks   PT SHORT TERM GOAL #5   Title The patient will report baseline dizziness < or equal to 3/10 (currently 5/10). Target date 07/12/2014.   Time 4   Period Weeks           PT Long Term Goals - 06/12/14 1417    PT LONG TERM GOAL #1   Title The patient will verbalize CVA warning signs/risk factors.  Target date 08/11/2014   Time 8   Period Weeks   PT LONG TERM GOAL #2   Title The patient will improve Berg score from 37/56 up to 48/56 to demo decreased risk for falls.  Target date 08/11/2014.   Time 8   Period Weeks   PT LONG TERM GOAL #3   Title The patient will improve gait speed from 2.57 ft/sec up to > or equal to 3.4 ft/sec to demo improving functional ambulation.  Target date 08/11/2014.   Time 8   Period Weeks   PT LONG TERM GOAL #4   Title The patient will negotiate 4 steps without a handrail with reciprocal pattern independently.  Target date 08/11/2014   Time 8   Period Weeks   PT LONG TERM GOAL #5   Title The patient will negotiate indoor and outdoor surfaces without a device independently to demo return to full community mobility.  Target date 08/11/2014   Time 8   Period Weeks           Plan - 07/02/14 1218    Clinical Impression Statement Pt demo'd improved ability to correct left lateral lean today with balance activities. Progressing toward goals.   Pt will benefit from skilled therapeutic intervention in order to improve on the following deficits Decreased activity tolerance;Abnormal gait;Decreased balance;Decreased mobility;Impaired vision/preception;Postural dysfunction;Difficulty walking   Rehab Potential Good   PT Frequency 2x / week   PT Duration 8 weeks   PT Treatment/Interventions Therapeutic activities;Patient/family education;Therapeutic exercise;Balance training;Gait  training;Stair training;Neuromuscular re-education;Functional mobility training   PT Next Visit Plan continue with balance activites with narrow base of support, dynamic gait activities   Consulted and Agree with Plan of Care Patient        Problem List Patient Active Problem List   Diagnosis Date Noted  . 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 Essential hypertension   . CVA (cerebral infarction) 05/23/2014  . Basilar artery stenosis   . 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 Vertigo   . HLD (hyperlipidemia)   . 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 Intractable hiccups   .  Stroke 05/20/2014  . Insufficiency of posterior brain circulation   . Rash and nonspecific skin eruption 04/10/2014  . Seborrheic keratoses 01/29/2014  . Skin irritation 09/06/2013  . Pain in joint, shoulder region 09/06/2013  . Nevus 08/26/2011  . FH: prostate cancer 08/26/2011  . Routine general medical examination at a health care facility 08/12/2010  . HYPERCHOLESTEROLEMIA 07/11/2007    Willow Ora 07/02/2014, 12:20 PM  Willow Ora, PTA, [facility] [address], [place], [place] [address] [phone] 07/02/2014, 12:20 PM

## 2014-07-05 ENCOUNTER — Ambulatory Visit: Payer: 59 | Attending: Physical Medicine & Rehabilitation | Admitting: Physical Therapy

## 2014-07-05 ENCOUNTER — Encounter: Payer: Self-pay | Admitting: Physical Therapy

## 2014-07-05 DIAGNOSIS — R2689 Other abnormalities of gait and mobility: Secondary | ICD-10-CM | POA: Diagnosis not present

## 2014-07-05 DIAGNOSIS — H8149 Vertigo of central origin, unspecified ear: Secondary | ICD-10-CM | POA: Diagnosis not present

## 2014-07-05 DIAGNOSIS — R269 Unspecified abnormalities of gait and mobility: Secondary | ICD-10-CM | POA: Insufficient documentation

## 2014-07-05 NOTE — Therapy (Signed)
[facility] [address] [place], [place], [address] Phone: [phone]   Fax:  [phone]  Physical Therapy Treatment  Patient Details  Name: Alejandro Schneider MRN: [id number] Date of Birth: [birth year] Referring Provider:  Tonia Ghent, MD  Encounter Date: 07/05/2014      PT End of Session - 07/05/14 0806    Visit Number 7   Number of Visits 16   Date for PT Re-Evaluation 08/11/14   PT Start Time 0804   PT Stop Time 0844   PT Time Calculation (min) 40 min   Equipment Utilized During Treatment Gait belt   Activity Tolerance Patient tolerated treatment well   Behavior During Therapy [facility] for tasks assessed/performed      Past Medical History  Diagnosis Date  . Hyperlipidemia   . CVA (cerebral infarction)     Past Surgical History  Procedure Laterality Date  . Tonsillectomy  1972  . Inguinal hernia repair  10/16/02    Right  Dr Hassell Done  . Colonoscopy  2006  . Lumbar laminectomy/decompression microdiscectomy Right 05/30/2012    Procedure: LUMBAR LAMINECTOMY/DECOMPRESSION MICRODISCECTOMY 1 LEVEL;  Surgeon: Faythe Ghee, MD;  Location: [facility];  Service: Neurosurgery;  Laterality: Right;  . Radiology with anesthesia N/A 05/21/2014    Procedure: ANGIOPLASTY POSSIBLE STENT;  Surgeon: Rob Hickman, MD;  Location: [facility];  Service: Radiology;  Laterality: N/A;    There were no vitals filed for this visit.  Visit Diagnosis:  Central vestibular vertigo, unspecified laterality      Subjective Assessment - 07/05/14 0806    Symptoms Reports he feels his dizziness/"eye flutters" are getting better. No falls.    Currently in Pain? No/denies   Pain Score 0-No pain     Treatment: In corner on blue cloth foam (folded/doubled over) With feet together - eyes closed hold, head movements all directions with eyes open and then head movements all directions with eyes closed. Min to mod assist for balance and  midline position. Cues on posture and weight shifting.  Modified tandem standing: performed with both feet in front - eyes closed hold, head movements all directions with eyes open and then head movements all directions with eyes closed. Min assist with left foot forward and min to mod assist with right foot forward. Cues on posture and weight shifting.  In parallel bars on balance board: performed both ways on balance board - static hold with eyes closed, head movements all directions with eyes closed. Min to mod assist with cues on posture, mid line position and weight shifting.  On blue mat - feet together: with ball diagonal "chops" each way, increased assist for balance with high right to low left direction  - feet in modified tandem: ball pass left to right x 5 each foot forward, increased assist needed for balance with right foot forward, with eyes open. With eyes closed: ball movements left/right (not passing) x 10 reps         PT Short Term Goals - 06/12/14 1415    PT SHORT TERM GOAL #1   Title The patient will be indep with HEP for balance, dizziness and general mobility.  Target date 07/12/2014   Time 4   Period Weeks   PT SHORT TERM GOAL #2   Title The patient will improve Berg score from 37/56 up to 44/56 to demo decreasing risk for falls. Target date 07/12/2014   Time 4   Period Weeks   PT  SHORT TERM GOAL #3   Title The patient will improve gait speed from 2.57 ft/sec up to 3.0 ft/sec to demo improving functional ambulation. Target date 07/12/2014   Time 4   Period Weeks   PT SHORT TERM GOAL #4   Title The aptient will negotiate 4 steps without handrail with step to pattern modified indep.  Target date 07/12/2014.   Time 4   Period Weeks   PT SHORT TERM GOAL #5   Title The patient will report baseline dizziness < or equal to 3/10 (currently 5/10). Target date 07/12/2014.   Time 4   Period Weeks           PT Long Term Goals - 06/12/14 1417    PT LONG TERM GOAL #1    Title The patient will verbalize CVA warning signs/risk factors.  Target date 08/11/2014   Time 8   Period Weeks   PT LONG TERM GOAL #2   Title The patient will improve Berg score from 37/56 up to 48/56 to demo decreased risk for falls.  Target date 08/11/2014.   Time 8   Period Weeks   PT LONG TERM GOAL #3   Title The patient will improve gait speed from 2.57 ft/sec up to > or equal to 3.4 ft/sec to demo improving functional ambulation.  Target date 08/11/2014.   Time 8   Period Weeks   PT LONG TERM GOAL #4   Title The patient will negotiate 4 steps without a handrail with reciprocal pattern independently.  Target date 08/11/2014   Time 8   Period Weeks   PT LONG TERM GOAL #5   Title The patient will negotiate indoor and outdoor surfaces without a device independently to demo return to full community mobility.  Target date 08/11/2014   Time 8   Period Weeks           Plan - 07/05/14 7564    Clinical Impression Statement Pt making progress toward goals. Still with left lateral shift when base of support is narrowed, however pt demo's improved recognition and correction of this.   Pt will benefit from skilled therapeutic intervention in order to improve on the following deficits Decreased activity tolerance;Abnormal gait;Decreased balance;Decreased mobility;Impaired vision/preception;Postural dysfunction;Difficulty walking   Rehab Potential Good   PT Frequency 2x / week   PT Duration 8 weeks   PT Treatment/Interventions Therapeutic activities;Patient/family education;Therapeutic exercise;Balance training;Gait training;Stair training;Neuromuscular re-education;Functional mobility training   PT Next Visit Plan continue with balance activites with narrow base of support, dynamic gait activities   Consulted and Agree with Plan of Care Patient        Problem List Patient Active Problem List   Diagnosis Date Noted  . 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 Essential hypertension   .  CVA (cerebral infarction) 05/23/2014  . Basilar artery stenosis   . 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 Vertigo   . HLD (hyperlipidemia)   . 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 Intractable hiccups   . Stroke 05/20/2014  . Insufficiency of posterior brain circulation   . Rash and nonspecific skin eruption 04/10/2014  . Seborrheic keratoses 01/29/2014  . Skin irritation 09/06/2013  . Pain in joint, shoulder region 09/06/2013  . Nevus 08/26/2011  . FH: prostate cancer 08/26/2011  . Routine general medical examination at a health care facility 08/12/2010  . HYPERCHOLESTEROLEMIA 07/11/2007    Willow Ora 07/05/2014, 12:20 PM  Willow Ora,  PTA, [facility] [address] [place], [place] [address] [phone] 07/05/2014, 12:20 PM

## 2014-07-09 ENCOUNTER — Ambulatory Visit: Payer: 59 | Admitting: Rehabilitative and Restorative Service Providers"

## 2014-07-09 DIAGNOSIS — H8149 Vertigo of central origin, unspecified ear: Secondary | ICD-10-CM

## 2014-07-09 DIAGNOSIS — R269 Unspecified abnormalities of gait and mobility: Secondary | ICD-10-CM | POA: Diagnosis not present

## 2014-07-09 NOTE — Therapy (Signed)
[facility] [address] [place], [place], [address] Phone: [phone]   Fax:  [phone]  Physical Therapy Treatment  Patient Details  Name: Alejandro Schneider MRN: [id number] Date of Birth: [birth year] Referring Provider:  Joaquim Nam, MD  Encounter Date: 07/09/2014      PT End of Session - 07/09/14 1350    Visit Number 8   Number of Visits 16   Date for PT Re-Evaluation 08/11/14   PT Start Time 0804   PT Stop Time 0850   PT Time Calculation (min) 46 min   Equipment Utilized During Treatment Gait belt   Activity Tolerance Patient tolerated treatment well   Behavior During Therapy [facility] for tasks assessed/performed      Past Medical History  Diagnosis Date  . Hyperlipidemia   . CVA (cerebral infarction)     Past Surgical History  Procedure Laterality Date  . Tonsillectomy  1972  . Inguinal hernia repair  10/16/02    Right  Dr Daphine Deutscher  . Colonoscopy  2006  . Lumbar laminectomy/decompression microdiscectomy Right 05/30/2012    Procedure: LUMBAR LAMINECTOMY/DECOMPRESSION MICRODISCECTOMY 1 LEVEL;  Surgeon: Reinaldo Meeker, MD;  Location: [facility];  Service: Neurosurgery;  Laterality: Right;  . Radiology with anesthesia N/A 05/21/2014    Procedure: ANGIOPLASTY POSSIBLE STENT;  Surgeon: Oneal Grout, MD;  Location: [facility];  Service: Radiology;  Laterality: N/A;    There were no vitals filed for this visit.  Visit Diagnosis:  Abnormality of gait  Central vestibular vertigo, unspecified laterality      Subjective Assessment - 07/09/14 0805    Subjective The patient reports walking improved, however he still feels pulled to one side.   Currently in Pain? No/denies    Pt reports that his vision is fine upon waking when lying down.  When he sits up, he notes blurry vision at a distance.  "I have to close one eye to read" or the words run together.  Dizziness upon sitting up is 4/10 at baseline and  remains the majority of the day.  Movements aggravate symptom of "dizziness" further described as "hard to focus".  He occasionally notes sensation of room spinning with eyes closed and quick movements.  He feels symptoms are improving.   He also notes decreased temperature and pain sensation on the R side of his body. Pt pulls to the left with balance activities and walking. " I feel like I had cotton in my ear" and that has improved.   Gait: Stair negotiation with reciprocal pattern modified indep with one handrail. Gait with direction changes including forward/backwards, sidestepping, heel/toe walking, gait with eyes closed and CGA for safety     [facility] PT Assessment - 07/09/14 0807    Ambulation/Gait   Ambulation/Gait Yes   Ambulation/Gait Assistance 7: Independent   Gait Pattern --  L leaning   Gait velocity 3.54 ft/sec   Standardized Balance Assessment   Standardized Balance Assessment Berg Balance Test   Berg Balance Test   Sit to Stand Able to stand without using hands and stabilize independently   Standing Unsupported Able to stand safely 2 minutes   Sitting with Back Unsupported but Feet Supported on Floor or Stool Able to sit safely and securely 2 minutes   Stand to Sit Sits safely with minimal use of hands   Transfers Able to transfer safely, minor use of hands   Standing Unsupported with Eyes Closed Able to stand 10 seconds safely  Standing Ubsupported with Feet Together Able to place feet together independently and stand 1 minute safely   From Standing, Reach Forward with Outstretched Arm Can reach confidently >25 cm (10")   From Standing Position, Pick up Object from Floor Able to pick up shoe safely and easily   From Standing Position, Turn to Look Behind Over each Shoulder Turn sideways only but maintains balance   Turn 360 Degrees Able to turn 360 degrees safely in 4 seconds or less   Standing Unsupported, Alternately Place Feet on Step/Stool Able to stand  independently and safely and complete 8 steps in 20 seconds   Standing Unsupported, One Foot in Front Able to take small step independently and hold 30 seconds   Standing on One Leg Unable to try or needs assist to prevent fall   Total Score 48      NEUROMUSCULAR RE-EDUCATION: Berg=48/56 Balance activities on compliant surfaces with eyes closed, head turns + eyes open, and proactive balance activities Single limb stance training using vision for compensatory strategies to align to midline Tandem stance on level surfaces and 1/2 tandem on compliant mat surfaces with min A Standing foot taps to cones with min A on compliant surfaces Wide and narrow marching activities Diagonal reaching with head movements without provoking further dizziness Sidestepping and braiding with CGA       PT Short Term Goals - 07/09/14 6045    PT SHORT TERM GOAL #1   Title The patient will be indep with HEP for balance, dizziness and general mobility.  Target date 07/12/2014   Baseline Met on 07/09/2014.   Time 4   Period Weeks   Status Achieved   PT SHORT TERM GOAL #2   Title The patient will improve Berg score from 37/56 up to 44/56 to demo decreasing risk for falls. Target date 07/12/2014   Baseline Met on 07/09/2014 scoring 48/56.   Time 4   Period Weeks   Status Achieved   PT SHORT TERM GOAL #3   Title The patient will improve gait speed from 2.57 ft/sec up to 3.0 ft/sec to demo improving functional ambulation. Target date 07/12/2014   Baseline Met on 07/09/2014 with speed 3.54 ft/sec.   Time 4   Period Weeks   Status Achieved   PT SHORT TERM GOAL #4   Title The aptient will negotiate 4 steps without handrail with step to pattern modified indep.  Target date 07/12/2014.   Baseline Goal met on 07/09/2014   Time 4   Period Weeks   Status Achieved   PT SHORT TERM GOAL #5   Title The patient will report baseline dizziness < or equal to 3/10 (currently 5/10). Target date 07/12/2014.   Baseline Improved to 4/10 on  07/09/2014.   Time 4   Period Weeks   Status Partially Met           PT Long Term Goals - 06/12/14 1417    PT LONG TERM GOAL #1   Title The patient will verbalize CVA warning signs/risk factors.  Target date 08/11/2014   Time 8   Period Weeks   PT LONG TERM GOAL #2   Title The patient will improve Berg score from 37/56 up to 48/56 to demo decreased risk for falls.  Target date 08/11/2014.   Time 8   Period Weeks   PT LONG TERM GOAL #3   Title The patient will improve gait speed from 2.57 ft/sec up to > or equal to 3.4 ft/sec to demo  improving functional ambulation.  Target date 08/11/2014.   Time 8   Period Weeks   PT LONG TERM GOAL #4   Title The patient will negotiate 4 steps without a handrail with reciprocal pattern independently.  Target date 08/11/2014   Time 8   Period Weeks   PT LONG TERM GOAL #5   Title The patient will negotiate indoor and outdoor surfaces without a device independently to demo return to full community mobility.  Target date 08/11/2014   Time 8   Period Weeks               Plan - 07/09/14 0844    Clinical Impression Statement The patient met 4/5 STGs.  He continues with dizziness rated 4/10 and further described as hard to focus/visual deficits.  The patient is functioning well on level surfaces.  He continues to have difficulty with unlevel surface negotiation, narrow base of support and eyes closed/absent vision conditions.  Continue to LTGs.   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   Recommended Other Services ? Consider OT referral for visual perceptual deficits   Consulted and Agree with Plan of Care Patient        Problem List Patient Active Problem List   Diagnosis Date Noted  . 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 Essential hypertension   . CVA (cerebral infarction) 05/23/2014   . Basilar artery stenosis   . 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 Vertigo   . HLD (hyperlipidemia)   . 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 Intractable hiccups   . Stroke 05/20/2014  . Insufficiency of posterior brain circulation   . Rash and nonspecific skin eruption 04/10/2014  . Seborrheic keratoses 01/29/2014  . Skin irritation 09/06/2013  . Pain in joint, shoulder region 09/06/2013  . Nevus 08/26/2011  . FH: prostate cancer 08/26/2011  . Routine general medical examination at a health care facility 08/12/2010  . HYPERCHOLESTEROLEMIA 07/11/2007    Dartanyan Deasis, PT 07/09/2014, 1:53 PM  [facility] [address] [place], [place], [address] Phone: [phone]   Fax:  [phone]

## 2014-07-12 ENCOUNTER — Ambulatory Visit: Payer: 59 | Admitting: Physical Therapy

## 2014-07-12 ENCOUNTER — Encounter: Payer: Self-pay | Admitting: Physical Therapy

## 2014-07-12 ENCOUNTER — Encounter: Payer: Self-pay | Admitting: Physical Medicine & Rehabilitation

## 2014-07-12 ENCOUNTER — Ambulatory Visit (HOSPITAL_BASED_OUTPATIENT_CLINIC_OR_DEPARTMENT_OTHER): Payer: 59 | Admitting: Physical Medicine & Rehabilitation

## 2014-07-12 ENCOUNTER — Encounter: Payer: 59 | Attending: Physical Medicine & Rehabilitation

## 2014-07-12 VITALS — BP 144/86 | HR 60 | Resp 14

## 2014-07-12 DIAGNOSIS — H8149 Vertigo of central origin, unspecified ear: Secondary | ICD-10-CM | POA: Insufficient documentation

## 2014-07-12 DIAGNOSIS — G464 Cerebellar stroke syndrome: Secondary | ICD-10-CM | POA: Insufficient documentation

## 2014-07-12 DIAGNOSIS — G463 Brain stem stroke syndrome: Principal | ICD-10-CM

## 2014-07-12 DIAGNOSIS — I63012 Cerebral infarction due to thrombosis of left vertebral artery: Secondary | ICD-10-CM | POA: Diagnosis not present

## 2014-07-12 DIAGNOSIS — I69393 Ataxia following cerebral infarction: Secondary | ICD-10-CM

## 2014-07-12 DIAGNOSIS — R269 Unspecified abnormalities of gait and mobility: Secondary | ICD-10-CM | POA: Diagnosis not present

## 2014-07-12 NOTE — Therapy (Signed)
[facility] [address] [place], [place], [address] Phone: [phone]   Fax:  [phone]  Physical Therapy Treatment  Patient Details  Name: Alejandro Schneider MRN: [id number] Date of Birth: September 15, 1950 Referring Provider:  Tonia Ghent, MD  Encounter Date: 07/12/2014      PT End of Session - 07/12/14 0809    Visit Number 9   Number of Visits 16   Date for PT Re-Evaluation 08/11/14   PT Start Time 0804   PT Stop Time 0845   PT Time Calculation (min) 41 min   Equipment Utilized During Treatment Gait belt   Activity Tolerance Patient tolerated treatment well   Behavior During Therapy [facility] for tasks assessed/performed      Past Medical History  Diagnosis Date  . Hyperlipidemia   . CVA (cerebral infarction)     Past Surgical History  Procedure Laterality Date  . Tonsillectomy  1972  . Inguinal hernia repair  10/16/02    Right  Dr Hassell Done  . Colonoscopy  2006  . Lumbar laminectomy/decompression microdiscectomy Right 05/30/2012    Procedure: LUMBAR LAMINECTOMY/DECOMPRESSION MICRODISCECTOMY 1 LEVEL;  Surgeon: Faythe Ghee, MD;  Location: [facility];  Service: Neurosurgery;  Laterality: Right;  . Radiology with anesthesia N/A 05/21/2014    Procedure: ANGIOPLASTY POSSIBLE STENT;  Surgeon: Rob Hickman, MD;  Location: [facility] [place];  Service: Radiology;  Laterality: N/A;    There were no vitals filed for this visit.  Visit Diagnosis:  Central vestibular vertigo, unspecified laterality      Subjective Assessment - 07/12/14 0808    Subjective Reports the dizziness comes and goes now, vs being constant. No falls.   Pain Score 0-No pain      Treatment  Seated on large blue pball - bouncing: with eyes open, eyes open with head turns/nods/diagonals both ways, eyes closed, eyes closed with head nods/turns/diagonals both ways. Min guard assist. No dizziness or other symptoms noted. No loss of balance  noted.  Climbing up 2 step step ladder - while on top step pt performed job simulated task with both hands: reaching down to retrieve clips and then reaching up to place them on top shelf, then bringing them back down. Head movements incorporated (up/down directions) and feet with narrowed base of support on top step. No increase in symptoms reported and min guard assist only needed with task.  Blue mat on ramp: performed both ways (facing up and down) - narrow base of support: eyes closed, eyes closed with head nods/shakes/diagonals both ways with min guard assist to occasionally needing min assist for midline position.  - tandem stance: eyes closes, eyes closed with head nods/shakes/diagonals both ways with each foot forward with min assist for balance and midline position during this activity.  At counter: each one performed with eyes open and with eyes closed with min guard assist to min assist for balance. - high knee marches forward/backward with head turns left/right - tandem gait forward/backwards x 1 lap each way - tandem gait forward/backwards with head turns left/right        PT Short Term Goals - 07/09/14 5176    PT SHORT TERM GOAL #1   Title The patient will be indep with HEP for balance, dizziness and general mobility.  Target date 07/12/2014   Baseline Met on 07/09/2014.   Time 4   Period Weeks   Status Achieved   PT SHORT TERM GOAL #2   Title The patient will improve  Berg score from 37/56 up to 44/56 to demo decreasing risk for falls. Target date 07/12/2014   Baseline Met on 07/09/2014 scoring 48/56.   Time 4   Period Weeks   Status Achieved   PT SHORT TERM GOAL #3   Title The patient will improve gait speed from 2.57 ft/sec up to 3.0 ft/sec to demo improving functional ambulation. Target date 07/12/2014   Baseline Met on 07/09/2014 with speed 3.54 ft/sec.   Time 4   Period Weeks   Status Achieved   PT SHORT TERM GOAL #4   Title The aptient will negotiate 4 steps without  handrail with step to pattern modified indep.  Target date 07/12/2014.   Baseline Goal met on 07/09/2014   Time 4   Period Weeks   Status Achieved   PT SHORT TERM GOAL #5   Title The patient will report baseline dizziness < or equal to 3/10 (currently 5/10). Target date 07/12/2014.   Baseline Improved to 4/10 on 07/09/2014.   Time 4   Period Weeks   Status Partially Met           PT Long Term Goals - 06/12/14 1417    PT LONG TERM GOAL #1   Title The patient will verbalize CVA warning signs/risk factors.  Target date 08/11/2014   Time 8   Period Weeks   PT LONG TERM GOAL #2   Title The patient will improve Berg score from 37/56 up to 48/56 to demo decreased risk for falls.  Target date 08/11/2014.   Time 8   Period Weeks   PT LONG TERM GOAL #3   Title The patient will improve gait speed from 2.57 ft/sec up to > or equal to 3.4 ft/sec to demo improving functional ambulation.  Target date 08/11/2014.   Time 8   Period Weeks   PT LONG TERM GOAL #4   Title The patient will negotiate 4 steps without a handrail with reciprocal pattern independently.  Target date 08/11/2014   Time 8   Period Weeks   PT LONG TERM GOAL #5   Title The patient will negotiate indoor and outdoor surfaces without a device independently to demo return to full community mobility.  Target date 08/11/2014   Time 8   Period Weeks           Plan - 07/12/14 0809    Clinical Impression Statement Pt continues to make progress toward goals. Pt reported no increase in symptoms with pball sitting or with step stool activities performed today.   Pt will benefit from skilled therapeutic intervention in order to improve on the following deficits Decreased activity tolerance;Abnormal gait;Decreased balance;Decreased mobility;Impaired vision/preception;Postural dysfunction;Difficulty walking   Rehab Potential Good   PT Frequency 2x / week   PT Duration 8 weeks   PT Treatment/Interventions Therapeutic activities;Patient/family  education;Therapeutic exercise;Balance training;Gait training;Stair training;Neuromuscular re-education;Functional mobility training   PT Next Visit Plan physioball sitting: include visual targets and reading cards on next session; begin simulating work activities Research officer, 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   Consulted and Agree with Plan of Care Patient      Problem List Patient Active Problem List   Diagnosis Date Noted  . 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 Essential hypertension   . CVA (cerebral infarction) 05/23/2014  . Basilar artery stenosis   . 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 Vertigo   . HLD (hyperlipidemia)   . 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  Intractable hiccups   . Stroke 05/20/2014  . Insufficiency of posterior brain circulation   . Rash and nonspecific skin eruption 04/10/2014  . Seborrheic keratoses 01/29/2014  . Skin irritation 09/06/2013  . Pain in joint, shoulder region 09/06/2013  . Nevus 08/26/2011  . FH: prostate cancer 08/26/2011  . Routine general medical examination at a health care facility 08/12/2010  . HYPERCHOLESTEROLEMIA 07/11/2007    Willow Ora 07/12/2014, 9:24 AM  Willow Ora, PTA, [facility] [address] [place], [place] [address] [phone] 07/12/2014, 9:24 AM

## 2014-07-12 NOTE — Progress Notes (Signed)
Subjective:    Patient ID: Alejandro Schneider, male    DOB: 04-21-1950, 64 y.o.   MRN: [id number] 64 year old right-handed male with history of hyperlipidemia, independent prior to admission, living with his wife, working for [employer].  Admitted May 20, 2014, with dizziness, facial weakness, and headache.  MRI of the head showed acute infarct, left posterolateral medulla.  MRA of the head with occluded left vertebral artery as well as free occlusive stenosis of the right vertebrobasilar junction and possibly the proximal basilar artery. Echocardiogram with ejection fraction 65% and grade 1 diastolic dysfunction.  CT angiogram of the head and neck again showed right vertebrobasilar junction and proximal basilar artery stenosis. Attempted stenting by Interventional Radiology was unsuccessful.  There were considerations being made for possible re-attempted stenting in the next few weeks.  Neurology followup maintained on aspirin and Plavix for DATE OF ADMISSION:  05/23/2014 DATE OF DISCHARGE:  05/31/2014  HPI   Modified independent in home environment with dressing and bathing. Going to outpatient PT and OT. No falls at home Traveling with friends to get out of the house. Patient has some numbness and tingling in the right arm and right leg. Does feel like sensation is coming back a little bit. Left facial numbness is also improving Decreased temperature sensation noted in the right arm and leg No swallowing issues  Still has sensation that eyes jump around. This comes and goes. Better first thing in the morning.  Pain Inventory Average Pain 2 Pain Right Now 2 My pain is intermittent, tingling and aching  In the last 24 hours, has pain interfered with the following? General activity 0 Relation with others 0 Enjoyment of life 0 What TIME of day is your pain at its worst? daytime Sleep (in general) Good  Pain is worse with: some activites Pain improves with:  therapy/exercise, injections and keeping active Relief from Meds: 0  Mobility walk without assistance how many minutes can you walk? 30 ability to climb steps?  yes do you drive?  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  no  Physicians involved in your care Any changes since last visit?  no   Family History  Problem Relation Age of Onset  . Heart disease Father     CABGx4  . Cancer Father     Prostate (Radiation)  . Prostate cancer Father   . Heart disease Paternal Grandfather     MI  . Hypertension Neg Hx   . Drug abuse Neg Hx   . Alcohol abuse Neg Hx   . Stroke Neg Hx   . Colon cancer Neg Hx   . Diabetes Cousin   . Depression Mother    History   Social History  . Marital Status: Married    Spouse Name: N/A  . Number of Children: 1  . Years of Education: N/A   Occupational History  . Lineman [employer]    Social History Main Topics  . Smoking status: Never Smoker   . Smokeless tobacco: Never Used  . Alcohol Use: 0.0 oz/week     Comment: rarely  . Drug Use: No  . Sexual Activity: Yes   Other Topics Concern  . 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 Tonsillectomy  1972  . Inguinal hernia repair  10/16/02    Right  Dr Hassell Done  . Colonoscopy  2006  . Lumbar laminectomy/decompression microdiscectomy Right 05/30/2012    Procedure: LUMBAR LAMINECTOMY/DECOMPRESSION MICRODISCECTOMY 1 LEVEL;  Surgeon: Faythe Ghee, MD;  Location: [facility];  Service: Neurosurgery;  Laterality: Right;  . Radiology with anesthesia N/A 05/21/2014    Procedure: ANGIOPLASTY POSSIBLE STENT;  Surgeon: Rob Hickman, MD;  Location: [facility];  Service: Radiology;  Laterality: N/A;   Past Medical History    Diagnosis Date  . Hyperlipidemia   . CVA (cerebral infarction)    BP 144/86 mmHg  Pulse 60  Resp 14  SpO2 99%  Opioid Risk Score:   Fall Risk Score:  `1  Depression screen PHQ 2/9  Depression screen PHQ 2/9 07/12/2014  Decreased Interest 0  Down, Depressed, Hopeless 0  PHQ - 2 Score 0  Altered sleeping 0  Tired, decreased energy 1  Change in appetite 0  Feeling bad or failure about yourself  0  Trouble concentrating 0  Moving slowly or fidgety/restless 0  Suicidal thoughts 0  PHQ-9 Score 1     Review of Systems  Constitutional: Negative.   HENT: Negative.   Eyes: Negative.   Respiratory: Negative.   Cardiovascular: Negative.   Gastrointestinal: Negative.   Endocrine: Negative.   Genitourinary: Negative.   Musculoskeletal:       Right hand pain & tingling  Skin: Negative.   Allergic/Immunologic: Negative.   Neurological:       Tingling  Hematological: Negative.   Psychiatric/Behavioral: Negative.        Objective:   Physical Exam  Reduced pinprick left cheek as well as right arm Nystagmus horizontal to right side as well as vertical nystagmus with upward gaze Extraocular muscles are intact no diplopia  Mood and affect are normal Voice has normal volume and no evidence of dysarthria  Neuro:  Tone is normal without evidence of spasticity Cerebellar exam shows mild evidence of ataxia on finger nose finger LUE No evidence of trunkal ataxia  Motor strength is 5/5 in bilateral deltoid, biceps, triceps, finger flexors and extensors, wrist flexors and extensors, hip flexors, knee flexors and extensors, ankle dorsiflexors, plantar flexors, invertors and evertors, toe flexors and extensors  Sensory exam is normal to pinprick, proprioception and light touch in the upper and lower limbs   Cranial nerves II- Visual fields are intact to confrontation testing, no blurring of vision III- no evidence of ptosis, upward, downward and medial gaze intact IV- no  vertical diplopia or head tilt V- Left facial numbness  VI- no pupil abduction weakness VII- no facial droop, good lid closure VII- normal auditory acuity IX- no hoarseness X- no pharyngeal weakness, no hoarseness XI- no trap or SCM weakness XII- no glossal weakness  Positive Romberg      Assessment & Plan:   1. 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 I recommend he continue PT and OT as an outpatient I recommend no driving at this point He may try using his lawn tractor but not on inclined surfaces, his wife will be home when he does this and watch him. 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

## 2014-07-12 NOTE — Patient Instructions (Signed)
May start on riding lawnmower  May do putting  and chipping, ask neurology about resuming golf

## 2014-07-15 ENCOUNTER — Ambulatory Visit (HOSPITAL_COMMUNITY)
Admission: RE | Admit: 2014-07-15 | Discharge: 2014-07-15 | Disposition: A | Discharge status: Home / Self Care | Payer: 59 | Source: Ambulatory Visit | Attending: Interventional Radiology | Admitting: Interventional Radiology

## 2014-07-15 ENCOUNTER — Ambulatory Visit: Payer: 59 | Admitting: Rehabilitative and Restorative Service Providers"

## 2014-07-15 DIAGNOSIS — H8149 Vertigo of central origin, unspecified ear: Secondary | ICD-10-CM

## 2014-07-15 DIAGNOSIS — I771 Stricture of artery: Secondary | ICD-10-CM

## 2014-07-15 DIAGNOSIS — R269 Unspecified abnormalities of gait and mobility: Secondary | ICD-10-CM | POA: Diagnosis not present

## 2014-07-15 NOTE — Patient Instructions (Signed)
Turning in Place: Solid Surface   Standing in place, lead with head and turn slowly making quarter turns toward left. Repeat __3-5__ times per session. Do _2___ sessions per day.  Copyright  VHI. All rights reserved.

## 2014-07-15 NOTE — Therapy (Signed)
[facility] [address] [place], [place], [address] Phone: [phone]   Fax:  [phone]  Physical Therapy Treatment  Patient Details  Name: Alejandro Schneider MRN: [id number] Date of Birth: [birth year] Referring Provider:  Joaquim Nam, MD  Encounter Date: 07/15/2014      PT End of Session - 07/15/14 1333    Visit Number 10   Number of Visits 16   Date for PT Re-Evaluation 08/11/14   PT Start Time 0803   PT Stop Time 0845   PT Time Calculation (min) 42 min   Equipment Utilized During Treatment Gait belt   Activity Tolerance Patient tolerated treatment well   Behavior During Therapy [facility] for tasks assessed/performed      Past Medical History  Diagnosis Date  . Hyperlipidemia   . CVA (cerebral infarction)     Past Surgical History  Procedure Laterality Date  . Tonsillectomy  1972  . Inguinal hernia repair  10/16/02    Right  Dr Daphine Deutscher  . Colonoscopy  2006  . Lumbar laminectomy/decompression microdiscectomy Right 05/30/2012    Procedure: LUMBAR LAMINECTOMY/DECOMPRESSION MICRODISCECTOMY 1 LEVEL;  Surgeon: Reinaldo Meeker, MD;  Location: [facility];  Service: Neurosurgery;  Laterality: Right;  . Radiology with anesthesia N/A 05/21/2014    Procedure: ANGIOPLASTY POSSIBLE STENT;  Surgeon: Oneal Grout, MD;  Location: [facility];  Service: Radiology;  Laterality: N/A;    There were no vitals filed for this visit.  Visit Diagnosis:  Abnormality of gait  Central vestibular vertigo, unspecified laterality      Subjective Assessment - 07/15/14 0803    Subjective Dizziness 3/10 since getting out of bed.     Currently in Pain? No/denies      NEUROMUSCULAR RE-EDUCATION: Habituation exercises with sit<>bilateral sidelying with dizziness mild upon rising Head turns standing on foam with diagonal and multi-direction changes 360 degree turns x 1 rep with alternating LE foot taps, then consecutively x 5 reps  with dizziness moderate to severe.  Recommended perform 3x for HEP and then let symptoms settle Ramp standing with gaze x 1 viewing Seated eye exercises including convergence, saccades, smooth pursuits provoking mild symptoms  Gait: Dynamic gait activities with ball toss, spotting a ball with clockwise and counterclockwise motion while walking       PT Short Term Goals - 07/09/14 9518    PT SHORT TERM GOAL #1   Title The patient will be indep with HEP for balance, dizziness and general mobility.  Target date 07/12/2014   Baseline Met on 07/09/2014.   Time 4   Period Weeks   Status Achieved   PT SHORT TERM GOAL #2   Title The patient will improve Berg score from 37/56 up to 44/56 to demo decreasing risk for falls. Target date 07/12/2014   Baseline Met on 07/09/2014 scoring 48/56.   Time 4   Period Weeks   Status Achieved   PT SHORT TERM GOAL #3   Title The patient will improve gait speed from 2.57 ft/sec up to 3.0 ft/sec to demo improving functional ambulation. Target date 07/12/2014   Baseline Met on 07/09/2014 with speed 3.54 ft/sec.   Time 4   Period Weeks   Status Achieved   PT SHORT TERM GOAL #4   Title The aptient will negotiate 4 steps without handrail with step to pattern modified indep.  Target date 07/12/2014.   Baseline Goal met on 07/09/2014   Time 4   Period Weeks  Status Achieved   PT SHORT TERM GOAL #5   Title The patient will report baseline dizziness < or equal to 3/10 (currently 5/10). Target date 07/12/2014.   Baseline Improved to 4/10 on 07/09/2014.   Time 4   Period Weeks   Status Partially Met           PT Long Term Goals - 06/12/14 1417    PT LONG TERM GOAL #1   Title The patient will verbalize CVA warning signs/risk factors.  Target date 08/11/2014   Time 8   Period Weeks   PT LONG TERM GOAL #2   Title The patient will improve Berg score from 37/56 up to 48/56 to demo decreased risk for falls.  Target date 08/11/2014.   Time 8   Period Weeks   PT LONG TERM GOAL  #3   Title The patient will improve gait speed from 2.57 ft/sec up to > or equal to 3.4 ft/sec to demo improving functional ambulation.  Target date 08/11/2014.   Time 8   Period Weeks   PT LONG TERM GOAL #4   Title The patient will negotiate 4 steps without a handrail with reciprocal pattern independently.  Target date 08/11/2014   Time 8   Period Weeks   PT LONG TERM GOAL #5   Title The patient will negotiate indoor and outdoor surfaces without a device independently to demo return to full community mobility.  Target date 08/11/2014   Time 8   Period Weeks               Plan - 07/15/14 1334    Clinical Impression Statement The patient reports dizziness mildly improved since last week.  PT is progressing activities keeping work and recreational activities in mind (wants to return to climbing ladders/lines for [employer] and ride motorcycle).   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 Essential hypertension   . CVA (cerebral infarction) 05/23/2014  . Basilar artery stenosis   . 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 Vertigo   . HLD (hyperlipidemia)   . 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 Intractable hiccups   . Stroke 05/20/2014  . Insufficiency of posterior brain circulation   . Rash and nonspecific skin eruption 04/10/2014  . Seborrheic keratoses 01/29/2014  . Skin irritation 09/06/2013  . Pain in joint, shoulder region 09/06/2013  . Nevus 08/26/2011  . FH: prostate cancer 08/26/2011  . Routine general medical examination at a health care facility  08/12/2010  . HYPERCHOLESTEROLEMIA 07/11/2007    Rexanna Louthan, PT 07/15/2014, 1:36 PM  [facility] [address] [place], [place], [address] Phone: [phone]   Fax:  [phone]

## 2014-07-17 ENCOUNTER — Ambulatory Visit: Payer: 59

## 2014-07-17 DIAGNOSIS — R269 Unspecified abnormalities of gait and mobility: Secondary | ICD-10-CM | POA: Diagnosis not present

## 2014-07-17 DIAGNOSIS — H8149 Vertigo of central origin, unspecified ear: Secondary | ICD-10-CM

## 2014-07-17 NOTE — Therapy (Signed)
[facility] [address] [place], [place], [address] Phone: [phone]   Fax:  [phone]  Physical Therapy Treatment  Patient Details  Name: Alejandro Schneider MRN: [id number] Date of Birth: 04-01-1951 Referring Provider:  Tonia Ghent, MD  Encounter Date: 07/17/2014      PT End of Session - 07/17/14 1443    Visit Number 11   Number of Visits 16   Date for PT Re-Evaluation 08/11/14   PT Start Time 1245  Pt arrived late due to tranportation changes. He called in advance to inform us   PT Stop Time 1315   PT Time Calculation (min) 30 min      Past Medical History  Diagnosis Date  . Hyperlipidemia   . CVA (cerebral infarction)     Past Surgical History  Procedure Laterality Date  . Tonsillectomy  1972  . Inguinal hernia repair  10/16/02    Right  Dr Hassell Done  . Colonoscopy  2006  . Lumbar laminectomy/decompression microdiscectomy Right 05/30/2012    Procedure: LUMBAR LAMINECTOMY/DECOMPRESSION MICRODISCECTOMY 1 LEVEL;  Surgeon: Faythe Ghee, MD;  Location: [facility];  Service: Neurosurgery;  Laterality: Right;  . Radiology with anesthesia N/A 05/21/2014    Procedure: ANGIOPLASTY POSSIBLE STENT;  Surgeon: Rob Hickman, MD;  Location: [facility];  Service: Radiology;  Laterality: N/A;    There were no vitals filed for this visit.  Visit Diagnosis:  Central vestibular vertigo, unspecified laterality  Abnormality of gait      Subjective Assessment - 07/17/14 1442    Subjective no falls, no new complaints   Currently in Pain? No/denies       Pt performed various activities to improve postural righting while seated on physioball -first with BLE on solid surface with eyes closed and head turns -progressed to BLE on small ball: postural righting without other stimulation, then with eyes open and head turns, then eyes closed and head turns with MIN to MOD A to steady intermittently -downgraded to BLE  on BOSU ball with visual scanning task using a moving and changing target with pt naming the numbers and colors shown on the target.  Ambulating on solid surface while tossing ball upwards and catching it while maintaining eye contact on red markers on ball.   Standing star drill on bosu ball with MIN A to steady without UE support,\  Standing on BOSU ball with upward ball toss and catch with eyes focused on red markers on ball MIN A to steady.                          PT Short Term Goals - 07/09/14 5537    PT SHORT TERM GOAL #1   Title The patient will be indep with HEP for balance, dizziness and general mobility.  Target date 07/12/2014   Baseline Met on 07/09/2014.   Time 4   Period Weeks   Status Achieved   PT SHORT TERM GOAL #2   Title The patient will improve Berg score from 37/56 up to 44/56 to demo decreasing risk for falls. Target date 07/12/2014   Baseline Met on 07/09/2014 scoring 48/56.   Time 4   Period Weeks   Status Achieved   PT SHORT TERM GOAL #3   Title The patient will improve gait speed from 2.57 ft/sec up to 3.0 ft/sec to demo improving functional ambulation. Target date 07/12/2014   Baseline Met on 07/09/2014 with speed  3.54 ft/sec.   Time 4   Period Weeks   Status Achieved   PT SHORT TERM GOAL #4   Title The aptient will negotiate 4 steps without handrail with step to pattern modified indep.  Target date 07/12/2014.   Baseline Goal met on 07/09/2014   Time 4   Period Weeks   Status Achieved   PT SHORT TERM GOAL #5   Title The patient will report baseline dizziness < or equal to 3/10 (currently 5/10). Target date 07/12/2014.   Baseline Improved to 4/10 on 07/09/2014.   Time 4   Period Weeks   Status Partially Met           PT Long Term Goals - 06/12/14 1417    PT LONG TERM GOAL #1   Title The patient will verbalize CVA warning signs/risk factors.  Target date 08/11/2014   Time 8   Period Weeks   PT LONG TERM GOAL #2   Title The patient will  improve Berg score from 37/56 up to 48/56 to demo decreased risk for falls.  Target date 08/11/2014.   Time 8   Period Weeks   PT LONG TERM GOAL #3   Title The patient will improve gait speed from 2.57 ft/sec up to > or equal to 3.4 ft/sec to demo improving functional ambulation.  Target date 08/11/2014.   Time 8   Period Weeks   PT LONG TERM GOAL #4   Title The patient will negotiate 4 steps without a handrail with reciprocal pattern independently.  Target date 08/11/2014   Time 8   Period Weeks   PT LONG TERM GOAL #5   Title The patient will negotiate indoor and outdoor surfaces without a device independently to demo return to full community mobility.  Target date 08/11/2014   Time 8   Period Weeks               Plan - 07/17/14 1444    Clinical Impression Statement Pt appears to be making progress with balance and ability to right himself. Continue per POC.   PT Next Visit Plan Continue righting reactions on dynamic surfaces with visual challenges        Problem List Patient Active Problem List   Diagnosis Date Noted  . 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 Essential hypertension   . CVA (cerebral infarction) 05/23/2014  . Basilar artery stenosis   . 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 Vertigo   . HLD (hyperlipidemia)   . 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 Intractable hiccups   . Stroke 05/20/2014  . Insufficiency of posterior brain circulation   . Rash and nonspecific skin eruption 04/10/2014  . Seborrheic keratoses 01/29/2014  . Skin irritation 09/06/2013  . Pain in joint, shoulder region 09/06/2013  . Nevus 08/26/2011  . FH: prostate cancer 08/26/2011  . Routine general medical examination at a health care facility 08/12/2010  . HYPERCHOLESTEROLEMIA 07/11/2007   Delrae Sawyers, PT,DPT,NCS 07/17/2014 7:27 PM Phone [phone] FAX [phone]          [facility] [address] [place], [place], [address] Phone: [phone]   Fax:  [phone]

## 2014-07-22 ENCOUNTER — Ambulatory Visit: Payer: 59 | Admitting: Physical Therapy

## 2014-07-22 ENCOUNTER — Encounter: Payer: Self-pay | Admitting: Physical Therapy

## 2014-07-22 ENCOUNTER — Encounter: Payer: Self-pay | Admitting: Neurology

## 2014-07-22 ENCOUNTER — Ambulatory Visit (INDEPENDENT_AMBULATORY_CARE_PROVIDER_SITE_OTHER): Payer: 59 | Admitting: Neurology

## 2014-07-22 VITALS — BP 146/83 | HR 73 | Ht 70.0 in | Wt 201.8 lb

## 2014-07-22 DIAGNOSIS — I999 Unspecified disorder of circulatory system: Secondary | ICD-10-CM | POA: Diagnosis not present

## 2014-07-22 DIAGNOSIS — I651 Occlusion and stenosis of basilar artery: Secondary | ICD-10-CM

## 2014-07-22 DIAGNOSIS — I10 Essential (primary) hypertension: Secondary | ICD-10-CM | POA: Diagnosis not present

## 2014-07-22 DIAGNOSIS — I998 Other disorder of circulatory system: Secondary | ICD-10-CM

## 2014-07-22 DIAGNOSIS — E785 Hyperlipidemia, unspecified: Secondary | ICD-10-CM

## 2014-07-22 DIAGNOSIS — I63011 Cerebral infarction due to thrombosis of right vertebral artery: Secondary | ICD-10-CM

## 2014-07-22 DIAGNOSIS — R269 Unspecified abnormalities of gait and mobility: Secondary | ICD-10-CM | POA: Diagnosis not present

## 2014-07-22 DIAGNOSIS — H8149 Vertigo of central origin, unspecified ear: Secondary | ICD-10-CM

## 2014-07-22 NOTE — Progress Notes (Signed)
STROKE NEUROLOGY FOLLOW UP NOTE  NAME: Alejandro Schneider DOB: 1951-03-26  REASON FOR VISIT: stroke follow up HISTORY FROM: pt and chart  Today we had the pleasure of seeing Alejandro Schneider in follow-up at our Neurology Clinic. Pt was accompanied by wife.   History Summary Alejandro Schneider is a 64 y.o. male with hx of HLD admitted for acute onset dizziness, off balance, left face numbness, and vomiting. Symptoms gradually improved. A CT of the head in the emergency department showed no acute changes. 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 Cerebral angiogram demonstrated an occluded left vertebral artery in its entirety as well as pre-occlusive stenosis of the right vertebrobasilar junction and possibly the proximal BA. MRI showed left lateral medullary infarct, consistent with Wallenberg Syndrome. He did feel left face and right UE and LE numbness and tingling. TCD showed antegrade flow of BA. The patient was taken back to interventional radiology again on 05/21/2014 for possible posterior circulation stenting however no intervention was performed at that time due to high risk of vessel rupture. The pt developed intractable hiccups which were treated with baclofen, Thorazine, and later Reglan. He was placed on dual antiplatelet therapy for his cerebrovascular disease. He was placed on Lipitor for an elevated LDL. It was felt that his systolic blood pressure should remain in the 130 to 150 range for at least 2-3 weeks to avoid posterior circulation hypoperfusion.   Interval History During the interval time, the patient has been doing well and he was discharge to home from CIR. BP today 146/83. He did not check Bp at home. He still has nystagmus and he felt difficult eye focusing due to nystagmus. Hiccup has been resolved. He continues on ASA and plavix now. No side effects.      REVIEW OF SYSTEMS: Full 14 system review of systems performed and notable only for those listed below and in HPI above, all others are negative:  Constitutional:   Cardiovascular:  Ear/Nose/Throat:   Skin:  Eyes:   Respiratory:   Gastroitestinal:   Genitourinary:  Hematology/Lymphatic:   Endocrine:  Musculoskeletal:   Allergy/Immunology:   Neurological:  dizziness Psychiatric:  Sleep:   The following represents the patient's updated allergies and side effects list: Allergies  Allergen Reactions  . Bee Venom     S/p allergy shots (and no reaction with stings after shots)  . Gabapentin     Nausea, altered mental status  . Lipitor [Atorvastatin] Other (See Comments)    myalgias    The neurologically relevant items on the patient's problem list were reviewed on today's visit.  Neurologic Examination  A problem focused neurological exam (12 or more points of the single system neurologic examination, vital signs counts as 1 point, cranial nerves count for 8 points) was performed.  Blood pressure 146/83, pulse 73, height 5\' 10"  (1.778 m), weight 201 lb 12.8 oz (91.536 kg).  General - Well nourished, well developed, in no apparent distress.  Ophthalmologic - Sharp disc margins OU.  Cardiovascular - Regular rate and rhythm with no murmur.  Mental Status -  Level of arousal and orientation to time, place, and person were intact. Language including expression, naming, repetition, comprehension was assessed and found intact. Fund of Knowledge was assessed and was intact.  Cranial Nerves II - XII - II - Visual field intact OU. III, IV, VI - Extraocular movements intact. Left  pupil 2.16mm and right pupil 87mm, both reactive to light. Left mild eyelid droop.  V - Facial sensation intact bilaterally. VII - Facial movement intact bilaterally. VIII - Hearing & vestibular intact bilaterally, right gaze showed mild nystagmus towards right. X - Palate elevates symmetrically. XI -  Chin turning & shoulder shrug intact bilaterally. XII - Tongue protrusion intact.  Motor Strength - The patient's strength was normal in all extremities and pronator drift was absent.  Bulk was normal and fasciculations were absent.   Motor Tone - Muscle tone was assessed at the neck and appendages and was normal.  Reflexes - The patient's reflexes were normal in all extremities and he had no pathological reflexes.  Sensory - Light touch, temperature/pinprick, and Romberg testing were assessed and were normal.    Coordination - The patient had normal movements in the hands and feet with no ataxia or dysmetria.  Tremor was absent.  Gait and Station - The patient's transfers, posture, gait, station, and turns were observed as normal.  Data reviewed: I personally reviewed the images and agree with the radiology interpretations.  Ct Angio Head and neck W/cm &/or Wo Cm  05/20/2014 IMPRESSION: Occluded LEFT vertebral artery, with minimal reconstitution within the foraminal segments. Occlusion of the distal LEFT V3 segment through the intracranial level. 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 However, there is very regular distal RIGHT V4 segment and, apparent included RIGHT posterior inferior cerebellar artery. High-grade stenosis versus occluded proximal basilar artery, small amount of contrast in the mid to distal basilar artery may represent retrograde flow, complete circle of Willis noted. Moderately irregular intracranial vessels suggests atherosclerosis, less likely vasculopathy. Patent posterior cerebral arteries though, less robust flow related enhancement of the LEFT mid to distal PCA segments.   Ct Head (brain) Wo Contrast  05/20/2014 IMPRESSION: Equivocal hyperdense right MCA. No intracranial hemorrhage. Otherwise normal  brain.   Mri and Mra Head Wo Contrast  05/20/2014 IMPRESSION: MRI HEAD: No acute intracranial process ; normal noncontrast MRI of the brain for age. 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 MRA HEAD: 3 mm laterally directed outpouching of LEFT cavernous carotid artery segments, considering proximity to the supraclinoid artery, this is less likely a para ophthalmic aneurysm. LEFT vertebral artery occlusion. In addition, thready irregular distal RIGHT vertebral artery and high-grade stenosis of the proximal basilar artery. 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 Constellation of findings would be better characterized on CT angiogram of the head and neck.   [name] W/o Cm  05/20/2014 IMPRESSION: Acute infarct left posterior lateral medulla.   Ir Angiogram  05/20/2014 IMPRESSION: Occluded left vertebral artery in its entirety. 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 Antegrade flow is seen in the basilar artery to the level of the superior cerebellar arteries. Both posterior communicating arteries open.   05/21/14 - 1.Severe preocclusive stenosis of pro basilar artery, and RT ABJ. 2.Retrograde opacification of LT VBJ from hypoplastic RT VA  TCD - right vert and BA antegrade flow  2D Echocardiogram - Left ventricle: The cavity size was normal. Wall thickness was normal. Systolic function was normal. The estimated ejection fraction was in the range of 60% to 65%. Doppler parameters are consistent with abnormal left ventricular relaxation (grade 1 diastolic dysfunction).  Assessment: As you may recall, he  is a 64 y.o. Caucasian male with PMH of HLD was admitted on 05/20/14 due to left lateral medullary infarct (Wallenberg syndrome) on MRI. His CTA and MRA and TCD confirmed left VA occlusion with high grade stenosis  of proximal BA and distal right VA, antegrade flow. Put on dural antiplatelet. Continued zocor for HLD. Hiccup resolved. He still has right nystagmus on right lateral gaze. Subtle right honor's sign. BP not at goal, need check BP at home, goal 120-140.   Plan:  - continue dural antiplatelet for total 3 months and then plavix alone - continue zocor for stroke prevention - Follow up with your primary care physician for stroke risk factor modification. Recommend maintain blood pressure goal <130/80, diabetes with hemoglobin A1c goal below 6.5% and lipids with LDL cholesterol goal below 70 mg/dL.  - check BP at home - continue PT/OT at home. - RTC in 3 months  No orders of the defined types were placed in this encounter.    No orders of the defined types were placed in this encounter.    Patient Instructions  - continue ASA and plavix for another one month and then plavix alone - continue zocor for stroke prevention - Follow up with your primary care physician for stroke risk factor modification. Recommend maintain blood pressure goal <130/80, diabetes with hemoglobin A1c goal below 6.5% and lipids with LDL cholesterol goal below 70 mg/dL.  - check BP at home and record and bring to over to PCP for medication adjustment.  - regular exercise  - follow up in 3 months.   Rosalin Hawking, MD PhD [facility] [address] [place], [place] [address] [phone]

## 2014-07-22 NOTE — Patient Instructions (Addendum)
-   continue ASA and plavix for another one month and then plavix alone - continue zocor for stroke prevention - Follow up with your primary care physician for stroke risk factor modification. Recommend maintain blood pressure goal <130/80, diabetes with hemoglobin A1c goal below 6.5% and lipids with LDL cholesterol goal below 70 mg/dL.  - check BP at home and record and bring to over to PCP for medication adjustment.  - regular exercise  - follow up in 3 months.

## 2014-07-22 NOTE — Therapy (Signed)
[facility] [address] [place], [place], [address] Phone: [phone]   Fax:  [phone]  Physical Therapy Treatment  Patient Details  Name: Alejandro Schneider MRN: [id number] Date of Birth: 1950/10/29 Referring Provider:  Tonia Ghent, MD  Encounter Date: 07/22/2014      PT End of Session - 07/22/14 0935    Visit Number 12   Number of Visits 16   Date for PT Re-Evaluation 08/11/14   PT Start Time 0930   PT Stop Time 1010   PT Time Calculation (min) 40 min      Past Medical History  Diagnosis Date  . Hyperlipidemia   . CVA (cerebral infarction)     Past Surgical History  Procedure Laterality Date  . Tonsillectomy  1972  . Inguinal hernia repair  10/16/02    Right  Dr [facility] Done  . Colonoscopy  2006  . Lumbar laminectomy/decompression microdiscectomy Right 05/30/2012    Procedure: LUMBAR LAMINECTOMY/DECOMPRESSION MICRODISCECTOMY 1 LEVEL;  Surgeon: Faythe Ghee, MD;  Location: [facility];  Service: Neurosurgery;  Laterality: Right;  . Radiology with anesthesia N/A 05/21/2014    Procedure: ANGIOPLASTY POSSIBLE STENT;  Surgeon: [facility], MD;  Location: [facility];  Service: Radiology;  Laterality: N/A;    There were no vitals filed for this visit.  Visit Diagnosis:  Central vestibular vertigo, unspecified laterality  Abnormality of gait      Subjective Assessment - 07/22/14 0934    Subjective No new complaints. No falls or pain to report.   Currently in Pain? No/denies   Pain Score 0-No pain     Treatment: Habituation sit<>sidelying x 5 each way with mild dizziness reported.  Seated on large pball - bouncing with alternating card reading at various places to each side - sitting on large ball with small ball underneath feet, emphasis on posture and midline hold position. Pt abel to self hold ~5 seconds at a time without assist - sitting on large ball with small ball underneath feet, min  assist with head turns left/right, nods up/down and movement in diagonal both ways x 10 each - sitting on large ball with feet on small foam bubbles: overhead raise with ball to floor center, up to floor right, up to floor left x 6 each position in this sequence - sitting on large ball with feet on small foam bubbles: fast bouncing with playing card reading left<>right at varied heights  In corner - alternating foot taps with fast 360 degree turns. 3 sets of 5 each way. Moderate dizziness reported. - standing on gray foam: bending over laterally to retrieve bean bag from floor and coming up to place it in basket positioned center forward, alternating sides x 2 complete cycles with all the bags. Moderate dizziness reported.  Dynamic gait: - 345 feet with self ball toss, tracking the ball up/down with eyes, min guard assist. Minimal dizziness reported. - up/down hallway with quick turns either left or right, randomly gave pt a direction to turn, and keep going, x 6 laps with minimal dizziness reported. - up/down hallway scanning left/right with random quick turns left/right, with no stopping after turn/gait kept going, x 2 laps. Moderate dizziness reported. - up/down hallway scanning up/down with random quick turns left/right with no stopping after turns, x 2 laps. Minimal dizziness reported.         PT Short Term Goals - 07/09/14 0923    PT SHORT TERM GOAL #1   Title The  patient will be indep with HEP for balance, dizziness and general mobility.  Target date 07/12/2014   Baseline Met on 07/09/2014.   Time 4   Period Weeks   Status Achieved   PT SHORT TERM GOAL #2   Title The patient will improve Berg score from 37/56 up to 44/56 to demo decreasing risk for falls. Target date 07/12/2014   Baseline Met on 07/09/2014 scoring 48/56.   Time 4   Period Weeks   Status Achieved   PT SHORT TERM GOAL #3   Title The patient will improve gait speed from 2.57 ft/sec up to 3.0 ft/sec to demo improving  functional ambulation. Target date 07/12/2014   Baseline Met on 07/09/2014 with speed 3.54 ft/sec.   Time 4   Period Weeks   Status Achieved   PT SHORT TERM GOAL #4   Title The aptient will negotiate 4 steps without handrail with step to pattern modified indep.  Target date 07/12/2014.   Baseline Goal met on 07/09/2014   Time 4   Period Weeks   Status Achieved   PT SHORT TERM GOAL #5   Title The patient will report baseline dizziness < or equal to 3/10 (currently 5/10). Target date 07/12/2014.   Baseline Improved to 4/10 on 07/09/2014.   Time 4   Period Weeks   Status Partially Met           PT Long Term Goals - 06/12/14 1417    PT LONG TERM GOAL #1   Title The patient will verbalize CVA warning signs/risk factors.  Target date 08/11/2014   Time 8   Period Weeks   PT LONG TERM GOAL #2   Title The patient will improve Berg score from 37/56 up to 48/56 to demo decreased risk for falls.  Target date 08/11/2014.   Time 8   Period Weeks   PT LONG TERM GOAL #3   Title The patient will improve gait speed from 2.57 ft/sec up to > or equal to 3.4 ft/sec to demo improving functional ambulation.  Target date 08/11/2014.   Time 8   Period Weeks   PT LONG TERM GOAL #4   Title The patient will negotiate 4 steps without a handrail with reciprocal pattern independently.  Target date 08/11/2014   Time 8   Period Weeks   PT LONG TERM GOAL #5   Title The patient will negotiate indoor and outdoor surfaces without a device independently to demo return to full community mobility.  Target date 08/11/2014   Time 8   Period Weeks           Plan - 07/22/14 0935    Pt will benefit from skilled therapeutic intervention in order to improve on the following deficits Decreased activity tolerance;Abnormal gait;Decreased balance;Decreased mobility;Impaired vision/preception;Postural dysfunction;Difficulty walking   Rehab Potential Good   PT Frequency 2x / week   PT Duration 8 weeks   PT Treatment/Interventions  Therapeutic activities;Patient/family education;Therapeutic exercise;Balance training;Gait training;Stair training;Neuromuscular re-education;Functional mobility training   Consulted and Agree with Plan of Care Patient        Problem List Patient Active Problem List   Diagnosis Date Noted  . 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 Essential hypertension   . CVA (cerebral infarction) 05/23/2014  . Basilar artery stenosis   . 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 Vertigo   . HLD (hyperlipidemia)   . Cerebral infarction due to thrombosis of left vertebral  artery   . Intractable hiccups   . Stroke 05/20/2014  . Insufficiency of posterior brain circulation   . Rash and nonspecific skin eruption 04/10/2014  . Seborrheic keratoses 01/29/2014  . Skin irritation 09/06/2013  . Pain in joint, shoulder region 09/06/2013  . Nevus 08/26/2011  . FH: prostate cancer 08/26/2011  . Routine general medical examination at a health care facility 08/12/2010  . HYPERCHOLESTEROLEMIA 07/11/2007    Willow Ora 07/22/2014, 2:12 PM  Willow Ora, PTA, [facility] [address] [employer], [place] [address] [phone] 07/22/2014, 2:12 PM

## 2014-07-24 ENCOUNTER — Encounter: Payer: Self-pay | Admitting: Physical Therapy

## 2014-07-24 ENCOUNTER — Ambulatory Visit: Payer: 59 | Admitting: Physical Therapy

## 2014-07-24 DIAGNOSIS — H8149 Vertigo of central origin, unspecified ear: Secondary | ICD-10-CM

## 2014-07-24 DIAGNOSIS — R269 Unspecified abnormalities of gait and mobility: Secondary | ICD-10-CM | POA: Diagnosis not present

## 2014-07-24 NOTE — Therapy (Signed)
[facility] [address] [place], [place], [address] Phone: [phone]   Fax:  [phone]  Physical Therapy Treatment  Patient Details  Name: Alejandro Schneider MRN: [id number] Date of Birth: [birth year] Referring Provider:  Tonia Ghent, MD  Encounter Date: 07/24/2014      PT End of Session - 07/24/14 1433    Visit Number 13   Number of Visits 16   Date for PT Re-Evaluation 08/11/14   PT Start Time 0802   PT Stop Time 0844   PT Time Calculation (min) 42 min   Equipment Utilized During Treatment Gait belt   Activity Tolerance Patient tolerated treatment well   Behavior During Therapy [facility] for tasks assessed/performed      Past Medical History  Diagnosis Date  . Hyperlipidemia   . CVA (cerebral infarction)     Past Surgical History  Procedure Laterality Date  . Tonsillectomy  1972  . Inguinal hernia repair  10/16/02    Right  Dr [facility] Done  . Colonoscopy  2006  . Lumbar laminectomy/decompression microdiscectomy Right 05/30/2012    Procedure: LUMBAR LAMINECTOMY/DECOMPRESSION MICRODISCECTOMY 1 LEVEL;  Surgeon: Faythe Ghee, MD;  Location: [facility];  Service: Neurosurgery;  Laterality: Right;  . Radiology with anesthesia N/A 05/21/2014    Procedure: ANGIOPLASTY POSSIBLE STENT;  Surgeon: [facility], MD;  Location: [facility];  Service: Radiology;  Laterality: N/A;    There were no vitals filed for this visit.  Visit Diagnosis:  Central vestibular vertigo, unspecified laterality  Abnormality of gait      Subjective Assessment - 07/24/14 1433    Subjective No new complaints. No falls or pain to report.   Currently in Pain? No/denies   Pain Score 0-No pain      Seated on large pball - bouncing with alternating card reading at various places left/ right using small cards - sitting on large ball with small ball underneath feet, emphasis on posture and midline hold position. Pt ablel to self hold ~5  seconds at a time without assist - sitting on large ball with small ball underneath feet, min assist with head turns left/right, nods up/down and movement in diagonal both ways x 10 each   In corner - alternating foot taps with fast 360 degree turns. 3 sets of 5 each way. Moderate to minimal dizziness reported. - quick turns in alternating directions with ball toss back/forth before each turn using 2# weighted ball x 6 each way. Minimal dizziness reported - standing on gray foam: bending over laterally to retrieve bean bag from floor and coming up to place it in basket positioned center forward, alternating sides x 2 complete cycles with all the bags. Moderate to minimal dizziness reported.  Dynamic gait:. - up/down hallway scanning left/right with random quick turns left/right, with no stopping after turn/gait kept going, x 2 laps. Moderate dizziness reported. - up/down hallway scanning up/down with random quick turns left/right with no stopping after turns, x 2 laps. Minimal dizziness reported.   On red mats - figure 8 around cones x 6 laps - figure 8 with bil toe taps to each cone before completing the turn x 6 laps - figure 8 with inside hand touch to cone during the turn portion x 6 laps. Minimal dizziness reported.         PT Short Term Goals - 07/09/14 9163    PT SHORT TERM GOAL #1   Title The patient will be indep with HEP  for balance, dizziness and general mobility.  Target date 07/12/2014   Baseline Met on 07/09/2014.   Time 4   Period Weeks   Status Achieved   PT SHORT TERM GOAL #2   Title The patient will improve Berg score from 37/56 up to 44/56 to demo decreasing risk for falls. Target date 07/12/2014   Baseline Met on 07/09/2014 scoring 48/56.   Time 4   Period Weeks   Status Achieved   PT SHORT TERM GOAL #3   Title The patient will improve gait speed from 2.57 ft/sec up to 3.0 ft/sec to demo improving functional ambulation. Target date 07/12/2014   Baseline Met on 07/09/2014  with speed 3.54 ft/sec.   Time 4   Period Weeks   Status Achieved   PT SHORT TERM GOAL #4   Title The aptient will negotiate 4 steps without handrail with step to pattern modified indep.  Target date 07/12/2014.   Baseline Goal met on 07/09/2014   Time 4   Period Weeks   Status Achieved   PT SHORT TERM GOAL #5   Title The patient will report baseline dizziness < or equal to 3/10 (currently 5/10). Target date 07/12/2014.   Baseline Improved to 4/10 on 07/09/2014.   Time 4   Period Weeks   Status Partially Met           PT Long Term Goals - 06/12/14 1417    PT LONG TERM GOAL #1   Title The patient will verbalize CVA warning signs/risk factors.  Target date 08/11/2014   Time 8   Period Weeks   PT LONG TERM GOAL #2   Title The patient will improve Berg score from 37/56 up to 48/56 to demo decreased risk for falls.  Target date 08/11/2014.   Time 8   Period Weeks   PT LONG TERM GOAL #3   Title The patient will improve gait speed from 2.57 ft/sec up to > or equal to 3.4 ft/sec to demo improving functional ambulation.  Target date 08/11/2014.   Time 8   Period Weeks   PT LONG TERM GOAL #4   Title The patient will negotiate 4 steps without a handrail with reciprocal pattern independently.  Target date 08/11/2014   Time 8   Period Weeks   PT LONG TERM GOAL #5   Title The patient will negotiate indoor and outdoor surfaces without a device independently to demo return to full community mobility.  Target date 08/11/2014   Time 8   Period Weeks     \      Plan - 07/24/14 1434    Clinical Impression Statement Pt making steady progress toward goals. Challenged most with quick movements/turns at this time.   Pt will benefit from skilled therapeutic intervention in order to improve on the following deficits Decreased activity tolerance;Abnormal gait;Decreased balance;Decreased mobility;Impaired vision/preception;Postural dysfunction;Difficulty walking   Rehab Potential Good   PT Frequency 2x / week    PT Duration 8 weeks   PT Treatment/Interventions Therapeutic activities;Patient/family education;Therapeutic exercise;Balance training;Gait training;Stair training;Neuromuscular re-education;Functional mobility training   PT Next Visit Plan Continue righting reactions on dynamic surfaces with visual challenges; balance challenges with turning   Consulted and Agree with Plan of Care Patient        Problem List Patient Active Problem List   Diagnosis Date Noted  . 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 Essential hypertension   . CVA (cerebral infarction) 05/23/2014  . Basilar artery stenosis   .  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 Vertigo   . HLD (hyperlipidemia)   . 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 Intractable hiccups   . Stroke 05/20/2014  . Insufficiency of posterior brain circulation   . Rash and nonspecific skin eruption 04/10/2014  . Seborrheic keratoses 01/29/2014  . Skin irritation 09/06/2013  . Pain in joint, shoulder region 09/06/2013  . Nevus 08/26/2011  . FH: prostate cancer 08/26/2011  . Routine general medical examination at a health care facility 08/12/2010  . HYPERCHOLESTEROLEMIA 07/11/2007    Willow Ora 07/24/2014, 2:36 PM  Willow Ora, PTA, [facility] [address] [place],  [address] [phone] 07/24/2014, 2:36 PM

## 2014-07-25 ENCOUNTER — Encounter: Payer: Self-pay | Admitting: Family Medicine

## 2014-07-25 MED ORDER — CLOPIDOGREL BISULFATE 75 MG PO TABS: 75.0000 mg | ORAL_TABLET | Freq: Every day | ORAL | Status: DC

## 2014-07-29 ENCOUNTER — Ambulatory Visit: Payer: 59 | Admitting: Rehabilitative and Restorative Service Providers"

## 2014-07-29 DIAGNOSIS — H8149 Vertigo of central origin, unspecified ear: Secondary | ICD-10-CM

## 2014-07-29 DIAGNOSIS — R269 Unspecified abnormalities of gait and mobility: Secondary | ICD-10-CM | POA: Diagnosis not present

## 2014-07-29 NOTE — Therapy (Signed)
[facility] [address] [place], [place], [address] Phone: [phone]   Fax:  [phone]  Physical Therapy Treatment  Patient Details  Name: Alejandro Schneider MRN: [id number] Date of Birth: December 02, 1950 Referring Provider:  Dr. Letta Pate, MD  Encounter Date: 07/29/2014      PT End of Session - 07/29/14 1412    Visit Number 14   Number of Visits 16   Date for PT Re-Evaluation 08/11/14   PT Start Time 0853   PT Stop Time 0935   PT Time Calculation (min) 42 min   Equipment Utilized During Treatment Gait belt   Activity Tolerance Patient tolerated treatment well   Behavior During Therapy [facility] for tasks assessed/performed      Past Medical History  Diagnosis Date  . Hyperlipidemia   . CVA (cerebral infarction)     Past Surgical History  Procedure Laterality Date  . Tonsillectomy  1972  . Inguinal hernia repair  10/16/02    Right  Dr Hassell Done  . Colonoscopy  2006  . Lumbar laminectomy/decompression microdiscectomy Right 05/30/2012    Procedure: LUMBAR LAMINECTOMY/DECOMPRESSION MICRODISCECTOMY 1 LEVEL;  Surgeon: Faythe Ghee, MD;  [facility];  Service: Neurosurgery;  Laterality: Right;  . Radiology with anesthesia N/A 05/21/2014    Procedure: ANGIOPLASTY POSSIBLE STENT;  Surgeon: Rob Hickman, MD;  Location: [place];  Service: Radiology;  Laterality: N/A;    There were no vitals filed for this visit.  Visit Diagnosis:  Abnormality of gait  Central vestibular vertigo, unspecified laterality      Subjective Assessment - 07/29/14 0858    Subjective The patient reports he feels he is improving.  His vision is  less blurry.   Currently in Pain? No/denies            [facility] PT Assessment - 07/29/14 0905    Ambulation/Gait   Gait velocity 4.0 ft/sec   Standardized Balance Assessment   Standardized Balance Assessment Berg Balance Test   Berg Balance Test   Sit to Stand Able to stand without  using hands and stabilize independently   Standing Unsupported Able to stand safely 2 minutes   Sitting with Back Unsupported but Feet Supported on Floor or Stool Able to sit safely and securely 2 minutes   Stand to Sit Sits safely with minimal use of hands   Transfers Able to transfer safely, minor use of hands   Standing Unsupported with Eyes Closed Able to stand 10 seconds safely   Standing Ubsupported with Feet Together Able to place feet together independently and stand 1 minute safely   From Standing, Reach Forward with Outstretched Arm Can reach confidently >25 cm (10")   From Standing Position, Pick up Object from Floor Able to pick up shoe safely and easily   From Standing Position, Turn to Look Behind Over each Shoulder Looks behind from both sides and weight shifts well   Turn 360 Degrees Able to turn 360 degrees safely in 4 seconds or less   Standing Unsupported, Alternately Place Feet on Step/Stool Able to stand independently and safely and complete 8 steps in 20 seconds   Standing Unsupported, One Foot in Front Able to plae foot ahead of the other independently and hold 30 seconds   Standing on One Leg Able to lift leg independently and hold equal to or more than 3 seconds   Total Score 53      NEUROMUSCULAR RE-EDUCATION: Berg=53/56 Standing compliant surface training with eyes open  [facility] [address] [place], [place], [address] Phone: [phone]   Fax:  [phone]  Physical Therapy Treatment  Patient Details  Name: Alejandro Schneider MRN: [id number] Date of Birth: 11-19-1950 Referring Provider:  Dr. Letta Pate, MD  Encounter Date: 07/29/2014      PT End of Session - 07/29/14 1412    Visit Number 14   Number of Visits 16   Date for PT Re-Evaluation 08/11/14   PT Start Time 0853   PT Stop Time 0935   PT Time Calculation (min) 42 min   Equipment Utilized During Treatment Gait belt   Activity Tolerance Patient tolerated treatment well   Behavior During Therapy [facility] for tasks assessed/performed      Past Medical History  Diagnosis Date  . Hyperlipidemia   . CVA (cerebral infarction)     Past Surgical History  Procedure Laterality Date  . Tonsillectomy  1972  . Inguinal hernia repair  10/16/02    Right  Dr Hassell Done  . Colonoscopy  2006  . Lumbar laminectomy/decompression microdiscectomy Right 05/30/2012    Procedure: LUMBAR LAMINECTOMY/DECOMPRESSION MICRODISCECTOMY 1 LEVEL;  Surgeon: Faythe Ghee, MD;  [facility];  Service: Neurosurgery;  Laterality: Right;  . Radiology with anesthesia N/A 05/21/2014    Procedure: ANGIOPLASTY POSSIBLE STENT;  Surgeon: Rob Hickman, MD;  Location: [place];  Service: Radiology;  Laterality: N/A;    There were no vitals filed for this visit.  Visit Diagnosis:  Abnormality of gait  Central vestibular vertigo, unspecified laterality      Subjective Assessment - 07/29/14 0858    Subjective The patient reports he feels he is improving.  His vision is  less blurry.   Currently in Pain? No/denies            [facility] PT Assessment - 07/29/14 0905    Ambulation/Gait   Gait velocity 4.0 ft/sec   Standardized Balance Assessment   Standardized Balance Assessment Berg Balance Test   Berg Balance Test   Sit to Stand Able to stand without  using hands and stabilize independently   Standing Unsupported Able to stand safely 2 minutes   Sitting with Back Unsupported but Feet Supported on Floor or Stool Able to sit safely and securely 2 minutes   Stand to Sit Sits safely with minimal use of hands   Transfers Able to transfer safely, minor use of hands   Standing Unsupported with Eyes Closed Able to stand 10 seconds safely   Standing Ubsupported with Feet Together Able to place feet together independently and stand 1 minute safely   From Standing, Reach Forward with Outstretched Arm Can reach confidently >25 cm (10")   From Standing Position, Pick up Object from Floor Able to pick up shoe safely and easily   From Standing Position, Turn to Look Behind Over each Shoulder Looks behind from both sides and weight shifts well   Turn 360 Degrees Able to turn 360 degrees safely in 4 seconds or less   Standing Unsupported, Alternately Place Feet on Step/Stool Able to stand independently and safely and complete 8 steps in 20 seconds   Standing Unsupported, One Foot in Front Able to plae foot ahead of the other independently and hold 30 seconds   Standing on One Leg Able to lift leg independently and hold equal to or more than 3 seconds   Total Score 53      NEUROMUSCULAR RE-EDUCATION: Berg=53/56 Standing compliant surface training with eyes open  [facility] [address] [place], [place], [address] Phone: [phone]   Fax:  [phone]  Physical Therapy Treatment  Patient Details  Name: Alejandro Schneider MRN: [id number] Date of Birth: 11-19-1950 Referring Provider:  Dr. Letta Pate, MD  Encounter Date: 07/29/2014      PT End of Session - 07/29/14 1412    Visit Number 14   Number of Visits 16   Date for PT Re-Evaluation 08/11/14   PT Start Time 0853   PT Stop Time 0935   PT Time Calculation (min) 42 min   Equipment Utilized During Treatment Gait belt   Activity Tolerance Patient tolerated treatment well   Behavior During Therapy [facility] for tasks assessed/performed      Past Medical History  Diagnosis Date  . Hyperlipidemia   . CVA (cerebral infarction)     Past Surgical History  Procedure Laterality Date  . Tonsillectomy  1972  . Inguinal hernia repair  10/16/02    Right  Dr Hassell Done  . Colonoscopy  2006  . Lumbar laminectomy/decompression microdiscectomy Right 05/30/2012    Procedure: LUMBAR LAMINECTOMY/DECOMPRESSION MICRODISCECTOMY 1 LEVEL;  Surgeon: Faythe Ghee, MD;  [facility];  Service: Neurosurgery;  Laterality: Right;  . Radiology with anesthesia N/A 05/21/2014    Procedure: ANGIOPLASTY POSSIBLE STENT;  Surgeon: Rob Hickman, MD;  Location: [place];  Service: Radiology;  Laterality: N/A;    There were no vitals filed for this visit.  Visit Diagnosis:  Abnormality of gait  Central vestibular vertigo, unspecified laterality      Subjective Assessment - 07/29/14 0858    Subjective The patient reports he feels he is improving.  His vision is  less blurry.   Currently in Pain? No/denies            [facility] PT Assessment - 07/29/14 0905    Ambulation/Gait   Gait velocity 4.0 ft/sec   Standardized Balance Assessment   Standardized Balance Assessment Berg Balance Test   Berg Balance Test   Sit to Stand Able to stand without  using hands and stabilize independently   Standing Unsupported Able to stand safely 2 minutes   Sitting with Back Unsupported but Feet Supported on Floor or Stool Able to sit safely and securely 2 minutes   Stand to Sit Sits safely with minimal use of hands   Transfers Able to transfer safely, minor use of hands   Standing Unsupported with Eyes Closed Able to stand 10 seconds safely   Standing Ubsupported with Feet Together Able to place feet together independently and stand 1 minute safely   From Standing, Reach Forward with Outstretched Arm Can reach confidently >25 cm (10")   From Standing Position, Pick up Object from Floor Able to pick up shoe safely and easily   From Standing Position, Turn to Look Behind Over each Shoulder Looks behind from both sides and weight shifts well   Turn 360 Degrees Able to turn 360 degrees safely in 4 seconds or less   Standing Unsupported, Alternately Place Feet on Step/Stool Able to stand independently and safely and complete 8 steps in 20 seconds   Standing Unsupported, One Foot in Front Able to plae foot ahead of the other independently and hold 30 seconds   Standing on One Leg Able to lift leg independently and hold equal to or more than 3 seconds   Total Score 53      NEUROMUSCULAR RE-EDUCATION: Berg=53/56 Standing compliant surface training with eyes open

## 2014-08-01 ENCOUNTER — Ambulatory Visit: Payer: 59 | Admitting: Rehabilitative and Restorative Service Providers"

## 2014-08-01 DIAGNOSIS — R269 Unspecified abnormalities of gait and mobility: Secondary | ICD-10-CM

## 2014-08-01 DIAGNOSIS — H8149 Vertigo of central origin, unspecified ear: Secondary | ICD-10-CM

## 2014-08-01 NOTE — Therapy (Signed)
[facility] [address] [place], [place], [address] Phone: [phone]   Fax:  [phone]  Physical Therapy Treatment  Patient Details  Name: Alejandro Schneider MRN: [id number] Date of Birth: 08-Jul-1950 Referring Provider:  Joaquim Nam, MD  Encounter Date: 08/01/2014      PT End of Session - 08/01/14 1315    Visit Number 15   Number of Visits 16   Date for PT Re-Evaluation 08/11/14   PT Start Time 0852   PT Stop Time 0930   PT Time Calculation (min) 38 min   Equipment Utilized During Treatment Gait belt   Activity Tolerance Patient tolerated treatment well   Behavior During Therapy [facility] for tasks assessed/performed      Past Medical History  Diagnosis Date  . Hyperlipidemia   . CVA (cerebral infarction)     Past Surgical History  Procedure Laterality Date  . Tonsillectomy  1972  . Inguinal hernia repair  10/16/02    Right  Dr Daphine Deutscher  . Colonoscopy  2006  . Lumbar laminectomy/decompression microdiscectomy Right 05/30/2012    Procedure: LUMBAR LAMINECTOMY/DECOMPRESSION MICRODISCECTOMY 1 LEVEL;  Surgeon: Reinaldo Meeker, MD;  [facility];  Service: Neurosurgery;  Laterality: Right;  . Radiology with anesthesia N/A 05/21/2014    Procedure: ANGIOPLASTY POSSIBLE STENT;  Surgeon: Oneal Grout, MD;  [facility];  Service: Radiology;  Laterality: N/A;    There were no vitals filed for this visit.  Visit Diagnosis:  Central vestibular vertigo, unspecified laterality  Abnormality of gait      Subjective Assessment - 08/01/14 0857    Subjective The patient reports doing well. No changes.      Gait: Dynamic gait activities walking on level surfaces with supervision assistance x 50 ft x 3 performing ball toss over right and left shoulders.  NEUROMUSCULAR RE-EDUCATION: Compliant surface training on rocker board with UE movements, head turns, and eyes closed with CGA for safety.  Stepping  between bosu, foam, and rocker board for dynamic negotiation of compliant surfaces with CGA for safety.  Single leg stance activities between cones and compliant surfaces.  Physioball seated with rhythmic stabilization of UEs Seated on ball with feet on compliant surfaces for core and balance/righting reaction  SELF CARE/HOME MANAGEMENT: Recommended patient continue HEP progressing cardio activities to standing and continue current HEP and home walking program.                           PT Short Term Goals - 07/09/14 6962    PT SHORT TERM GOAL #1   Title The patient will be indep with HEP for balance, dizziness and general mobility.  Target date 07/12/2014   Baseline Met on 07/09/2014.   Time 4   Period Weeks   Status Achieved   PT SHORT TERM GOAL #2   Title The patient will improve Berg score from 37/56 up to 44/56 to demo decreasing risk for falls. Target date 07/12/2014   Baseline Met on 07/09/2014 scoring 48/56.   Time 4   Period Weeks   Status Achieved   PT SHORT TERM GOAL #3   Title The patient will improve gait speed from 2.57 ft/sec up to 3.0 ft/sec to demo improving functional ambulation. Target date 07/12/2014   Baseline Met on 07/09/2014 with speed 3.54 ft/sec.   Time 4   Period Weeks   Status Achieved   PT SHORT TERM GOAL #4  Title The aptient will negotiate 4 steps without handrail with step to pattern modified indep.  Target date 07/12/2014.   Baseline Goal met on 07/09/2014   Time 4   Period Weeks   Status Achieved   PT SHORT TERM GOAL #5   Title The patient will report baseline dizziness < or equal to 3/10 (currently 5/10). Target date 07/12/2014.   Baseline Improved to 4/10 on 07/09/2014.   Time 4   Period Weeks   Status Partially Met           PT Long Term Goals - 08/01/14 1315    PT LONG TERM GOAL #1   Title The patient will verbalize CVA warning signs/risk factors.  Target date 08/11/2014   Baseline Met on 07/29/2014   Time 8   Period Weeks    Status Achieved   PT LONG TERM GOAL #2   Title The patient will improve Berg score from 37/56 up to 48/56 to demo decreased risk for falls.  Target date 08/11/2014.   Baseline met on 07/29/2014 scoring 53/56.   Time 8   Period Weeks   Status Achieved   PT LONG TERM GOAL #3   Title The patient will improve gait speed from 2.57 ft/sec up to > or equal to 3.4 ft/sec to demo improving functional ambulation.  Target date 08/11/2014.   Baseline goal met with patient ambulating 4.0 ft/sec on 07/29/2014   Time 8   Period Weeks   Status Achieved   PT LONG TERM GOAL #4   Title The patient will negotiate 4 steps without a handrail with reciprocal pattern independently.  Target date 08/11/2014   Baseline met on 07/29/2014   Time 8   Period Weeks   Status Achieved   PT LONG TERM GOAL #5   Title The patient will negotiate indoor and outdoor surfaces without a device independently to demo return to full community mobility.  Target date 08/11/2014   Baseline Met on 07/29/2014   Time 8   Period Weeks   Status Achieved   PT LONG TERM GOAL #6   Title The patient will demonstrate climbing a ladder in a dark environment with head lamp for work simulation activities.  Target date 08/11/2014   Time 2   Period Weeks   Status On-going   PT LONG TERM GOAL #7   Title The patient will maintain single leg stance x 5 seconds bilaterally to demo improved midline control for balance.  Target date 08/11/2014   Baseline Met on 08/01/2014   Time 2   Period Weeks   Status Achieved   PT LONG TERM GOAL #8   Title The patient will have exercise program for post d/c progression. Target date 08/11/2014   Time 2   Period Weeks   Status Achieved               Plan - 08/01/14 1316    Clinical Impression Statement The patient is continuing to make significant progress with PT.  Held visits to f/u after rehab MD f/u in mid May.  PT to check progress and complete work simulation tasks.     PT Next Visit Plan discharge next  visit, work simulation   Consulted and Agree with Plan of Care Patient        Problem List Patient Active Problem List   Diagnosis Date Noted  . 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 Essential hypertension   . CVA (cerebral infarction) 05/23/2014  .  Basilar artery stenosis   . 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 Vertigo   . HLD (hyperlipidemia)   . 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 Intractable hiccups   . Stroke 05/20/2014  . Insufficiency of posterior brain circulation   . Rash and nonspecific skin eruption 04/10/2014  . Seborrheic keratoses 01/29/2014  . Skin irritation 09/06/2013  . Pain in joint, shoulder region 09/06/2013  . Nevus 08/26/2011  . FH: prostate cancer 08/26/2011  . Routine general medical examination at a health care facility 08/12/2010  . HYPERCHOLESTEROLEMIA 07/11/2007    Sabrine Patchen, PT 08/01/2014, 1:17 PM  [facility] [address] [place], [place], [address] Phone: [phone]   Fax:  [phone]

## 2014-08-05 ENCOUNTER — Ambulatory Visit: Payer: 59 | Admitting: Physical Therapy

## 2014-08-12 ENCOUNTER — Ambulatory Visit: Payer: 59 | Admitting: Physical Therapy

## 2014-08-13 ENCOUNTER — Encounter: Payer: 59 | Attending: Physical Medicine & Rehabilitation

## 2014-08-13 ENCOUNTER — Encounter: Payer: Self-pay | Admitting: Physical Medicine & Rehabilitation

## 2014-08-13 ENCOUNTER — Ambulatory Visit (HOSPITAL_BASED_OUTPATIENT_CLINIC_OR_DEPARTMENT_OTHER): Payer: 59 | Admitting: Physical Medicine & Rehabilitation

## 2014-08-13 VITALS — BP 148/84 | HR 64 | Resp 14

## 2014-08-13 DIAGNOSIS — H8149 Vertigo of central origin, unspecified ear: Secondary | ICD-10-CM | POA: Diagnosis present

## 2014-08-13 DIAGNOSIS — I69393 Ataxia following cerebral infarction: Secondary | ICD-10-CM | POA: Insufficient documentation

## 2014-08-13 DIAGNOSIS — G463 Brain stem stroke syndrome: Secondary | ICD-10-CM

## 2014-08-13 DIAGNOSIS — G464 Cerebellar stroke syndrome: Secondary | ICD-10-CM

## 2014-08-13 NOTE — Progress Notes (Signed)
Subjective:    Patient ID: Alejandro Schneider, male    DOB: 11-Aug-1950, 64 y.o.   MRN: [id number] 64 year old right-handed male with history of hyperlipidemia, independent prior to admission, living with his wife, working for [employer].  Admitted May 20, 2014, with dizziness, facial weakness, and headache.  MRI of the head showed acute infarct, left posterolateral medulla.  MRA of the head with occluded left vertebral artery as well as free occlusive stenosis of the right vertebrobasilar junction and possibly the proximal basilar artery. Echocardiogram with ejection fraction 65% and grade 1 diastolic dysfunction.  CT angiogram of the head and neck again showed right vertebrobasilar junction and proximal basilar artery stenosis. Attempted stenting by Interventional Radiology was unsuccessful.  There were considerations being made for possible re-attempted stenting in the next few weeks.  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 General activity 0 Relation with others 0 Enjoyment of life 0 What TIME of day is your pain at its worst? VARIES Sleep (in general) Good  Pain is worse with: some activites Pain improves with: rest and medication Relief from Meds: 0  Mobility walk without assistance how many minutes can you walk? 30 ability to climb steps?  yes do you drive?  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  no  Physicians involved in your care Any changes since last visit?  no   Family History  Problem Relation Age of Onset  . Heart disease Father     CABGx4  . Cancer Father     Prostate (Radiation)  . Prostate cancer Father   . Heart disease Paternal Grandfather     MI  . Hypertension Neg Hx   . Drug abuse Neg Hx   . Alcohol abuse Neg Hx   . Stroke Neg Hx   . Colon cancer Neg Hx   . Diabetes Cousin   . Depression Mother    History   Social History  . Marital Status: Married    Spouse Name: N/A  . Number of Children: 1  . Years of Education: N/A   Occupational History  . Lineman [employer]    Social History Main Topics  . Smoking status: Never Smoker   . Smokeless tobacco: Never Used  . Alcohol Use: 0.0 oz/week     Comment: rarely  . Drug Use: No  . Sexual Activity: Yes   Other Topics Concern  . 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   Past Surgical History  Procedure Laterality Date  . Tonsillectomy  1972  . Inguinal hernia repair  10/16/02    Right  Dr Hassell Done  . Colonoscopy  2006  . Lumbar laminectomy/decompression microdiscectomy Right 05/30/2012  Procedure: LUMBAR LAMINECTOMY/DECOMPRESSION MICRODISCECTOMY 1 LEVEL;  Surgeon: Faythe Ghee, MD;  Location: [facility];  Service: Neurosurgery;  Laterality: Right;  . Radiology with anesthesia N/A 05/21/2014    Procedure: ANGIOPLASTY POSSIBLE STENT;  Surgeon: Rob Hickman, MD;  Location: [facility];  Service: Radiology;  Laterality: N/A;   Past Medical History  Diagnosis Date  . Hyperlipidemia   . CVA (cerebral infarction)    BP 148/84 mmHg  Pulse 64  Resp 14  SpO2 99%  Opioid Risk Score:   Fall Risk Score: Low Fall Risk (0-5 points)`1  Depression screen PHQ 2/9  Depression screen PHQ 2/9 07/12/2014   Decreased Interest 0  Down, Depressed, Hopeless 0  PHQ - 2 Score 0  Altered sleeping 0  Tired, decreased energy 1  Change in appetite 0  Feeling bad or failure about yourself  0  Trouble concentrating 0  Moving slowly or fidgety/restless 0  Suicidal thoughts 0  PHQ-9 Score 1     Review of Systems  HENT: Negative.   Eyes: Negative.   Respiratory: Negative.   Cardiovascular: Negative.   Gastrointestinal: Negative.   Endocrine: Negative.   Genitourinary: Negative.   Musculoskeletal:       Right fore arm and leg pain  Skin: Negative.   Allergic/Immunologic: Negative.   Neurological: Positive for weakness.       Tingling  Hematological: Negative.   Psychiatric/Behavioral: Negative.        Objective:   Physical Exam  Constitutional: He is oriented to person, place, and time. He appears well-developed and well-nourished.  HENT:  Head: Normocephalic and atraumatic.  Neurological: He is alert and oriented to person, place, and time. He has normal strength. No sensory deficit. He displays a negative Romberg sign. Gait abnormal. Coordination normal.  Reduced tandem gait , reduced heel walking  FNF intact  Heel shin intact  Moderate pinprick decreased sensation on the right upper Mild pinprick decreased sensation right lateral leg       Psychiatric: He has a normal mood and affect.  Nursing note and vitals reviewed.  Sedation equal to pinprick bilaterally       Assessment & Plan:  1. 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 It is recommended that the patient first drives with another licensed driver in an empty parking lot. If the patient does well with this, and they can drive on a quiet street with the licensed driver. If the patient does well with this they can drive on a busy  street with a licensed driver. If the patient does well with this, the next time out they can go by himself. For the first month after resuming driving, I recommend no nighttime or Interstate driving.   Do not think patient is able to go back to work at the current time. We'll reevaluate in 6 weeks. We should know long-term disability at the 6 month mark.

## 2014-08-13 NOTE — Patient Instructions (Signed)
Graduated return to driving instructions were provided. It is recommended that the patient first drives with another licensed driver in an empty parking lot. If the patient does well with this, and they can drive on a quiet street with the licensed driver. If the patient does well with this they can drive on a busy street with a licensed driver. If the patient does well with this, the next time out they can go by himself. For the first month after resuming driving, I recommend no nighttime or Interstate driving.   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

## 2014-08-16 ENCOUNTER — Ambulatory Visit: Payer: 59 | Attending: Physical Medicine & Rehabilitation | Admitting: Rehabilitative and Restorative Service Providers"

## 2014-08-16 ENCOUNTER — Encounter: Payer: Self-pay | Admitting: Rehabilitative and Restorative Service Providers"

## 2014-08-16 DIAGNOSIS — R269 Unspecified abnormalities of gait and mobility: Secondary | ICD-10-CM | POA: Diagnosis not present

## 2014-08-16 DIAGNOSIS — R2689 Other abnormalities of gait and mobility: Secondary | ICD-10-CM | POA: Diagnosis not present

## 2014-08-16 DIAGNOSIS — H8149 Vertigo of central origin, unspecified ear: Secondary | ICD-10-CM | POA: Diagnosis not present

## 2014-08-16 NOTE — Patient Instructions (Signed)
1)Continue with 360 degree turns provoking 3-5/10 dizziness.   Begin with 5 times to each side with pause in between.  Can increase # of times or speed, or decrease the pause in between repetitions.  2) Walk around your house with your helmet on for practice.  Then perform that in the dark with the head light on for balance training. To progress, you could walk outdoors on unlevel surfaces with helmet and head light in the dark.  Continue other exercises that feel challenging.  When they get easy, they are no longer beneficial.

## 2014-08-16 NOTE — Therapy (Signed)
[facility] [address] [place], [place], [address] Phone: [phone]   Fax:  [phone]  Physical Therapy Treatment  Patient Details  Name: Alejandro Schneider MRN: [id number] Date of Birth: [birth year] Referring Provider:  Joaquim Nam, MD  Encounter Date: 08/16/2014      PT End of Session - 08/16/14 1105    Visit Number 16   Number of Visits 16   Date for PT Re-Evaluation 08/11/14   PT Start Time 1020   PT Stop Time 1100   PT Time Calculation (min) 40 min   Activity Tolerance Patient tolerated treatment well   Behavior During Therapy [facility] for tasks assessed/performed      Past Medical History  Diagnosis Date  . Hyperlipidemia   . CVA (cerebral infarction)     Past Surgical History  Procedure Laterality Date  . Tonsillectomy  1972  . Inguinal hernia repair  10/16/02    Right  Dr Daphine Deutscher  . Colonoscopy  2006  . Lumbar laminectomy/decompression microdiscectomy Right 05/30/2012    Procedure: LUMBAR LAMINECTOMY/DECOMPRESSION MICRODISCECTOMY 1 LEVEL;  Surgeon: Reinaldo Meeker, MD;  Location: [facility];  Service: Neurosurgery;  Laterality: Right;  . Radiology with anesthesia N/A 05/21/2014    Procedure: ANGIOPLASTY POSSIBLE STENT;  Surgeon: Oneal Grout, MD;  Location: [facility];  Service: Radiology;  Laterality: N/A;    There were no vitals filed for this visit.  Visit Diagnosis:  Central vestibular vertigo, unspecified laterality  Abnormality of gait      Subjective Assessment - 08/16/14 1026    Subjective The patient reports he has started driving with a liscensed driver and is doing a graduated driving program.  He is returning to all activities in the home and yard work with occasional left pulling reporting "my head just feels different".  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   He had eye doctor appt and his physician felt like symptoms would improve over time.   Currently in Pain? No/denies      NEUROMUSCULAR RE-EDUCATION: 360 degree turns x 5 reps with pause after each rep to both R and L sides Discussed home progression of activity  180 degree turns Single limb stance activities with head turns with CGA for safety Standing quick side to side head turns x 10 repetitions with narrowing base of support  THERAPEUTIC ACTIVITIES: Had patient demonstrate carrying/climbing ladder in low light environment with dark room and one flashlight for visual input to perform work simulation tasks. Discussed progressing activities at home (wearing work helmet in house and then walking in dark with work helmet and head lamp on)                             PT Education - 08/16/14 1107    Education provided Yes   Education Details HEP: turns progression, home walking with work gear on for practice in dark situations   Person(s) Educated Patient   Methods Explanation;Demonstration;Handout   Comprehension Returned demonstration;Verbalized understanding          PT Short Term Goals - 07/09/14 0814    PT SHORT TERM GOAL #1   Title The patient will be indep with HEP for balance, dizziness and general mobility.  Target date  07/12/2014   Baseline Met on 07/09/2014.   Time 4   Period Weeks   Status Achieved   PT SHORT TERM GOAL #2   Title The patient will improve Berg score from 37/56 up to 44/56 to demo decreasing risk for falls. Target date 07/12/2014   Baseline Met on 07/09/2014 scoring 48/56.   Time 4   Period Weeks   Status Achieved   PT SHORT TERM GOAL #3   Title The patient will improve gait speed from 2.57 ft/sec up to 3.0 ft/sec to demo improving functional ambulation. Target date 07/12/2014   Baseline Met on 07/09/2014 with speed 3.54 ft/sec.   Time 4   Period Weeks   Status Achieved   PT SHORT TERM GOAL #4    Title The aptient will negotiate 4 steps without handrail with step to pattern modified indep.  Target date 07/12/2014.   Baseline Goal met on 07/09/2014   Time 4   Period Weeks   Status Achieved   PT SHORT TERM GOAL #5   Title The patient will report baseline dizziness < or equal to 3/10 (currently 5/10). Target date 07/12/2014.   Baseline Improved to 4/10 on 07/09/2014.   Time 4   Period Weeks   Status Partially Met           PT Long Term Goals - 08/16/14 1031    PT LONG TERM GOAL #1   Title The patient will verbalize CVA warning signs/risk factors.  Target date 08/11/2014   Baseline Met on 07/29/2014   Time 8   Period Weeks   Status Achieved   PT LONG TERM GOAL #2   Title The patient will improve Berg score from 37/56 up to 48/56 to demo decreased risk for falls.  Target date 08/11/2014.   Baseline met on 07/29/2014 scoring 53/56.   Time 8   Period Weeks   Status Achieved   PT LONG TERM GOAL #3   Title The patient will improve gait speed from 2.57 ft/sec up to > or equal to 3.4 ft/sec to demo improving functional ambulation.  Target date 08/11/2014.   Baseline goal met with patient ambulating 4.0 ft/sec on 07/29/2014   Time 8   Period Weeks   Status Achieved   PT LONG TERM GOAL #4   Title The patient will negotiate 4 steps without a handrail with reciprocal pattern independently.  Target date 08/11/2014   Baseline met on 07/29/2014   Time 8   Period Weeks   Status Achieved   PT LONG TERM GOAL #5   Title The patient will negotiate indoor and outdoor surfaces without a device independently to demo return to full community mobility.  Target date 08/11/2014   Baseline Met on 07/29/2014   Time 8   Period Weeks   Status Achieved   PT LONG TERM GOAL #6   Title The patient will demonstrate climbing a ladder in a dark environment with head lamp for work simulation activities.  Target date 08/11/2014   Baseline *dark room with flashlight light provided only- pt able to climb safely--PT cannot  fully recreate work demands.   Time 2   Period Weeks   Status Achieved   PT LONG TERM GOAL #7   Title The patient will maintain single leg stance x 5 seconds bilaterally to demo improved midline control for balance.  Target date 08/11/2014   Baseline Met on 08/01/2014   Time 2   Period Weeks   Status Achieved   PT  LONG TERM GOAL #8   Title The patient will have exercise program for post d/c progression. Target date 08/11/2014   Time 2   Period Weeks   Status Achieved               Plan - 08/16/14 1107    Clinical Impression Statement The patient met all LTGs.  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  He can perform ladder activities in the clinic, but we cannot recreate full work environment and height of activities performed.  PT recommend patient continue to work on challenging home activities (safely).   PT Next Visit Plan Discharge today   Consulted and Agree with Plan of Care Patient        Problem List Patient Active Problem List   Diagnosis Date Noted  . 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 Essential hypertension   . CVA (cerebral infarction) 05/23/2014  . Basilar artery stenosis   . 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 Vertigo   . HLD (hyperlipidemia)   . 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 Intractable hiccups   . Stroke 05/20/2014  . Insufficiency of posterior brain circulation   . Rash and nonspecific skin eruption 04/10/2014  . Seborrheic keratoses 01/29/2014  . Skin irritation 09/06/2013  . Pain in joint, shoulder region 09/06/2013  . Nevus 08/26/2011  . FH: prostate cancer 08/26/2011  . Routine general medical examination at a health care facility 08/12/2010  . HYPERCHOLESTEROLEMIA 07/11/2007    Zabdi Mis, PT 08/16/2014, 4:15 PM  [facility] [address] [place], [place], [address] Phone: [phone]   Fax:  [phone]

## 2014-08-16 NOTE — Therapy (Signed)
[facility] [address] [place], [place], [address] Phone: [phone]   Fax:  [phone]  Patient Details  Name: Alejandro Schneider MRN: [id number] Date of Birth: [birth year] Referring Provider:  No ref. provider found  Encounter Date: 08/16/2014  PHYSICAL THERAPY DISCHARGE SUMMARY  Visits from Start of Care: 16  Current functional level related to goals / functional outcomes:     PT Long Term Goals - 08/16/14 1031    PT LONG TERM GOAL #1   Title The patient will verbalize CVA warning signs/risk factors.  Target date 08/11/2014   Baseline Met on 07/29/2014   Time 8   Period Weeks   Status Achieved   PT LONG TERM GOAL #2   Title The patient will improve Berg score from 37/56 up to 48/56 to demo decreased risk for falls.  Target date 08/11/2014.   Baseline met on 07/29/2014 scoring 53/56.   Time 8   Period Weeks   Status Achieved   PT LONG TERM GOAL #3   Title The patient will improve gait speed from 2.57 ft/sec up to > or equal to 3.4 ft/sec to demo improving functional ambulation.  Target date 08/11/2014.   Baseline goal met with patient ambulating 4.0 ft/sec on 07/29/2014   Time 8   Period Weeks   Status Achieved   PT LONG TERM GOAL #4   Title The patient will negotiate 4 steps without a handrail with reciprocal pattern independently.  Target date 08/11/2014   Baseline met on 07/29/2014   Time 8   Period Weeks   Status Achieved   PT LONG TERM GOAL #5   Title The patient will negotiate indoor and outdoor surfaces without a device independently to demo return to full community mobility.  Target date 08/11/2014   Baseline Met on 07/29/2014   Time 8   Period Weeks   Status Achieved   PT LONG TERM GOAL #6   Title The patient will demonstrate climbing a ladder in a dark environment with head lamp for work simulation activities.  Target date 08/11/2014   Baseline *dark room with flashlight light provided only- pt able to climb  safely--PT cannot fully recreate work demands.   Time 2   Period Weeks   Status Achieved   PT LONG TERM GOAL #7   Title The patient will maintain single leg stance x 5 seconds bilaterally to demo improved midline control for balance.  Target date 08/11/2014   Baseline Met on 08/01/2014   Time 2   Period Weeks   Status Achieved   PT LONG TERM GOAL #8   Title The patient will have exercise program for post d/c progression. Target date 08/11/2014   Time 2   Period Weeks   Status Achieved        Remaining deficits: Pulling sensation to L side at times, worse in multi-sensory environments   Education / Equipment: HEP, home work simulation activities in preparation for return to work when medically cleared.  Plan: Patient agrees to discharge.  Patient goals were met. Patient is being discharged due to meeting the stated rehab goals.  ?????        Thank you for the referral of this patient.     Corinne, [place] 08/16/2014, 4:20 PM  [facility] [address] [place], [place], [address] Phone: [phone]   Fax:  [phone]

## 2014-09-09 ENCOUNTER — Other Ambulatory Visit: Payer: Self-pay | Admitting: Family Medicine

## 2014-09-09 DIAGNOSIS — I63212 Cerebral infarction due to unspecified occlusion or stenosis of left vertebral arteries: Secondary | ICD-10-CM

## 2014-09-09 DIAGNOSIS — Z125 Encounter for screening for malignant neoplasm of prostate: Secondary | ICD-10-CM

## 2014-09-19 ENCOUNTER — Other Ambulatory Visit (INDEPENDENT_AMBULATORY_CARE_PROVIDER_SITE_OTHER): Payer: 59

## 2014-09-19 DIAGNOSIS — Z125 Encounter for screening for malignant neoplasm of prostate: Secondary | ICD-10-CM | POA: Diagnosis not present

## 2014-09-19 DIAGNOSIS — I63212 Cerebral infarction due to unspecified occlusion or stenosis of left vertebral arteries: Secondary | ICD-10-CM | POA: Diagnosis not present

## 2014-09-19 LAB — COMPREHENSIVE METABOLIC PANEL
ALK PHOS: 73 U/L (ref 39–117)
ALT: 18 U/L (ref 0–53)
AST: 18 U/L (ref 0–37)
Albumin: 4.2 g/dL (ref 3.5–5.2)
BUN: 13 mg/dL (ref 6–23)
CO2: 26 mEq/L (ref 19–32)
Calcium: 9 mg/dL (ref 8.4–10.5)
Chloride: 106 mEq/L (ref 96–112)
Creatinine, Ser: 0.79 mg/dL (ref 0.40–1.50)
GFR: 105.05 mL/min (ref >60.00)
GLUCOSE: 99 mg/dL (ref 70–99)
Potassium: 4 mEq/L (ref 3.5–5.1)
SODIUM: 138 meq/L (ref 135–145)
TOTAL PROTEIN: 6.3 g/dL (ref 6.0–8.3)
Total Bilirubin: 0.8 mg/dL (ref 0.2–1.2)

## 2014-09-19 LAB — PSA: PSA: 2.25 ng/mL (ref 0.10–4.00)

## 2014-09-19 LAB — LIPID PANEL
CHOL/HDL RATIO: 4
Cholesterol: 160 mg/dL (ref 0–200)
HDL: 37.3 mg/dL — ABNORMAL LOW (ref >39.00)
LDL CALC: 102 mg/dL — AB (ref 0–99)
NONHDL: 122.7
Triglycerides: 102 mg/dL (ref 0.0–149.0)
VLDL: 20.4 mg/dL (ref 0.0–40.0)

## 2014-09-23 ENCOUNTER — Encounter: Payer: 59 | Attending: Physical Medicine & Rehabilitation

## 2014-09-23 ENCOUNTER — Encounter: Payer: Self-pay | Admitting: Physical Medicine & Rehabilitation

## 2014-09-23 ENCOUNTER — Ambulatory Visit (HOSPITAL_BASED_OUTPATIENT_CLINIC_OR_DEPARTMENT_OTHER): Payer: 59 | Admitting: Physical Medicine & Rehabilitation

## 2014-09-23 VITALS — BP 158/94 | HR 60 | Resp 14

## 2014-09-23 DIAGNOSIS — G463 Brain stem stroke syndrome: Principal | ICD-10-CM

## 2014-09-23 DIAGNOSIS — H8149 Vertigo of central origin, unspecified ear: Secondary | ICD-10-CM | POA: Diagnosis not present

## 2014-09-23 DIAGNOSIS — G464 Cerebellar stroke syndrome: Secondary | ICD-10-CM

## 2014-09-23 DIAGNOSIS — I6359 Cerebral infarction due to unspecified occlusion or stenosis of other cerebral artery: Secondary | ICD-10-CM | POA: Diagnosis not present

## 2014-09-23 DIAGNOSIS — I69393 Ataxia following cerebral infarction: Secondary | ICD-10-CM | POA: Diagnosis not present

## 2014-09-23 NOTE — Progress Notes (Signed)
Subjective:    Patient ID: Alejandro Schneider, male    DOB: 15-Aug-1950, 64 y.o.   MRN: [id number]  HPI Dizziness comes and goes, no staggering, never while in bed, only when, no falls Has returned to driving, does yardwork, mowing and weed eating Wife notes wider based gait as pt fatigues during the day Pain Inventory Average Pain 3 Pain Right Now 2 My pain is intermittent, burning, tingling and aching  In the last 24 hours, has pain interfered with the following? General activity 0 Relation with others 0 Enjoyment of life 0 What TIME of day is your pain at its worst? evening Sleep (in general) Good  Pain is worse with: some activites Pain improves with: rest Relief from Meds: 0  Mobility walk without assistance how many minutes can you walk? 20 ability to climb steps?  yes do you drive?  yes  Function disabled: date disabled short term disability  Neuro/Psych tingling dizziness  Prior Studies Any changes since last visit?  no  Physicians involved in your care Any changes since last visit?  no   Family History  Problem Relation Age of Onset  . Heart disease Father     CABGx4  . Cancer Father     Prostate (Radiation)  . Prostate cancer Father   . Heart disease Paternal Grandfather     MI  . Hypertension Neg Hx   . Drug abuse Neg Hx   . Alcohol abuse Neg Hx   . Stroke Neg Hx   . Colon cancer Neg Hx   . Diabetes Cousin   . Depression Mother    History   Social History  . Marital Status: Married    Spouse Name: N/A  . Number of Children: 1  . Years of Education: N/A   Occupational History  . Lineman [employer]    Social History Main Topics  . Smoking status: Never Smoker   . Smokeless tobacco: Never Used  . Alcohol Use: 0.0 oz/week     Comment: rarely  . Drug Use: No  . Sexual Activity: Yes   Other Topics Concern  . 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   Past Surgical History  Procedure Laterality Date  . Tonsillectomy  1972  . Inguinal hernia repair  10/16/02    Right  Dr Hassell Done  . Colonoscopy  2006  . Lumbar laminectomy/decompression microdiscectomy Right 05/30/2012    Procedure: LUMBAR LAMINECTOMY/DECOMPRESSION MICRODISCECTOMY 1 LEVEL;  Surgeon: Faythe Ghee, MD;  Location: [facility];  Service: Neurosurgery;  Laterality: Right;  . Radiology with anesthesia N/A 05/21/2014    Procedure: ANGIOPLASTY POSSIBLE STENT;  Surgeon: Rob Hickman, MD;  Location: [facility];  Service: Radiology;  Laterality: N/A;   Past Medical History  Diagnosis Date  . Hyperlipidemia   . CVA (cerebral infarction)    BP 158/94 mmHg  Pulse 60  Resp 14  SpO2 99%  Opioid Risk Score:   Fall Risk Score: Low Fall Risk (0-5 points)`1  Depression screen PHQ 2/9  Depression screen PHQ 2/9 07/12/2014  Decreased Interest 0  Down, Depressed, Hopeless 0  PHQ - 2 Score 0  Altered sleeping 0  Tired, decreased energy 1  Change in appetite 0  Feeling bad or failure about yourself  0  Trouble concentrating 0  Moving slowly or fidgety/restless  0  Suicidal thoughts 0  PHQ-9 Score 1     Review of Systems  Constitutional: Negative.   HENT: Negative.   Eyes: Negative.   Respiratory: Negative.   Cardiovascular: Negative.   Gastrointestinal: Negative.   Endocrine: Negative.   Genitourinary: Negative.   Musculoskeletal: Negative.   Skin: Negative.   Allergic/Immunologic: Negative.   Neurological: Positive for dizziness.       Tingling - right hand and arm  Hematological: Negative.   Psychiatric/Behavioral: The patient is nervous/anxious.       Objective:   Physical Exam  Neuro:  Eyes without evidence of nystagmus  Tone is normal without evidence of spasticity Cerebellar exam shows no evidence of ataxia on finger nose finger or heel to shin testing No evidence of trunkal ataxia  Motor strength is 5/5 in bilateral  deltoid, biceps, triceps, finger flexors and extensors, wrist flexors and extensors, hip flexors, knee flexors and extensors, ankle dorsiflexors, plantar flexors, invertors and evertors, toe flexors and extensors  Sensory exam is normal to  light touch in the upper and lower limbs   Cranial nerves II- Visual fields are intact to confrontation testing, no blurring of vision III- no evidence of ptosis, upward, downward and medial gaze intact IV- no vertical diplopia or head tilt V- no facial numbness or masseter weakness VI- no pupil abduction weakness VII- no facial droop, good lid closure VII- normal auditory acuity IX- no pharygeal weakness, gag nl X- no pharyngeal weakness, no hoarseness XI- no trap or SCM weakness XII- no glossal weakness  Mild fine motor deficits on finger to thumb opposition on the left side Gait has evidence of minimal ataxia with tandem gait. He is able to walk and heel walk     Assessment & Plan:  1. 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 We discussed his return to work. 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 At this point he is unable to perform this work. Will reevaluate in 6-8 weeks to see if he's made any additional improvements. He still has some dizziness when looking upward. He would like to go back to work a possible He is able to drive without restrictions and is independent with all his self-care and mobility

## 2014-09-23 NOTE — Patient Instructions (Signed)
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

## 2014-09-24 ENCOUNTER — Ambulatory Visit (INDEPENDENT_AMBULATORY_CARE_PROVIDER_SITE_OTHER): Payer: 59 | Admitting: Family Medicine

## 2014-09-24 ENCOUNTER — Encounter: Payer: Self-pay | Admitting: Family Medicine

## 2014-09-24 VITALS — BP 120/80 | HR 71 | Temp 98.0°F | Ht 70.0 in | Wt 195.5 lb

## 2014-09-24 DIAGNOSIS — E785 Hyperlipidemia, unspecified: Secondary | ICD-10-CM

## 2014-09-24 DIAGNOSIS — I639 Cerebral infarction, unspecified: Secondary | ICD-10-CM

## 2014-09-24 DIAGNOSIS — Z7189 Other specified counseling: Secondary | ICD-10-CM

## 2014-09-24 DIAGNOSIS — Z0000 Encounter for general adult medical examination without abnormal findings: Secondary | ICD-10-CM

## 2014-09-24 MED ORDER — SIMVASTATIN 40 MG PO TABS: 40.0000 mg | ORAL_TABLET | Freq: Every day | ORAL | Status: DC

## 2014-09-24 NOTE — Patient Instructions (Signed)
Change to 40mg  simvastatin.  If you have aches, then let me know.  Recheck lipids in about 2 months at fasting labs visit.  Take care.  Glad to see you.

## 2014-09-24 NOTE — Progress Notes (Signed)
Pre visit review using our clinic review tool, if applicable. No additional management support is needed unless otherwise documented below in the visit note.  CPE- See plan.  Routine anticipatory guidance given to patient.  See health maintenance. Tetanus 2014 Shingles 2015 Flu 2015 PNA due at 77 Colon 2006, defer exam for now given CVA this year.   psa wnl.  D/w pt.  Living will d/w pt- wife designated if patient were incapacitated.  Diet and exercise d/w pt.  Exercise as tolerated, diet is good.   CVA.  LDL up.  D/w pt.  Tolerating simvastatin.  He saw rehab clinic yesterday, still not cleared to go back to work.  He is making some progress w/o second event.  He'll have f/u imaging later this year.  Now off ASA.  Complaint with meds.  Still with change in sensation on R arm and leg.   PMH and SH reviewed  Meds, vitals, and allergies reviewed.   ROS: See HPI.  Otherwise negative.    GEN: nad, alert and oriented HEENT: mucous membranes moist NECK: supple w/o LA CV: rrr. PULM: ctab, no inc wob ABD: soft, +bs EXT: no edema SKIN: no acute rash CN 2-12 wnl B, S/S/DTR wnl x4 except for dec cold sensation on the R arm and leg. SK noted on back, 9x39mm.

## 2014-09-25 DIAGNOSIS — Z7189 Other specified counseling: Secondary | ICD-10-CM | POA: Insufficient documentation

## 2014-09-25 NOTE — Assessment & Plan Note (Signed)
Routine anticipatory guidance given to patient.  See health maintenance. Tetanus 2014 Shingles 2015 Flu 2015 PNA due at 47 Colon 2006, defer exam for now given CVA this year.   psa wnl.  D/w pt.  Living will d/w pt- wife designated if patient were incapacitated.  Diet and exercise d/w pt.  Exercise as tolerated, diet is good.

## 2014-09-25 NOTE — Assessment & Plan Note (Signed)
LDL up. D/w pt. Tolerating simvastatin. Inc to 40mg  simva, recheck lipids in about 2 months.   He saw rehab clinic yesterday, still not cleared to go back to work. He is making some progress w/o second event. He'll have f/u imaging later this year. Now off ASA. Complaint with meds. Still with change in sensation on R arm and leg.  Will await f/u neuro/imaging reports.

## 2014-09-25 NOTE — Assessment & Plan Note (Signed)
See CVA discussion.

## 2014-10-21 ENCOUNTER — Ambulatory Visit: Payer: 59 | Admitting: Neurology

## 2014-10-28 ENCOUNTER — Encounter: Payer: Self-pay | Admitting: Physical Medicine & Rehabilitation

## 2014-10-28 ENCOUNTER — Ambulatory Visit (HOSPITAL_BASED_OUTPATIENT_CLINIC_OR_DEPARTMENT_OTHER): Payer: 59 | Admitting: Physical Medicine & Rehabilitation

## 2014-10-28 ENCOUNTER — Encounter: Payer: 59 | Attending: Physical Medicine & Rehabilitation

## 2014-10-28 VITALS — BP 141/88 | HR 59 | Resp 14

## 2014-10-28 DIAGNOSIS — I69393 Ataxia following cerebral infarction: Secondary | ICD-10-CM | POA: Insufficient documentation

## 2014-10-28 DIAGNOSIS — Z8673 Personal history of transient ischemic attack (TIA), and cerebral infarction without residual deficits: Secondary | ICD-10-CM | POA: Diagnosis not present

## 2014-10-28 DIAGNOSIS — G464 Cerebellar stroke syndrome: Secondary | ICD-10-CM | POA: Diagnosis not present

## 2014-10-28 DIAGNOSIS — H8149 Vertigo of central origin, unspecified ear: Secondary | ICD-10-CM | POA: Insufficient documentation

## 2014-10-28 NOTE — Patient Instructions (Signed)
Please call if you have any difficulties when you return to work

## 2014-10-28 NOTE — Progress Notes (Signed)
Subjective:    Patient ID: Alejandro Schneider, male    DOB: [birth year], 64 y.o.   MRN: [id number]  Admitted May 20, 2014, with dizziness, facial weakness, and headache.  MRI of the head showed acute infarct, left posterolateral medulla.  HPI Patient returns today feels like he is doing overall better. He's been climbing a ladder at home. He sometimes feels funny looking up but does not lose his balance does not really feel dizzy. No falls at home.  Patient is using a chainsaw and other power tools. He also has written a farm tractor to cut hay at his mother's property  Has been doing electrical work at home required his camper from 30 a.mp. To 50 amp  Pain Inventory Average Pain 0 Pain Right Now 0 My pain is intermittent and tingling  In the last 24 hours, has pain interfered with the following? General activity 0 Relation with others 0 Enjoyment of life 0 What TIME of day is your pain at its worst? daytime Sleep (in general) Good  Pain is worse with: no pain Pain improves with: rest Relief from Meds: no pain  Mobility walk without assistance ability to climb steps?  yes do you drive?  yes  Function employed # of hrs/week . 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  no  Physicians involved in your care Any changes since last visit?  no   Family History  Problem Relation Age of Onset  . Heart disease Father     CABGx4  . Cancer Father     Prostate (Radiation)  . Prostate cancer Father   . Heart disease Paternal Grandfather     MI  . Hypertension Neg Hx   . Drug abuse Neg Hx   . Alcohol abuse Neg Hx   . Stroke Neg Hx   . Colon cancer Neg Hx   . Diabetes Cousin   . Depression Mother    History   Social History  . Marital Status: Married    Spouse Name: N/A  . Number of Children: 1  . Years of Education: N/A   Occupational History  . Lineman [employer]    Social History Main Topics  .  Smoking status: Never Smoker   . Smokeless tobacco: Never Used  . Alcohol Use: 0.0 oz/week     Comment: rarely  . Drug Use: No  . Sexual Activity: Yes   Other Topics Concern  . 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 Tonsillectomy  1972  . Inguinal hernia repair  10/16/02    Right  Dr Hassell Done  . Colonoscopy  2006  . Lumbar laminectomy/decompression microdiscectomy Right 05/30/2012    Procedure: LUMBAR LAMINECTOMY/DECOMPRESSION MICRODISCECTOMY 1 LEVEL;  Surgeon: Faythe Ghee, MD;  Location: [facility];  Service: Neurosurgery;  Laterality: Right;  . Radiology with anesthesia N/A 05/21/2014    Procedure: ANGIOPLASTY POSSIBLE STENT;  Surgeon: Rob Hickman, MD;  Location: [facility];  Service: Radiology;  Laterality: N/A;   Past Medical History  Diagnosis Date  . Hyperlipidemia   . CVA (cerebral infarction)    BP 141/88 mmHg  Pulse 59  Resp 14  SpO2 98%  Opioid Risk Score:   Fall Risk Score:  `1  Depression  screen PHQ 2/9  Depression screen [facility] 2/9 10/28/2014 07/12/2014  Decreased Interest 0 0  Down, Depressed, Hopeless 0 0  PHQ - 2 Score 0 0  Altered sleeping - 0  Tired, decreased energy - 1  Change in appetite - 0  Feeling bad or failure about yourself  - 0  Trouble concentrating - 0  Moving slowly or fidgety/restless - 0  Suicidal thoughts - 0  PHQ-9 Score - 1     Review of Systems  Neurological:       Tingling  All other systems reviewed and are negative.      Objective:   Physical Exam  Neuro:  Eyes without evidence of nystagmus  Tone is normal without evidence of spasticity Cerebellar exam shows no evidence of ataxia on finger nose finger or heel to shin testing No evidence of trunkal ataxia  Motor strength is 5/5 in bilateral deltoid, biceps, triceps, finger flexors and extensors, wrist flexors and  extensors, hip flexors, knee flexors and extensors, ankle dorsiflexors, plantar flexors, invertors and evertors, toe flexors and extensors  Sensory exam is normal to  light touch in the upper and lower limbs   Cranial nerves II- Visual fields are intact to confrontation testing, no blurring of vision III- no evidence of ptosis, upward, downward and medial gaze intact IV- no vertical diplopia or head tilt V- no facial numbness or masseter weakness VI- no pupil abduction weakness VII- no facial droop, good lid closure VII- normal auditory acuity IX- no pharygeal weakness, gag nl X- no pharyngeal weakness, no hoarseness XI- no trap or SCM weakness XII- no glossal weakness  Mild fine motor deficits on finger to thumb opposition on the left side Gait has evidence of minimal ataxia with tandem gait. He is able to walk and heel walk With eyes open and eyes closed  Patient is able to look up and walk on his tiptoes maintaining balance and did not complain of dizziness  Romberg is negative      Assessment & Plan:  1. Left medullary infarct approximately 6 months ago.Patient has made excellent recovery. He should be able to go back to work on a full-time basis without restrictions. Over the next 2 weeks he'll need to build up his endurance for a full 8 hour day outside in the heat. THus far has been out working in yard for up to 4 hours. Return to clinic in 4-6 weeks to monitor return to work

## 2014-11-12 ENCOUNTER — Telehealth (HOSPITAL_COMMUNITY): Payer: Self-pay

## 2014-11-12 NOTE — Telephone Encounter (Signed)
[name] called on 11/12/14 to see about scheduling MRI for Alejandro Schneider. Told pt's wife that insurance approval was not back yet and as soon as we get approval then we would call and get MRI scheduled. AW

## 2014-11-14 ENCOUNTER — Encounter: Payer: Self-pay | Admitting: Neurology

## 2014-11-14 ENCOUNTER — Ambulatory Visit (INDEPENDENT_AMBULATORY_CARE_PROVIDER_SITE_OTHER): Payer: 59 | Admitting: Neurology

## 2014-11-14 VITALS — BP 142/86 | HR 56 | Ht 70.0 in | Wt 201.2 lb

## 2014-11-14 DIAGNOSIS — E785 Hyperlipidemia, unspecified: Secondary | ICD-10-CM | POA: Diagnosis not present

## 2014-11-14 DIAGNOSIS — I10 Essential (primary) hypertension: Secondary | ICD-10-CM | POA: Diagnosis not present

## 2014-11-14 DIAGNOSIS — I63011 Cerebral infarction due to thrombosis of right vertebral artery: Secondary | ICD-10-CM

## 2014-11-14 DIAGNOSIS — I651 Occlusion and stenosis of basilar artery: Secondary | ICD-10-CM

## 2014-11-14 NOTE — Progress Notes (Signed)
STROKE NEUROLOGY FOLLOW UP NOTE  NAME: Alejandro Schneider DOB: March 22, 1951  REASON FOR VISIT: stroke follow up HISTORY FROM: pt and chart  Today we had the pleasure of seeing Alejandro Schneider in follow-up at our Neurology Clinic. Pt was accompanied by wife.   History Summary IZAIH Schneider is a 64 y.o. male with hx of HLD admitted for acute onset dizziness, off balance, left face numbness, and vomiting. Symptoms gradually improved. A CT of the head in the emergency department showed no acute changes. 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 Cerebral angiogram demonstrated an occluded left vertebral artery in its entirety as well as pre-occlusive stenosis of the right vertebrobasilar junction and possibly the proximal BA. MRI showed left lateral medullary infarct, consistent with Wallenberg Syndrome. He did feel left face and right UE and LE numbness and tingling. TCD showed antegrade flow of BA. The patient was taken back to interventional radiology again on 05/21/2014 for possible posterior circulation stenting however no intervention was performed at that time due to high risk of vessel rupture. The pt developed intractable hiccups which were treated with baclofen, Thorazine, and later Reglan. He was placed on dual antiplatelet therapy for his cerebrovascular disease. He was placed on Lipitor for an elevated LDL. It was felt that his systolic blood pressure should remain in the 130 to 150 range for at least 2-3 weeks to avoid posterior circulation hypoperfusion.   07/22/14 follow up - the patient has been doing well and he was discharge to home from CIR. BP today 146/83. He did not check Bp at home. He still has nystagmus and he felt difficult eye focusing due to nystagmus. Hiccup has been resolved. He continues on ASA and plavix now. No side effects.  Interval History During the  interval time, he is doing well. Has finished PT/OT and will follow up with Dr. Read Drivers next week. Finished off dural antiplatelet and on plavix now. PCP checked LDL down from 121 to 102 so his zocor increased to 40mg . BP today 142/86. Has snoring but no apnea or daytime sleepiness. Still not able to sneeze since stroke.   REVIEW OF SYSTEMS: Full 14 system review of systems performed and notable only for those listed below and in HPI above, all others are negative:  Constitutional:   Cardiovascular:  Ear/Nose/Throat:   Skin:  Eyes:  Eye itching Respiratory:   Gastroitestinal:   Genitourinary:  Hematology/Lymphatic:  Bruise easily Endocrine:  Musculoskeletal:   Allergy/Immunology:   Neurological:   Psychiatric:  Sleep: snoring  The following represents the patient's updated allergies and side effects list: Allergies  Allergen Reactions  . Bee Venom     S/p allergy shots (and no reaction with stings after shots)  . Gabapentin     Nausea, altered mental status  . Lipitor [Atorvastatin] Other (See Comments)    myalgias    The neurologically relevant items on the patient's problem list were reviewed on today's visit.  Neurologic Examination  A problem focused neurological exam (12 or more points of the single system neurologic examination, vital signs counts as 1 point, cranial nerves count for 8 points) was performed.  Blood pressure 142/86, pulse 56, height 5\' 10"  (1.778 m), weight 201 lb 3.2 oz (91.264 kg).  General - Well nourished, well developed, in no apparent distress.  Ophthalmologic - Sharp disc margins OU.  Cardiovascular - Regular rate and  rhythm with no murmur.  Mental Status -  Level of arousal and orientation to time, place, and person were intact. Language including expression, naming, repetition, comprehension was assessed and found intact. Fund of Knowledge was assessed and was intact.  Cranial Nerves II - XII - II - Visual field intact OU. III, IV, VI  - Extraocular movements intact. Left pupil 2.48mm and right pupil 76mm, both reactive to light. Left mild eyelid droop.  V - Facial sensation intact bilaterally. VII - Facial movement intact bilaterally. VIII - Hearing & vestibular intact bilaterally, no nystagmus. X - Palate elevates symmetrically. XI - Chin turning & shoulder shrug intact bilaterally. XII - Tongue protrusion intact.  Motor Strength - The patient's strength was normal in all extremities and pronator drift was absent.  Bulk was normal and fasciculations were absent.   Motor Tone - Muscle tone was assessed at the neck and appendages and was normal.  Reflexes - The patient's reflexes were normal in all extremities and he had no pathological reflexes.  Sensory - Light touch, temperature/pinprick, and Romberg testing were assessed and were normal.    Coordination - The patient had normal movements in the hands and feet with no ataxia or dysmetria.  Tremor was absent.  Gait and Station - The patient's transfers, posture, gait, station, and turns were observed as normal.  Data reviewed: I personally reviewed the images and agree with the radiology interpretations.  Ct Angio Head and neck W/cm &/or Wo Cm  05/20/2014 IMPRESSION: Occluded LEFT vertebral artery, with minimal reconstitution within the foraminal segments. Occlusion of the distal LEFT V3 segment through the intracranial level. 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 However, there is very regular distal RIGHT V4 segment and, apparent included RIGHT posterior inferior cerebellar artery. High-grade stenosis versus occluded proximal basilar artery, small amount of contrast in the mid to distal basilar artery may represent retrograde flow, complete circle of Willis noted. Moderately irregular intracranial vessels  suggests atherosclerosis, less likely vasculopathy. Patent posterior cerebral arteries though, less robust flow related enhancement of the LEFT mid to distal PCA segments.   Ct Head (brain) Wo Contrast  05/20/2014 IMPRESSION: Equivocal hyperdense right MCA. No intracranial hemorrhage. Otherwise normal brain.   Mri and Mra Head Wo Contrast  05/20/2014 IMPRESSION: MRI HEAD: No acute intracranial process ; normal noncontrast MRI of the brain for age. 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 MRA HEAD: 3 mm laterally directed outpouching of LEFT cavernous carotid artery segments, considering proximity to the supraclinoid artery, this is less likely a para ophthalmic aneurysm. LEFT vertebral artery occlusion. In addition, thready irregular distal RIGHT vertebral artery and high-grade stenosis of the proximal basilar artery. 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 Constellation of findings would be better characterized on CT angiogram of the head and neck.   [name] W/o Cm  05/20/2014 IMPRESSION: Acute infarct left posterior lateral medulla.   Ir Angiogram  05/20/2014 IMPRESSION: Occluded left vertebral artery in its entirety. 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 Antegrade flow is seen in the basilar artery to the level of the superior cerebellar arteries. Both posterior communicating arteries open.   05/21/14 - 1.Severe preocclusive stenosis of pro basilar artery, and RT ABJ. 2.Retrograde opacification of LT VBJ from hypoplastic RT VA  TCD - right  vert and BA antegrade flow  2D Echocardiogram - Left ventricle: The cavity size was normal. Wall thickness was normal. Systolic function was normal. The estimated ejection fraction was in the range of 60% to 65%. Doppler parameters are consistent with abnormal left  ventricular relaxation (grade 1 diastolic dysfunction).  Component     Latest Ref Rng 08/31/2012 09/03/2013 05/21/2014 09/19/2014  Cholesterol     0 - 200 mg/dL 188 199 189 160  Triglycerides     0.0 - 149.0 mg/dL 106.0 88.0 148 102.0  HDL Cholesterol     >39.00 mg/dL 37.20 (L) 44.70 38 (L) 37.30 (L)  VLDL     0.0 - 40.0 mg/dL 21.2 17.6 30 20.4  LDL (calc)     0 - 99 mg/dL 130 (H) 137 (H) 121 (H) 102 (H)  Total CHOL/HDL Ratio      5 4 5.0 4  NonHDL         122.70  Hemoglobin A1C     4.8 - 5.6 %   5.6   Mean Plasma Glucose        114     Assessment: As you may recall, he is a 64 y.o. Caucasian male with PMH of HLD was admitted on 05/20/14 due to left lateral medullary infarct (Wallenberg syndrome) on MRI. His CTA and MRA and TCD confirmed left VA occlusion with high grade stenosis of proximal BA and distal right VA, antegrade flow. Put on dural antiplatelet. Continued zocor for HLD. Hiccup and nystagmus resolved. He still not able to sneeze. Subtle right honor's sign. BP better at goal 120-150 due to BA stenosis. Finished dural antiplatelet and now on plavix. Increased zocor to 40mg  as LDL not at goal. He has follow up with Dr. Estanislado Pandy for repeat MRA.    Plan:  - continue plavix and zocor for stroke prevention - Follow up with your primary care physician for stroke risk factor modification. Recommend maintain blood pressure goal 120-150/70-90, diabetes with hemoglobin A1c goal below 6.5% and lipids with LDL cholesterol goal below 70 mg/dL.  - check BP at home, BP goal 120-150 due to BA stenosis - will research on brainstem stroke with sneeze - follow up with Dr. Estanislado Pandy for MRA - follow up in 6 months  No orders of the defined types were placed in this encounter.    Meds ordered this encounter  Medications  . DISCONTD: simvastatin (ZOCOR) 20 MG tablet    Sig:     Patient Instructions  - continue plavix and zocor for stroke prevention - Follow up with your primary care  physician for stroke risk factor modification. Recommend maintain blood pressure goal 120-150/70-90, diabetes with hemoglobin A1c goal below 6.5% and lipids with LDL cholesterol goal below 70 mg/dL.  - check BP at home, BP goal 120-150 due to vessel narrowing - will research on brainstem stroke with sneeze - follow up with Dr. Read Drivers in rehab - follow up in 6 months    Rosalin Hawking, MD PhD [facility] [address] [place], [place] [address] [phone]

## 2014-11-14 NOTE — Patient Instructions (Signed)
-   continue plavix and zocor for stroke prevention - Follow up with your primary care physician for stroke risk factor modification. Recommend maintain blood pressure goal 120-150/70-90, diabetes with hemoglobin A1c goal below 6.5% and lipids with LDL cholesterol goal below 70 mg/dL.  - check BP at home, BP goal 120-150 due to vessel narrowing - will research on brainstem stroke with sneeze - follow up with Dr. Read Drivers in rehab - follow up in 6 months

## 2014-11-25 ENCOUNTER — Ambulatory Visit: Payer: 59 | Admitting: Neurology

## 2014-11-25 ENCOUNTER — Encounter: Payer: Self-pay | Admitting: *Deleted

## 2014-11-25 ENCOUNTER — Other Ambulatory Visit (INDEPENDENT_AMBULATORY_CARE_PROVIDER_SITE_OTHER): Payer: 59

## 2014-11-25 DIAGNOSIS — I639 Cerebral infarction, unspecified: Secondary | ICD-10-CM | POA: Diagnosis not present

## 2014-11-25 LAB — LIPID PANEL
CHOL/HDL RATIO: 4
Cholesterol: 141 mg/dL (ref 0–200)
HDL: 37.7 mg/dL — ABNORMAL LOW (ref >39.00)
LDL Cholesterol: 75 mg/dL (ref 0–99)
NonHDL: 103.59
Triglycerides: 143 mg/dL (ref 0.0–149.0)
VLDL: 28.6 mg/dL (ref 0.0–40.0)

## 2014-12-17 ENCOUNTER — Encounter: Payer: Self-pay | Admitting: Physical Medicine & Rehabilitation

## 2014-12-17 ENCOUNTER — Encounter: Payer: 59 | Attending: Physical Medicine & Rehabilitation

## 2014-12-17 ENCOUNTER — Ambulatory Visit (HOSPITAL_BASED_OUTPATIENT_CLINIC_OR_DEPARTMENT_OTHER): Payer: 59 | Admitting: Physical Medicine & Rehabilitation

## 2014-12-17 VITALS — BP 144/87 | HR 67

## 2014-12-17 DIAGNOSIS — Z8673 Personal history of transient ischemic attack (TIA), and cerebral infarction without residual deficits: Secondary | ICD-10-CM | POA: Diagnosis not present

## 2014-12-17 DIAGNOSIS — I69393 Ataxia following cerebral infarction: Secondary | ICD-10-CM | POA: Insufficient documentation

## 2014-12-17 DIAGNOSIS — H8149 Vertigo of central origin, unspecified ear: Secondary | ICD-10-CM | POA: Insufficient documentation

## 2014-12-17 DIAGNOSIS — G464 Cerebellar stroke syndrome: Secondary | ICD-10-CM | POA: Diagnosis not present

## 2014-12-17 NOTE — Patient Instructions (Signed)
Continue working on leg strength using elliptical as well as walking program. May try using some light  Weights such as a leg press machine

## 2014-12-17 NOTE — Progress Notes (Signed)
Subjective:    Patient ID: Alejandro Schneider, male    DOB: March 29, 1951, 64 y.o.   MRN: [id number]  HPI Return to work no difficulties. Some tiredness going up and down ladders. Working full-time without restrictions. Driving without difficulty Has followed up with neurology and they recommended continuing with blood pressure control, lipid control. Continue current medications. Awaiting schedule for repeat cerebral angiogram with interventional radiology  Neck feels stiff at times no radiating pain into the arms. This does not limit his activity. No arm weakness. No history of neck Surgery. Pain Inventory Average Pain 4 Pain Right Now 4 My pain is intermittent  In the last 24 hours, has pain interfered with the following? General activity 0 Relation with others 0 Enjoyment of life 0 What TIME of day is your pain at its worst? evening Sleep (in general) Good  Pain is worse with: some activites Pain improves with: rest Relief from Meds: no med  Mobility ability to climb steps?  yes do you drive?  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  no  Physicians involved in your care Any changes since last visit?  no   Family History  Problem Relation Age of Onset  . Heart disease Father     CABGx4  . Cancer Father     Prostate (Radiation)  . Prostate cancer Father   . Heart disease Paternal Grandfather     MI  . Hypertension Neg Hx   . Drug abuse Neg Hx   . Alcohol abuse Neg Hx   . Stroke Neg Hx   . Colon cancer Neg Hx   . Diabetes Cousin   . Depression Mother    Social History   Social History  . Marital Status: Married    Spouse Name: N/A  . Number of Children: 1  . Years of Education: N/A   Occupational History  . Lineman [employer]    Social History Main Topics  . Smoking status: Never Smoker   . Smokeless tobacco: Never Used  . Alcohol Use: 0.0 oz/week   Comment: rarely  . Drug Use: No  . Sexual Activity: Yes   Other Topics Concern  . None   Social History Narrative   Married 1974   1 son, Engineer, agricultural ([place] [employer] at [employer]   Past Surgical History  Procedure Laterality Date  . Tonsillectomy  1972  . Inguinal hernia repair  10/16/02    Right  Dr Hassell Done  . Colonoscopy  2006  . Lumbar laminectomy/decompression microdiscectomy Right 05/30/2012    Procedure: LUMBAR LAMINECTOMY/DECOMPRESSION MICRODISCECTOMY 1 LEVEL;  Surgeon: Faythe Ghee, MD;  Location: [facility];  Service: Neurosurgery;  Laterality: Right;  . Radiology with anesthesia N/A 05/21/2014    Procedure: ANGIOPLASTY POSSIBLE STENT;  Surgeon: Rob Hickman, MD;  Location: [facility];  Service: Radiology;  Laterality: N/A;   Past Medical History  Diagnosis Date  . Hyperlipidemia   . CVA (cerebral infarction)    BP 144/87 mmHg  Pulse 67  SpO2 95%  Opioid Risk Score:   Fall Risk Score:  `1  Depression screen PHQ 2/9  Depression screen [facility] 2/9 12/17/2014 10/28/2014 07/12/2014  Decreased Interest 0 0 0  Down, Depressed, Hopeless 0 0 0  PHQ - 2 Score 0 0 0  Altered sleeping - - 0  Tired, decreased  energy - - 1  Change in appetite - - 0  Feeling bad or failure about yourself  - - 0  Trouble concentrating - - 0  Moving slowly or fidgety/restless - - 0  Suicidal thoughts - - 0  PHQ-9 Score - - 1     Review of Systems  Neurological:       Tingling  All other systems reviewed and are negative.      Objective:   Physical Exam  Neurological:  Reflex Scores:      Tricep reflexes are 1+ on the right side and 2+ on the left side.      Bicep reflexes are 1+ on the right side and 2+ on the left side.      Brachioradialis reflexes are 1+ on the right side and 2+ on the left side.      Patellar reflexes are 1+ on the right side and 1+ on the left side.      Achilles reflexes are 1+ on the right side and 1+ on the left  side.  Eyes without evidence of nystagmus  Tone is normal without evidence of spasticity Cerebellar exam shows no evidence of ataxia on finger nose finger or heel to shin testing No evidence of trunkal ataxia  Motor strength is 5/5 in bilateral deltoid, biceps, triceps, finger flexors and extensors, wrist flexors and extensors, hip flexors, knee flexors and extensors, ankle dorsiflexors, plantar flexors, invertors and evertors, toe flexors and extensors  Sensory exam is Reduced pinprick sensation right upper and right lower extremity. Equal pinprick on the face  Cranial nerves II- Visual fields are intact to confrontation testing, no blurring of vision III- no evidence of ptosis, upward, downward and medial gaze intact IV- no vertical diplopia or head tilt V- no facial numbness or masseter weakness VI- no pupil abduction weakness VII- no facial droop, good lid closure VII- normal auditory acuity IX- no pharygeal weakness, gag nl X- no pharyngeal weakness, no hoarseness XI- no trap or SCM weakness XII- no glossal weakness  No fine motor deficits on finger to thumb opposition on the left side Gait has evidence of minimal ataxia with tandem gait. He is able to walk and heel walk With eyes open and eyes closed  Patient is able to look up and walk on his tiptoes maintaining balance and did not complain of dizziness  Romberg is negative Neck has no tenderness to palpation Normal neck range of motion Patient has some pain when looking downward in the back of his neck. No evidence of spine deformity      Assessment & Plan:  1. 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 His main residual deficit is sharp touch and temperature sensation on the right side of the body. Otherwise has had an excellent recovery. At this point I would expect the sensation recovery to be complete and that he'll likely have some ongoing issues with sensation on the right side. Would test temperature using the  left hand rather than the right hand. Return to work has been doing regular job without restrictions. No difficulties other than sometimes feeling tired in the legs when going up and down ladders. Recommend continue conditioning activity such as elliptical  2. Neck pain mainly with movement. Reviewed his CT angiogram looking at the cervical spine. There is no evidence of cervical spinal stenosis centrally. There is cervical facet hypertrophy in the lower cervical area right side greater than left side. He has no radicular symptoms. He is most likely experiencing  some symptoms from cervical spondylosis. He does not have any functional limitation. Do not think this is a stroke related issue. If this worsens patient can follow-up with me

## 2015-01-06 ENCOUNTER — Other Ambulatory Visit (HOSPITAL_COMMUNITY): Payer: Self-pay | Admitting: Interventional Radiology

## 2015-01-06 DIAGNOSIS — I771 Stricture of artery: Secondary | ICD-10-CM

## 2015-01-14 ENCOUNTER — Ambulatory Visit (HOSPITAL_COMMUNITY)
Admission: RE | Admit: 2015-01-14 | Discharge: 2015-01-14 | Disposition: A | Discharge status: Home / Self Care | Payer: 59 | Source: Ambulatory Visit | Attending: Interventional Radiology | Admitting: Interventional Radiology

## 2015-01-14 DIAGNOSIS — I6502 Occlusion and stenosis of left vertebral artery: Secondary | ICD-10-CM | POA: Insufficient documentation

## 2015-01-14 DIAGNOSIS — I771 Stricture of artery: Secondary | ICD-10-CM | POA: Insufficient documentation

## 2015-01-14 LAB — POCT I-STAT CREATININE: Creatinine, Ser: 0.9 mg/dL (ref 0.61–1.24)

## 2015-01-14 MED ORDER — GADOBENATE DIMEGLUMINE 529 MG/ML IV SOLN
20.0000 mL | Freq: Once | INTRAVENOUS | Status: AC | PRN
  Administered 2015-01-14: 17 mL via INTRAVENOUS

## 2015-01-17 ENCOUNTER — Ambulatory Visit (HOSPITAL_COMMUNITY): Payer: 59

## 2015-01-24 ENCOUNTER — Telehealth (HOSPITAL_COMMUNITY): Payer: Self-pay | Admitting: Interventional Radiology

## 2015-01-24 NOTE — Telephone Encounter (Signed)
Called pt, left VM letting him know that he would be due for his next f/u MRI/MRA in 6 months time but that he was having or developed any sx before that to please call us back immediately. JM

## 2015-02-17 ENCOUNTER — Encounter: Payer: Self-pay | Admitting: Gastroenterology

## 2015-04-22 ENCOUNTER — Ambulatory Visit (INDEPENDENT_AMBULATORY_CARE_PROVIDER_SITE_OTHER): Payer: 59 | Admitting: Family Medicine

## 2015-04-22 ENCOUNTER — Encounter: Payer: Self-pay | Admitting: Family Medicine

## 2015-04-22 VITALS — BP 150/80 | HR 79 | Temp 98.3°F | Wt 213.0 lb

## 2015-04-22 DIAGNOSIS — E785 Hyperlipidemia, unspecified: Secondary | ICD-10-CM

## 2015-04-22 DIAGNOSIS — I10 Essential (primary) hypertension: Secondary | ICD-10-CM | POA: Diagnosis not present

## 2015-04-22 MED ORDER — AMLODIPINE BESYLATE 5 MG PO TABS: 5.0000 mg | ORAL_TABLET | Freq: Every day | ORAL | Status: DC

## 2015-04-22 MED ORDER — ROSUVASTATIN CALCIUM 10 MG PO TABS: 10.0000 mg | ORAL_TABLET | Freq: Every day | ORAL | Status: DC

## 2015-04-22 NOTE — Progress Notes (Signed)
Pre visit review using our clinic review tool, if applicable. No additional management support is needed unless otherwise documented below in the visit note.  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  Not on BP meds at this point.  He has passed his exercise standards at work but BP has still been elevated.  Usually at least 150s/90s, usually higher.    Asking about statin change.  Aches on lipitor, similar on simvastatin.  Asking about change to another statin.    Meds, vitals, and allergies reviewed.   ROS: See HPI.  Otherwise, noncontributory.  GEN: nad, alert and oriented HEENT: mucous membranes moist NECK: supple w/o LA CV: rrr.  PULM: ctab, no inc wob ABD: soft, +bs EXT: no edema

## 2015-04-22 NOTE — Patient Instructions (Signed)
Keep working on diet and exercise.  Stop simvastatin.  Add on amlodipine 5mg  a day.  If lightheaded, then cut back to 2.5mg  a day.  If BP still up (>140/>90) after about 1 week then increase to 10mg .   Update me as needed.   After amlodipine is settled, then try crestor.  Let me know if you have aches.  Take care.  Glad to see you.

## 2015-04-23 NOTE — Assessment & Plan Note (Signed)
Stop simva, change to crestor.  rx sent.  He agrees.  Update me as needed.

## 2015-04-23 NOTE — Assessment & Plan Note (Signed)
Add on amlodpine and he'll update me re: BP as needed.  He agrees.

## 2015-05-26 ENCOUNTER — Ambulatory Visit (INDEPENDENT_AMBULATORY_CARE_PROVIDER_SITE_OTHER): Payer: 59 | Admitting: Neurology

## 2015-05-26 ENCOUNTER — Encounter: Payer: Self-pay | Admitting: Neurology

## 2015-05-26 VITALS — BP 149/85 | HR 58 | Ht 70.0 in | Wt 212.6 lb

## 2015-05-26 DIAGNOSIS — I63011 Cerebral infarction due to thrombosis of right vertebral artery: Secondary | ICD-10-CM

## 2015-05-26 DIAGNOSIS — I651 Occlusion and stenosis of basilar artery: Secondary | ICD-10-CM

## 2015-05-26 DIAGNOSIS — E785 Hyperlipidemia, unspecified: Secondary | ICD-10-CM

## 2015-05-26 DIAGNOSIS — I671 Cerebral aneurysm, nonruptured: Secondary | ICD-10-CM | POA: Diagnosis not present

## 2015-05-26 DIAGNOSIS — I10 Essential (primary) hypertension: Secondary | ICD-10-CM

## 2015-05-26 NOTE — Progress Notes (Signed)
STROKE NEUROLOGY FOLLOW UP NOTE  NAME: Alejandro Schneider DOB: 08-23-1950  REASON FOR VISIT: stroke follow up HISTORY FROM: pt and chart  Today we had the pleasure of seeing Alejandro Schneider in follow-up at our Neurology Clinic. Pt was accompanied by wife.   History Summary Alejandro Schneider is a 65 y.o. male with hx of HLD admitted for acute onset dizziness, off balance, left face numbness, and vomiting. Symptoms gradually improved. A CT of the head in the emergency department showed no acute changes. 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 Cerebral angiogram demonstrated an occluded left vertebral artery in its entirety as well as pre-occlusive stenosis of the right vertebrobasilar junction and possibly the proximal BA. MRI showed left lateral medullary infarct, consistent with Wallenberg Syndrome. He did feel left face and right UE and LE numbness and tingling. TCD showed antegrade flow of BA. The patient was taken back to interventional radiology again on 05/21/2014 for possible posterior circulation stenting however no intervention was performed at that time due to high risk of vessel rupture. The pt developed intractable hiccups which were treated with baclofen, Thorazine, and later Reglan. He was placed on dual antiplatelet therapy for his cerebrovascular disease. He was placed on Lipitor for an elevated LDL. It was felt that his systolic blood pressure should remain in the 130 to 150 range for at least 2-3 weeks to avoid posterior circulation hypoperfusion.   07/22/14 follow up - the patient has been doing well and he was discharge to home from CIR. BP today 146/83. He did not check Bp at home. He still has nystagmus and he felt difficult eye focusing due to nystagmus. Hiccup has been resolved. He continues on ASA and plavix now. No side effects.  11/14/14 follow up - he is  doing well. Has finished PT/OT and will follow up with Dr. Read D[employer] next week. Finished off dural antiplatelet and on plavix now. PCP checked LDL down from 121 to 102 so his zocor increased to 40mg . BP today 142/86. Has snoring but no apnea or daytime sleepiness. Still not able to sneeze since stroke.  Interval History During the interval time, he is doing well. No stroke like symptoms. His BP in 04/2015 was high at 170, went to see PCP and put on low dose norvasc. Today BP 149/85, within goal range. He still not able to sneeze after the stroke. Had repeat MRA in 01/2015 showed essentially no change of BA, VA stenosis/occlusion. 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  REVIEW OF SYSTEMS: Full 14 system review of systems performed and notable only for those listed below and in HPI above, all others are negative:  Constitutional:   Cardiovascular:  Ear/Nose/Throat:   Skin:  Eyes:  Eye itching Respiratory:   Gastroitestinal:   Genitourinary:  Hematology/Lymphatic:   Endocrine:  Musculoskeletal:   Allergy/Immunology:   Neurological:  Tingling right arm and left face Psychiatric:  Sleep: snoring  The following represents the patient's updated allergies and side effects list: Allergies  Allergen Reactions  . Bee Venom     S/p allergy shots (and no reaction with stings after shots)  . Gabapentin     Nausea, altered mental status  . Lipitor [Atorvastatin] Other (See Comments)    myalgias  . Simvastatin Other (See Comments)    aches    The neurologically relevant items on the patient's  problem list were reviewed on today's visit.  Neurologic Examination  A problem focused neurological exam (12 or more points of the single system neurologic examination, vital signs counts as 1 point, cranial nerves count for 8 points) was performed.  Blood pressure 149/85, pulse 58, height 5\' 10"  (1.778 m), weight 212 lb 9.6 oz (96.435 kg).  General - Well nourished, well  developed, in no apparent distress.  Ophthalmologic - Sharp disc margins OU.  Cardiovascular - Regular rate and rhythm with no murmur.  Mental Status -  Level of arousal and orientation to time, place, and person were intact. Language including expression, naming, repetition, comprehension was assessed and found intact. Fund of Knowledge was assessed and was intact.  Cranial Nerves II - XII - II - Visual field intact OU. III, IV, VI - Extraocular movements intact.  V - left facial intermittent tingling. VII - Facial movement intact bilaterally. VIII - Hearing & vestibular intact bilaterally, no nystagmus. X - Palate elevates symmetrically. XI - Chin turning & shoulder shrug intact bilaterally. XII - Tongue protrusion intact.  Motor Strength - The patient's strength was normal in all extremities and pronator drift was absent.  Bulk was normal and fasciculations were absent.   Motor Tone - Muscle tone was assessed at the neck and appendages and was normal.  Reflexes - The patient's reflexes were normal in all extremities and he had no pathological reflexes.  Sensory - Light touch, temperature/pinprick, and Romberg testing were assessed and were normal.  But right arm intermittent tingling feeling.  Coordination - The patient had normal movements in the hands and feet with no ataxia or dysmetria.  Tremor was absent.  Gait and Station - The patient's transfers, posture, gait, station, and turns were observed as normal.  Data reviewed: I personally reviewed the images and agree with the radiology interpretations.  Ct Angio Head and neck W/cm &/or Wo Cm  05/20/2014 IMPRESSION: Occluded LEFT vertebral artery, with minimal reconstitution within the foraminal segments. Occlusion of the distal LEFT V3 segment through the intracranial level. Somewhat irregular RIGHT vertebral artery concerning for chronic dissection, compounded by extrinsic compression due to degenerative changes cervical  spine, the vessel remains patent within the neck. However, there is very regular distal RIGHT V4 segment and, apparent included RIGHT posterior inferior cerebellar artery. High-grade stenosis versus occluded proximal basilar artery, small amount of contrast in the mid to distal basilar artery may represent retrograde flow, complete circle of Willis noted. Moderately irregular intracranial vessels suggests atherosclerosis, less likely vasculopathy. Patent posterior cerebral arteries though, less robust flow related enhancement of the LEFT mid to distal PCA segments.   Ct Head (brain) Wo Contrast  05/20/2014 IMPRESSION: Equivocal hyperdense right MCA. No intracranial hemorrhage. Otherwise normal brain.   Mri and Mra Head Wo Contrast  05/20/2014 IMPRESSION: MRI HEAD: No acute intracranial process ; normal noncontrast MRI of the brain for age. 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 MRA HEAD: 3 mm laterally directed outpouching of LEFT cavernous carotid artery segments, considering proximity to the supraclinoid artery, this is less likely a para ophthalmic aneurysm. LEFT vertebral artery occlusion. In addition, thready irregular distal RIGHT vertebral artery and high-grade stenosis of the proximal basilar artery. 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 Constellation of findings would be better characterized on CT angiogram of the head and neck.   [name] W/o Cm  05/20/2014 IMPRESSION: Acute infarct left posterior lateral medulla.   Ir  Angiogram  05/20/2014 IMPRESSION: Occluded left vertebral artery in its entirety. 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 Antegrade flow is seen in the basilar artery to the level of the superior cerebellar arteries. Both posterior communicating arteries open.   05/21/14 - 1.Severe  preocclusive stenosis of pro basilar artery, and RT ABJ. 2.Retrograde opacification of LT VBJ from hypoplastic RT VA  TCD - right vert and BA antegrade flow  2D Echocardiogram - Left ventricle: The cavity size was normal. Wall thickness was normal. Systolic function was normal. The estimated ejection fraction was in the range of 60% to 65%. Doppler parameters are consistent with abnormal left ventricular relaxation (grade 1 diastolic dysfunction).  MRI and MRA 01/14/15 Negative for acute infarct. Chronic infarct left posterior lateral medulla and left cerebellum. 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 Moderate stenosis distal right vertebral artery. There is a severe stenosis of the proximal basilar. These findings are similar to the prior study.   Component     Latest Ref Rng 08/31/2012 09/03/2013 05/21/2014 09/19/2014  Cholesterol     0 - 200 mg/dL 188 199 189 160  Triglycerides     0.0 - 149.0 mg/dL 106.0 88.0 148 102.0  HDL Cholesterol     >39.00 mg/dL 37.20 (L) 44.70 38 (L) 37.30 (L)  VLDL     0.0 - 40.0 mg/dL 21.2 17.6 30 20.4  LDL (calc)     0 - 99 mg/dL 130 (H) 137 (H) 121 (H) 102 (H)  Total CHOL/HDL Ratio      5 4 5.0 4  NonHDL         122.70  Hemoglobin A1C     4.8 - 5.6 %   5.6   Mean Plasma Glucose        114     Assessment: As you may recall, he is a 65 y.o. Caucasian male with PMH of HLD was admitted on 05/20/14 due to left lateral medullary infarct (Wallenberg syndrome) on MRI. His CTA and MRA and TCD confirmed left VA occlusion with high grade stenosis of proximal BA and distal right VA, antegrade flow. Put on dural antiplatelet. Continued zocor for HLD. Hiccup and nystagmus resolved. He still not able to sneeze. Subtle right honor's sign. BP better at goal 120-150 due to BA stenosis. Finished dural antiplatelet and now on plavix. Changed from zocor to crestor by PCP. He has follow up with Dr.  Estanislado Pandy and repeat MRA showed no change of proximal BA and distal right VA stenosis and proxima left VA occlusion, but more collateral flow to left VA with small left cavernous ICA aneurysm 2x81mm. BP high at home and add norvasc low dose. Today BP within goal  Plan:  - continue plavix and crestor for stroke prevention - Follow up with your primary care physician for stroke risk factor modification. Recommend maintain blood pressure goal 130-150/70-90, diabetes with hemoglobin A1c goal below 6.5% and lipids with LDL cholesterol goal below 70 mg/dL.  - check BP at home, BP goal 130-150 due to BA stenosis - will research on brainstem stroke with sneeze - continue to follow up with Dr. Estanislado Pandy for small aneurysm - follow up in 6 months  I spent more than 25 minutes of face to face time with the patient. Greater than 50% of time was spent in counseling and coordination of care, reviewing imaging and medication, and discussing BP control and aneurysm follow up.  No orders of the defined types were placed in this encounter.    No orders of the defined types were placed in this encounter.    Patient Instructions  - continue plavix and crestor for stroke prevention - Follow up with your primary care physician for stroke risk factor modification. Recommend maintain blood pressure goal 130-150/70-90, diabetes with hemoglobin A1c goal below 6.5% and lipids with LDL cholesterol goal below 70 mg/dL.  - check BP at home, BP goal 130-150 due to BA stenosis - will research on brainstem stroke with sneeze - follow up with Dr. Estanislado Pandy for small aneurysm - follow up in 6 months    Rosalin Hawking, MD PhD [facility] [address] [place], [place] [address] [phone]

## 2015-05-26 NOTE — Patient Instructions (Signed)
-   continue plavix and crestor for stroke prevention - Follow up with your primary care physician for stroke risk factor modification. Recommend maintain blood pressure goal 130-150/70-90, diabetes with hemoglobin A1c goal below 6.5% and lipids with LDL cholesterol goal below 70 mg/dL.  - check BP at home, BP goal 130-150 due to BA stenosis - will research on brainstem stroke with sneeze - follow up with Dr. Estanislado Pandy for small aneurysm - follow up in 6 months

## 2015-06-12 ENCOUNTER — Ambulatory Visit (INDEPENDENT_AMBULATORY_CARE_PROVIDER_SITE_OTHER): Payer: 59 | Admitting: Family Medicine

## 2015-06-12 ENCOUNTER — Encounter: Payer: Self-pay | Admitting: Family Medicine

## 2015-06-12 VITALS — BP 144/84 | HR 71 | Temp 98.6°F | Wt 208.0 lb

## 2015-06-12 DIAGNOSIS — R21 Rash and other nonspecific skin eruption: Secondary | ICD-10-CM

## 2015-06-12 MED ORDER — CLOTRIMAZOLE-BETAMETHASONE 1-0.05 % EX CREA: 1.0000 "application " | TOPICAL_CREAM | Freq: Two times a day (BID) | CUTANEOUS | Status: DC

## 2015-06-12 NOTE — Patient Instructions (Signed)
Use the cream on the spots until better and then for 2 or 3 more days.  If not better (not healing or still itchy) then let me know.  Take care.  Glad to see you.

## 2015-06-12 NOTE — Assessment & Plan Note (Signed)
Possible tinea.  Has been itching.  Reasonable to try lotrisone and fu prn.  See AVS.  He agrees.  Doesn't appear to be bacterial infection.

## 2015-06-12 NOTE — Progress Notes (Signed)
Pre visit review using our clinic review tool, if applicable. No additional management support is needed unless otherwise documented below in the visit note.  Mult skin spots.  R hand near the R thumb, L forearm, 2 on the L shin.   Lower L shin lesion started first, then the upper lesion- both after bug bites.  Possible insect bite on the R hand.   Has been putting neosporin on all 4 spots.   L leg and R hand lesions itch.  L forearm lesion doesn't itch.    No FCNAVD.  He feels well o/w.    Meds, vitals, and allergies reviewed.   ROS: See HPI.  Otherwise, noncontributory.  nad Superficial lesions noted.  No ulceration.  Locally irritated.  No fluctuance.  R hand, dorsum, 1cm L forearm, 1cm L upper shin 2x3 cm L lower shin 3x4 cm

## 2015-07-17 ENCOUNTER — Other Ambulatory Visit: Payer: Self-pay | Admitting: Family Medicine

## 2015-08-04 ENCOUNTER — Telehealth: Payer: Self-pay | Admitting: Neurology

## 2015-08-04 NOTE — Telephone Encounter (Signed)
Called pt back to his cell phone and home phone, but he was not available in either phone. I left VM for him to call back.  Rosalin Hawking, MD PhD Stroke Neurology 08/04/2015 5:01 PM

## 2015-08-04 NOTE — Telephone Encounter (Signed)
Pt had DOT physical, everything was ok. He needs a letter stating it is ok for him to drive now. Please call pt

## 2015-08-05 ENCOUNTER — Encounter: Payer: Self-pay | Admitting: Neurology

## 2015-08-05 NOTE — Telephone Encounter (Signed)
Pt returned Dr Erlinda Hong call. He can be reached at [phone]

## 2015-08-05 NOTE — Telephone Encounter (Signed)
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 Pt understood but stated that this is the way so far it works.   I prepared the letter and will ask Katrina to call him for pick up.   Rosalin Hawking, MD PhD Stroke Neurology 08/05/2015 5:07 PM

## 2015-08-06 NOTE — Telephone Encounter (Signed)
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

## 2015-09-10 ENCOUNTER — Other Ambulatory Visit (HOSPITAL_COMMUNITY): Payer: Self-pay | Admitting: Interventional Radiology

## 2015-09-10 DIAGNOSIS — I771 Stricture of artery: Secondary | ICD-10-CM

## 2015-09-10 DIAGNOSIS — I6320 Cerebral infarction due to unspecified occlusion or stenosis of unspecified precerebral arteries: Secondary | ICD-10-CM

## 2015-09-18 ENCOUNTER — Other Ambulatory Visit: Payer: Self-pay | Admitting: Radiology

## 2015-09-18 ENCOUNTER — Other Ambulatory Visit: Payer: Self-pay | Admitting: General Surgery

## 2015-09-18 ENCOUNTER — Other Ambulatory Visit: Payer: Self-pay | Admitting: Family Medicine

## 2015-09-18 DIAGNOSIS — Z125 Encounter for screening for malignant neoplasm of prostate: Secondary | ICD-10-CM

## 2015-09-18 DIAGNOSIS — E785 Hyperlipidemia, unspecified: Secondary | ICD-10-CM

## 2015-09-19 ENCOUNTER — Ambulatory Visit (HOSPITAL_COMMUNITY)
Admission: RE | Admit: 2015-09-19 | Discharge: 2015-09-19 | Disposition: A | Discharge status: Home / Self Care | Payer: 59 | Source: Ambulatory Visit | Attending: Interventional Radiology | Admitting: Interventional Radiology

## 2015-09-19 ENCOUNTER — Other Ambulatory Visit (HOSPITAL_COMMUNITY): Payer: Self-pay | Admitting: Interventional Radiology

## 2015-09-19 DIAGNOSIS — E785 Hyperlipidemia, unspecified: Secondary | ICD-10-CM | POA: Diagnosis not present

## 2015-09-19 DIAGNOSIS — I6320 Cerebral infarction due to unspecified occlusion or stenosis of unspecified precerebral arteries: Secondary | ICD-10-CM

## 2015-09-19 DIAGNOSIS — I6502 Occlusion and stenosis of left vertebral artery: Secondary | ICD-10-CM | POA: Insufficient documentation

## 2015-09-19 DIAGNOSIS — I651 Occlusion and stenosis of basilar artery: Secondary | ICD-10-CM | POA: Diagnosis not present

## 2015-09-19 DIAGNOSIS — Z7902 Long term (current) use of antithrombotics/antiplatelets: Secondary | ICD-10-CM | POA: Insufficient documentation

## 2015-09-19 DIAGNOSIS — I771 Stricture of artery: Secondary | ICD-10-CM

## 2015-09-19 DIAGNOSIS — Z8249 Family history of ischemic heart disease and other diseases of the circulatory system: Secondary | ICD-10-CM | POA: Insufficient documentation

## 2015-09-19 DIAGNOSIS — Z8673 Personal history of transient ischemic attack (TIA), and cerebral infarction without residual deficits: Secondary | ICD-10-CM | POA: Insufficient documentation

## 2015-09-19 LAB — BASIC METABOLIC PANEL
ANION GAP: 7 (ref 5–15)
BUN: 11 mg/dL (ref 6–20)
CALCIUM: 9 mg/dL (ref 8.9–10.3)
CO2: 24 mmol/L (ref 22–32)
Chloride: 109 mmol/L (ref 101–111)
Creatinine, Ser: 0.8 mg/dL (ref 0.61–1.24)
Glucose, Bld: 109 mg/dL — ABNORMAL HIGH (ref 65–99)
Potassium: 3.7 mmol/L (ref 3.5–5.1)
SODIUM: 140 mmol/L (ref 135–145)

## 2015-09-19 LAB — CBC
HCT: 42 % (ref 39.0–52.0)
HEMOGLOBIN: 14.3 g/dL (ref 13.0–17.0)
MCH: 29.7 pg (ref 26.0–34.0)
MCHC: 34 g/dL (ref 30.0–36.0)
MCV: 87.1 fL (ref 78.0–100.0)
Platelets: 198 10*3/uL (ref 150–400)
RBC: 4.82 MIL/uL (ref 4.22–5.81)
RDW: 12.6 % (ref 11.5–15.5)
WBC: 5.2 10*3/uL (ref 4.0–10.5)

## 2015-09-19 LAB — APTT: APTT: 29 s (ref 24–37)

## 2015-09-19 LAB — PROTIME-INR
INR: 1 (ref 0.00–1.49)
PROTHROMBIN TIME: 13.4 s (ref 11.6–15.2)

## 2015-09-19 MED ORDER — MIDAZOLAM HCL 2 MG/2ML IJ SOLN
INTRAMUSCULAR | Status: AC
  Filled 2015-09-19: qty 2

## 2015-09-19 MED ORDER — IOPAMIDOL (ISOVUE-300) INJECTION 61%
INTRAVENOUS | Status: AC
  Administered 2015-09-19: 60 mL
  Filled 2015-09-19: qty 150

## 2015-09-19 MED ORDER — VERAPAMIL HCL 2.5 MG/ML IV SOLN
INTRAVENOUS | Status: AC
  Filled 2015-09-19: qty 2

## 2015-09-19 MED ORDER — NITROGLYCERIN 1 MG/10 ML FOR IR/CATH LAB
INTRA_ARTERIAL | Status: AC | PRN
  Administered 2015-09-19: 200 ug via INTRA_ARTERIAL

## 2015-09-19 MED ORDER — MIDAZOLAM HCL 2 MG/2ML IJ SOLN
INTRAMUSCULAR | Status: AC | PRN
  Administered 2015-09-19: 1 mg via INTRAVENOUS

## 2015-09-19 MED ORDER — HEPARIN SODIUM (PORCINE) 1000 UNIT/ML IJ SOLN
INTRAMUSCULAR | Status: AC
  Filled 2015-09-19: qty 1

## 2015-09-19 MED ORDER — NITROGLYCERIN 1 MG/10 ML FOR IR/CATH LAB
INTRA_ARTERIAL | Status: AC
  Filled 2015-09-19: qty 10

## 2015-09-19 MED ORDER — FENTANYL CITRATE (PF) 100 MCG/2ML IJ SOLN
INTRAMUSCULAR | Status: AC | PRN
  Administered 2015-09-19: 25 ug via INTRAVENOUS

## 2015-09-19 MED ORDER — SODIUM CHLORIDE 0.9 % IV SOLN: INTRAVENOUS | Status: AC

## 2015-09-19 MED ORDER — LIDOCAINE HCL 1 % IJ SOLN
INTRAMUSCULAR | Status: AC
Start: 2015-09-19 — End: 2015-09-19
  Administered 2015-09-19: 10 mL
  Filled 2015-09-19: qty 20

## 2015-09-19 MED ORDER — FENTANYL CITRATE (PF) 100 MCG/2ML IJ SOLN
INTRAMUSCULAR | Status: AC
  Filled 2015-09-19: qty 2

## 2015-09-19 MED ORDER — IOPAMIDOL (ISOVUE-300) INJECTION 61%
INTRAVENOUS | Status: AC
  Filled 2015-09-19: qty 50

## 2015-09-19 MED ORDER — VERAPAMIL HCL 2.5 MG/ML IV SOLN
INTRAVENOUS | Status: AC | PRN
  Administered 2015-09-19: 2.5 mg via INTRA_ARTERIAL

## 2015-09-19 MED ORDER — HEPARIN SODIUM (PORCINE) 1000 UNIT/ML IJ SOLN
INTRAMUSCULAR | Status: AC | PRN
  Administered 2015-09-19: 1000 [IU] via INTRAVENOUS

## 2015-09-19 MED ORDER — SODIUM CHLORIDE 0.9 % IV SOLN
INTRAVENOUS | Status: DC
  Administered 2015-09-19: 07:00:00 via INTRAVENOUS

## 2015-09-19 NOTE — Sedation Documentation (Addendum)
T-Band applied to right radial by Dr. Estanislado Pandy. 25ml air.

## 2015-09-19 NOTE — Procedures (Signed)
S/P bilateral CCA and RT Vert artery and Lt subclavian arteriograms. RT Radial artery approach. Findings. 1Occluded LT Vert A . 2.Severe stenosis of RT VBJ distal to RT PICA. Retrograde floew into Lt VBJ to Lt PICSA from RT [place]. 3.Approx 82mm x 2.4 mm Lt ICa cav segment aneurysm.

## 2015-09-19 NOTE — H&P (Signed)
Chief Complaint: Stroke Vertebral stenosis/occlusion  Referring Physician(s): Deveshwar,Sanjeev  Supervising Physician: Luanne Bras  Patient Status: Outpatient  History of Present Illness: Alejandro Schneider is a 65 y.o. male right-handed gentleman who is status post left medullary infarct in February of last year.  He is here today for follow up carotid/cerebral angiography.  He underwent Vertebral angiogram by Dr. Estanislado Pandy 05/21/2014. This revealed occluded right posterior-inferior cerebellar artery distal to its origin.  Severe pre-occlusive stenosis of the right vertebrobasilar junction just proximal to the confluence to form the basilar artery.  95-97% stenosis of the proximal basilar artery just distal to the confluence.  Retrograde opacification of the left vertebrobasilar junction and left posterior-inferior cerebellar artery from the right vertebral artery injection.  He has continued to improve from a neurological standpoint.  He feels well today. No recent fever or chills.   He is still taking Plavix.   Past Medical History  Diagnosis Date  . Hyperlipidemia   . CVA (cerebral infarction)   . Stroke [facility])     Past Surgical History  Procedure Laterality Date  . Tonsillectomy  1972  . Inguinal hernia repair  10/16/02    Right  Dr Hassell Done  . Colonoscopy  2006  . Lumbar laminectomy/decompression microdiscectomy Right 05/30/2012    Procedure: LUMBAR LAMINECTOMY/DECOMPRESSION MICRODISCECTOMY 1 LEVEL;  Surgeon: Faythe Ghee, MD;  Location: [facility];  Service: Neurosurgery;  Laterality: Right;  . Radiology with anesthesia N/A 05/21/2014    Procedure: ANGIOPLASTY POSSIBLE STENT;  Surgeon: Rob Hickman, MD;  Location: [place];  Service: Radiology;  Laterality: N/A;    Allergies: Bee venom; Gabapentin; Lipitor; and Simvastatin  Medications: Prior to Admission medications   Medication Sig Start Date End Date Taking? Authorizing Provider    amLODipine (NORVASC) 5 MG tablet Take 1 tablet (5 mg total) by mouth daily. 04/22/15  Yes Tonia Ghent, MD  clopidogrel (PLAVIX) 75 MG tablet TAKE 1 TABLET (75 MG TOTAL) BY MOUTH DAILY. 07/17/15  Yes Tonia Ghent, MD  Multiple Vitamins-Minerals (MULTIVITAMIN,TX-MINERALS) tablet Take 1 tablet by mouth daily.     Yes Historical Provider, MD  rosuvastatin (CRESTOR) 10 MG tablet Take 1 tablet (10 mg total) by mouth daily. 04/22/15  Yes Tonia Ghent, MD     Family History  Problem Relation Age of Onset  . Heart disease Father     CABGx4  . Cancer Father     Prostate (Radiation)  . Prostate cancer Father   . Heart disease Paternal Grandfather     MI  . Hypertension Neg Hx   . Drug abuse Neg Hx   . Alcohol abuse Neg Hx   . Colon cancer Neg Hx   . Diabetes Cousin   . Depression Mother   . Stroke Maternal Grandmother     Social History   Social History  . Marital Status: Married    Spouse Name: N/A  . Number of Children: 1  . Years of Education: N/A   Occupational History  . Lineman [employer]    Social History Main Topics  . Smoking status: Never Smoker   . Smokeless tobacco: Never Used  . Alcohol Use: 1.8 oz/week    1 Glasses of wine, 1 Cans of beer, 1 Shots of liquor per week     Comment: rarely  . Drug Use: No  . Sexual Activity: Yes   Other Topics Concern  . 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  HENT: Negative.   Respiratory: Negative for cough and shortness of breath.   Cardiovascular: Negative for chest pain.  Gastrointestinal: Negative for nausea, vomiting and abdominal pain.  Genitourinary: Negative.   Musculoskeletal: Negative.   Neurological: Negative.   Hematological: Negative.    Psychiatric/Behavioral: Negative.     Vital Signs: BP 151/88 mmHg  Pulse 56  Temp(Src) 97.8 F (36.6 C) (Oral)  Resp 16  Ht 5\' 10"  (1.778 m)  Wt 200 lb (90.719 kg)  BMI 28.70 kg/m2  SpO2 99%  Physical Exam  Constitutional: He is oriented to person, place, and time. He appears well-developed and well-nourished.  HENT:  Head: Normocephalic and atraumatic.  Eyes: EOM are normal.  Neck: Normal range of motion. Neck supple.  Cardiovascular: Normal rate, regular rhythm and normal heart sounds.   Pulmonary/Chest: Effort normal and breath sounds normal. No respiratory distress.  Abdominal: Soft. Bowel sounds are normal. He exhibits no distension.  Musculoskeletal: Normal range of motion.  Neurological: He is alert and oriented to person, place, and time.  Skin: Skin is warm and dry.  Psychiatric: He has a normal mood and affect. His behavior is normal. Judgment and thought content normal.  Vitals reviewed.   Mallampati Score:  MD Evaluation Airway: WNL Heart: WNL Abdomen: WNL Chest/ Lungs: WNL ASA  Classification: 2 Mallampati/Airway Score: One  Imaging: No results found.  Labs:  CBC:  Recent Labs  09/19/15 0700  WBC 5.2  HGB 14.3  HCT 42.0  PLT 198    COAGS: No results for input(s): INR, APTT in the last 8760 hours.  BMP:  Recent Labs  01/14/15 1519  CREATININE 0.90    LIVER FUNCTION TESTS: No results for input(s): BILITOT, AST, ALT, ALKPHOS, PROT, ALBUMIN in the last 8760 hours.  TUMOR MARKERS: No results for input(s): AFPTM, CEA, CA199, CHROMGRNA in the last 8760 hours.  Assessment and Plan:  History of left medullary infarct in February of last year with verebral angiography which revealed occluded right posterior-inferior cerebellar artery distal to its origin.  Severe pre-occlusive stenosis of the right vertebrobasilar junction just proximal to the confluence to form the basilar artery.  95-97% stenosis of the proximal basilar artery  just distal to the confluence.  Retrograde opacification of the left vertebrobasilar junction and left posterior-inferior cerebellar artery from the right vertebral artery injection.  Will proceed with Cerebral angiography for follow up today by Dr. Estanislado Pandy.  Risks and Benefits discussed with the patient including, but not limited to bleeding, infection, vascular injury, contrast induced renal failure, stroke or even death.  All of the patient's questions were answered, patient is agreeable to proceed. Consent signed and in chart.   Electronically Signed: Murrell Redden PA-C 09/19/2015, 8:03 AM   I spent a total of  25 Minutes in face to face in clinical consultation, greater than 50% of which was counseling/coordinating care for cerebral angiography.

## 2015-09-19 NOTE — Discharge Instructions (Signed)
Radial Site Care °Refer to this sheet in the next few weeks. These instructions provide you with information about caring for yourself after your procedure. Your health care provider may also give you more specific instructions. Your treatment has been planned according to current medical practices, but problems sometimes occur. Call your health care provider if you have any problems or questions after your procedure. °WHAT TO EXPECT AFTER THE PROCEDURE °After your procedure, it is typical to have the following: °· Bruising at the radial site that usually fades within 1-2 weeks. °· Blood collecting in the tissue (hematoma) that may be painful to the touch. It should usually decrease in size and tenderness within 1-2 weeks. °HOME CARE INSTRUCTIONS °· Take medicines only as directed by your health care provider. °· You may shower 24-48 hours after the procedure or as directed by your health care provider. Remove the bandage (dressing) and gently wash the site with plain soap and water. Pat the area dry with a clean towel. Do not rub the site, because this may cause bleeding. °· Do not take baths, swim, or use a hot tub until your health care provider approves. °· Check your insertion site every day for redness, swelling, or drainage. °· Do not apply powder or lotion to the site. °· Do not flex or bend the affected arm for 24 hours or as directed by your health care provider. °· Do not push or pull heavy objects with the affected arm for 24 hours or as directed by your health care provider. °· Do not lift over 10 lb (4.5 kg) for 5 days after your procedure or as directed by your health care provider. °· Ask your health care provider when it is okay to: °¨ Return to work or school. °¨ Resume usual physical activities or sports. °¨ Resume sexual activity. °· Do not drive home if you are discharged the same day as the procedure. Have someone else drive you. °· You may drive 24 hours after the procedure unless otherwise  instructed by your health care provider. °· Do not operate machinery or power tools for 24 hours after the procedure. °· If your procedure was done as an outpatient procedure, which means that you went home the same day as your procedure, a responsible adult should be with you for the first 24 hours after you arrive home. °· Keep all follow-up visits as directed by your health care provider. This is important. °SEEK MEDICAL CARE IF: °· You have a fever. °· You have chills. °· You have increased bleeding from the radial site. Hold pressure on the site. °SEEK IMMEDIATE MEDICAL CARE IF: °· You have unusual pain at the radial site. °· You have redness, warmth, or swelling at the radial site. °· You have drainage (other than a small amount of blood on the dressing) from the radial site. °· The radial site is bleeding, and the bleeding does not stop after 30 minutes of holding steady pressure on the site. °· Your arm or hand becomes pale, cool, tingly, or numb. °  °This information is not intended to replace advice given to you by your health care provider. Make sure you discuss any questions you have with your health care provider. °  °Document Released: 04/24/2010 Document Revised: 04/12/2014 Document Reviewed: 10/08/2013 °Elsevier Interactive Patient Education ©2016 [employer] °Radial Site Care °Refer to this sheet in the next few weeks. These instructions provide you with information about caring for yourself after your procedure. Your health care provider   may also give you more specific instructions. Your treatment has been planned according to current medical practices, but problems sometimes occur. Call your health care provider if you have any problems or questions after your procedure. °WHAT TO EXPECT AFTER THE PROCEDURE °After your procedure, it is typical to have the following: °· Bruising at the radial site that usually fades within 1-2 weeks. °· Blood collecting in the tissue (hematoma) that may be painful  to the touch. It should usually decrease in size and tenderness within 1-2 weeks. °HOME CARE INSTRUCTIONS °· Take medicines only as directed by your health care provider. °· You may shower 24-48 hours after the procedure or as directed by your health care provider. Remove the bandage (dressing) and gently wash the site with plain soap and water. Pat the area dry with a clean towel. Do not rub the site, because this may cause bleeding. °· Do not take baths, swim, or use a hot tub until your health care provider approves. °· Check your insertion site every day for redness, swelling, or drainage. °· Do not apply powder or lotion to the site. °· Do not flex or bend the affected arm for 24 hours or as directed by your health care provider. °· Do not push or pull heavy objects with the affected arm for 24 hours or as directed by your health care provider. °· Do not lift over 10 lb (4.5 kg) for 5 days after your procedure or as directed by your health care provider. °· Ask your health care provider when it is okay to: °¨ Return to work or school. °¨ Resume usual physical activities or sports. °¨ Resume sexual activity. °· Do not drive home if you are discharged the same day as the procedure. Have someone else drive you. °· You may drive 24 hours after the procedure unless otherwise instructed by your health care provider. °· Do not operate machinery or power tools for 24 hours after the procedure. °· If your procedure was done as an outpatient procedure, which means that you went home the same day as your procedure, a responsible adult should be with you for the first 24 hours after you arrive home. °· Keep all follow-up visits as directed by your health care provider. This is important. °SEEK MEDICAL CARE IF: °· You have a fever. °· You have chills. °· You have increased bleeding from the radial site. Hold pressure on the site. °SEEK IMMEDIATE MEDICAL CARE IF: °· You have unusual pain at the radial site. °· You have  redness, warmth, or swelling at the radial site. °· You have drainage (other than a small amount of blood on the dressing) from the radial site. °· The radial site is bleeding, and the bleeding does not stop after 30 minutes of holding steady pressure on the site. °· Your arm or hand becomes pale, cool, tingly, or numb. °  °This information is not intended to replace advice given to you by your health care provider. Make sure you discuss any questions you have with your health care provider. °  °Document Released: 04/24/2010 Document Revised: 04/12/2014 Document Reviewed: 10/08/2013 °Elsevier Interactive Patient Education ©2016 [employer] ° °

## 2015-09-23 ENCOUNTER — Other Ambulatory Visit (INDEPENDENT_AMBULATORY_CARE_PROVIDER_SITE_OTHER): Payer: 59

## 2015-09-23 DIAGNOSIS — Z1159 Encounter for screening for other viral diseases: Secondary | ICD-10-CM

## 2015-09-23 DIAGNOSIS — Z125 Encounter for screening for malignant neoplasm of prostate: Secondary | ICD-10-CM | POA: Diagnosis not present

## 2015-09-23 DIAGNOSIS — E785 Hyperlipidemia, unspecified: Secondary | ICD-10-CM

## 2015-09-23 LAB — COMPREHENSIVE METABOLIC PANEL
ALBUMIN: 4.3 g/dL (ref 3.5–5.2)
ALK PHOS: 69 U/L (ref 39–117)
ALT: 22 U/L (ref 0–53)
AST: 18 U/L (ref 0–37)
BILIRUBIN TOTAL: 0.4 mg/dL (ref 0.2–1.2)
BUN: 15 mg/dL (ref 6–23)
CALCIUM: 9.4 mg/dL (ref 8.4–10.5)
CHLORIDE: 105 meq/L (ref 96–112)
CO2: 24 mEq/L (ref 19–32)
CREATININE: 0.85 mg/dL (ref 0.40–1.50)
GFR: 96.23 mL/min (ref >60.00)
Glucose, Bld: 114 mg/dL — ABNORMAL HIGH (ref 70–99)
Potassium: 4.5 mEq/L (ref 3.5–5.1)
Sodium: 141 mEq/L (ref 135–145)
TOTAL PROTEIN: 6.6 g/dL (ref 6.0–8.3)

## 2015-09-23 LAB — LIPID PANEL
CHOL/HDL RATIO: 4
CHOLESTEROL: 130 mg/dL (ref 0–200)
HDL: 34.4 mg/dL — ABNORMAL LOW (ref >39.00)
NonHDL: 95.78
TRIGLYCERIDES: 309 mg/dL — AB (ref 0.0–149.0)
VLDL: 61.8 mg/dL — ABNORMAL HIGH (ref 0.0–40.0)

## 2015-09-23 LAB — PSA: PSA: 2.16 ng/mL (ref 0.10–4.00)

## 2015-09-23 LAB — LDL CHOLESTEROL, DIRECT: LDL DIRECT: 70 mg/dL

## 2015-09-24 LAB — HEPATITIS C ANTIBODY: HCV AB: NEGATIVE

## 2015-09-30 ENCOUNTER — Encounter: Payer: Self-pay | Admitting: Family Medicine

## 2015-09-30 ENCOUNTER — Ambulatory Visit (INDEPENDENT_AMBULATORY_CARE_PROVIDER_SITE_OTHER): Payer: 59 | Admitting: Family Medicine

## 2015-09-30 VITALS — BP 124/76 | HR 73 | Temp 98.4°F | Ht 70.0 in | Wt 205.2 lb

## 2015-09-30 DIAGNOSIS — I10 Essential (primary) hypertension: Secondary | ICD-10-CM

## 2015-09-30 DIAGNOSIS — Z0000 Encounter for general adult medical examination without abnormal findings: Secondary | ICD-10-CM

## 2015-09-30 DIAGNOSIS — I63212 Cerebral infarction due to unspecified occlusion or stenosis of left vertebral arteries: Secondary | ICD-10-CM

## 2015-09-30 NOTE — Patient Instructions (Addendum)
Call about a colonoscopy.  If you need a referral then let me know.   Take care.  Glad to see you.  Don't change your meds for now.

## 2015-09-30 NOTE — Progress Notes (Signed)
Pre visit review using our clinic review tool, if applicable. No additional management support is needed unless otherwise documented below in the visit note.  CPE- See plan.  Routine anticipatory guidance given to patient.  See health maintenance. Tetanus 2014 Shingles 2015 Flu prev done PNA due at 65, d/w pt.   Colonosocpy 2006.  D/w pt about f/u.   psa wnl. D/w pt.  Living will d/w pt- wife designated if patient were incapacitated.  Diet and exercise d/w pt. Exercise as tolerated, diet is good.  HCV neg.  D/w pt.   [facility]. D/w pt.    Hypertension:    Using medication without problems or lightheadedness: yes Chest pain with exertion:no Edema:no Short of breath:no Labs d/w pt.   TG and sugar up.  Had ice cream prior to labs.    H/o CVA.  Still on plavix.  Some R quad pain, unclear if from statin- had been noted since the CVA, so this is likely not from statin as he doesn't have other aches.  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    Recent angiogram d/w pt.   The angiographic findings remain stable compared to the study of July 15, 2014.  Clinically the patient remains unchanged without new symptoms.  With this clinical stability, and angiographic stability, and it was decided to continue with conservative management with follow up MRI of the brain and MRA of the brain in about 6 months.  PMH and SH reviewed  Meds, vitals, and allergies reviewed.   ROS: Per HPI.  Unless specifically indicated otherwise in HPI, the patient denies:  General: fever. Eyes: acute vision changes ENT: sore throat Cardiovascular: chest pain Respiratory: SOB GI: vomiting GU: dysuria Musculoskeletal: acute back pain Derm: acute rash Neuro: acute motor dysfunction Psych: worsening mood Endocrine: polydipsia Heme: bleeding Allergy: hayfever  GEN: nad, alert and oriented HEENT: mucous membranes moist NECK: supple w/o LA CV: rrr. PULM:  ctab, no inc wob ABD: soft, +bs EXT: no edema SKIN: no acute rash

## 2015-10-02 NOTE — Assessment & Plan Note (Signed)
Controlled, continue as is.  

## 2015-10-02 NOTE — Assessment & Plan Note (Signed)
Tetanus 2014 Shingles 2015 Flu prev done PNA due at 65, d/w pt.   Colonosocpy 2006.  D/w pt about f/u.   psa wnl. D/w pt.  Living will d/w pt- wife designated if patient were incapacitated.  Diet and exercise d/w pt. Exercise as tolerated, diet is good.  HCV neg.  D/w pt.   [facility].

## 2015-10-02 NOTE — Assessment & Plan Note (Addendum)
I would continue current meds.   BP okay.  Labs d/w pt.   TG and sugar up.  Had ice cream prior to labs.  Otherwise LDL okay.   Angiogram d/w pt.  App help of all involved.

## 2015-10-17 ENCOUNTER — Encounter: Payer: Self-pay | Admitting: Gastroenterology

## 2015-11-05 ENCOUNTER — Telehealth: Payer: Self-pay

## 2015-11-05 ENCOUNTER — Encounter: Payer: Self-pay | Admitting: Gastroenterology

## 2015-11-05 ENCOUNTER — Ambulatory Visit (INDEPENDENT_AMBULATORY_CARE_PROVIDER_SITE_OTHER): Payer: 59 | Admitting: Gastroenterology

## 2015-11-05 VITALS — BP 136/80 | HR 64 | Ht 70.0 in | Wt 209.0 lb

## 2015-11-05 DIAGNOSIS — Z1211 Encounter for screening for malignant neoplasm of colon: Secondary | ICD-10-CM

## 2015-11-05 DIAGNOSIS — Z1212 Encounter for screening for malignant neoplasm of rectum: Secondary | ICD-10-CM | POA: Diagnosis not present

## 2015-11-05 DIAGNOSIS — Z8673 Personal history of transient ischemic attack (TIA), and cerebral infarction without residual deficits: Secondary | ICD-10-CM | POA: Diagnosis not present

## 2015-11-05 MED ORDER — NA SULFATE-K SULFATE-MG SULF 17.5-3.13-1.6 GM/177ML PO SOLN: 1.0000 | Freq: Once | ORAL | 0 refills | Status: AC

## 2015-11-05 NOTE — Progress Notes (Signed)
Reasonable to hold plavix for 5 doses prior to the procedure.  I'll defer restart to GI.   Appreciate help of GI for this pleasant patient.  Routed back to GI.   GSD

## 2015-11-05 NOTE — Progress Notes (Signed)
    History of Present Illness: This is a 65 year old male referred by Tonia Ghent, MD for the evaluation of CRC screening. He underwent colonoscopy in December 2006 for routine screening which was normal except for internal hemorrhoids. He has no gastrointestinal complaints. He suffered a CVA in February 2016 and has been maintained on Plavix since then. Denies weight loss, abdominal pain, constipation, diarrhea, change in stool caliber, melena, hematochezia, nausea, vomiting, dysphagia, reflux symptoms, chest pain.  Review of Systems: Pertinent positive and negative review of systems were noted in the above HPI section. All other review of systems were otherwise negative.  Current Medications, Allergies, Past Medical History, Past Surgical History, Family History and Social History were reviewed in [employer] record.  Physical Exam: General: Well developed, well nourished, no acute distress Head: Normocephalic and atraumatic Eyes:  sclerae anicteric, EOMI Ears: Normal auditory acuity Mouth: No deformity or lesions Neck: Supple, no masses or thyromegaly Lungs: Clear throughout to auscultation Heart: Regular rate and rhythm; no murmurs, rubs or bruits Abdomen: Soft, non tender and non distended. No masses, hepatosplenomegaly or hernias noted. Normal Bowel sounds Rectal: deferred to colonoscopy Musculoskeletal: Symmetrical with no gross deformities  Skin: No lesions on visible extremities Pulses:  Normal pulses noted Extremities: No clubbing, cyanosis, edema or deformities noted Neurological: Alert oriented x 4, grossly nonfocal Cervical Nodes:  No significant cervical adenopathy Inguinal Nodes: No significant inguinal adenopathy Psychological:  Alert and cooperative. Normal mood and affect  Assessment and Recommendations:  1. CRC screening, average risk. Schedule colonoscopy. The risks (including bleeding, perforation, infection, missed lesions, medication  reactions and possible hospitalization or surgery if complications occur), benefits, and alternatives to colonoscopy with possible biopsy and possible polypectomy were discussed with the patient and they consent to proceed.   2. History of CVA. Hold Plavix 5 days before procedure - will instruct when and how to resume after procedure. Low but real risk of cardiovascular event such as heart attack, stroke, embolism, thrombosis or ischemia/infarct of other organs off Plavix explained and need to seek urgent help if this occurs. The patient consents to proceed. Will communicate by phone or EMR with patient's prescribing provider to confirm that holding Plavix is reasonable in this case.     cc: Tonia Ghent, MD [address] [place], [place] [address]

## 2015-11-05 NOTE — Telephone Encounter (Signed)
I agree with Dr. Damita Dunnings. He may stop plavix 5 days prior to his incoming procedure. However, pt has to be aware that it carries a small but acceptable peri-operative risk for TIA/stroke while off plavix. If it is appropriate, we recommend ASA 81mg  daily for the days pt is off plavix. plavix can be resumed post-procedure if hemodynamically stable. We also recommend to avoid hypotension perioperatively due to high grade stenosis of basilar artery. Thanks.   Rosalin Hawking, MD PhD Stroke Neurology 11/05/2015 9:37 PM

## 2015-11-05 NOTE — Telephone Encounter (Signed)
Tonia Ghent, MD at 11/05/2015 9:30 AM   Status: Signed    Reasonable to hold plavix for 5 doses prior to the procedure.  I'll defer restart to GI.   Appreciate help of GI for this pleasant patient.  Routed back to GI.   GSD

## 2015-11-05 NOTE — Patient Instructions (Signed)
You have been scheduled for a colonoscopy. Please follow written instructions given to you at your visit today.  Please pick up your prep supplies at the pharmacy within the next 1-3 days. If you use inhalers (even only as needed), please bring them with you on the day of your procedure. Your physician has requested that you go to www.startemmi.com and enter the access code given to you at your visit today. This web site gives a general overview about your procedure. However, you should still follow specific instructions given to you by our office regarding your preparation for the procedure.  Thank you for choosing me and [facility].  Malcolm T. Stark, Jr., MD., FACG  

## 2015-11-05 NOTE — Telephone Encounter (Signed)
  11/05/2015   RE: Alejandro Schneider DOB: 10-21-1950 MRN: [id number]   Dear Dr. Erlinda Hong,    We have scheduled the above patient for an endoscopic procedure. Our records show that he is on anticoagulation therapy.   Please advise as to how long the patient may come off his therapy of Plavix prior to the procedure, which is scheduled for 12/15/15.  Please route your answer to Marlon Pel, [place].  Sincerely,    Marlon Pel, CMA

## 2015-11-06 NOTE — Telephone Encounter (Signed)
Left a message for patient to return my call. 

## 2015-11-07 NOTE — Telephone Encounter (Signed)
Informed patient of Dr. Phoebe Sharps recommendations for patient to hold Plavix 5 days prior to his procedure and to start a 81 mg ASA those days he is off his Plavix. Patient verbalized understanding.

## 2015-11-07 NOTE — Telephone Encounter (Signed)
Left a message for patient to return my call. 

## 2015-11-15 IMAGING — CR DG SHOULDER 1V*L*
1 series · 1 of 1 positions shown · non-contrast
Comparison: None.

CLINICAL DATA: [id number].[id number] ([id number]) - Nontraumatic shoulder pain,
leftPain is anterior to shoulder joint and is painful to lift arm
Symptoms for several weeksPatient says he does lots of lifting at
workPatient states he had a stroke and that's why he was addmited [REDACTED].Hx of surgery in Lumbar 2 years ago

EXAM:
LEFT SHOULDER - 1 VIEW

[AP]
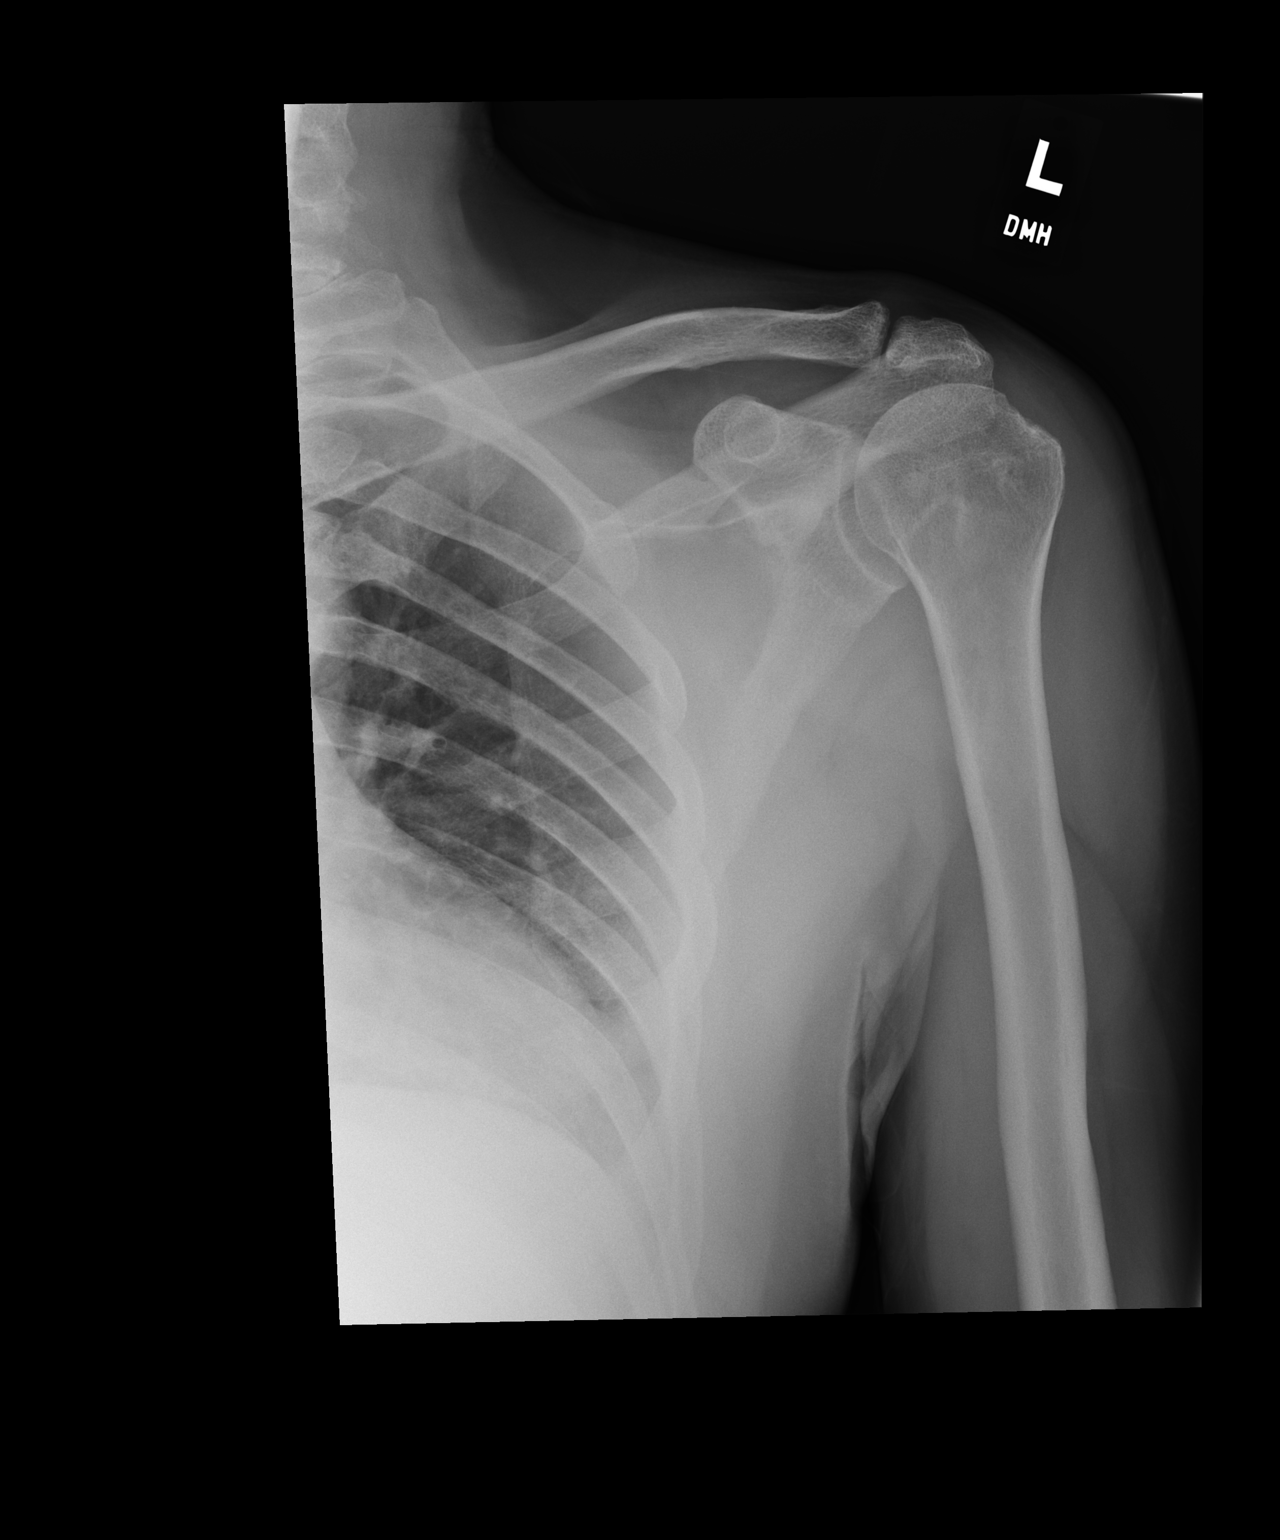

[1 of 1 positions shown; findings below may reference images not displayed]

FINDINGS: No fracture. No bone lesion. Glenohumeral and AC joints are normally
aligned. There are mild AC joint osteoarthritic changes.

Subacromial space is narrowed to 5 mm.

Soft tissues are unremarkable.
IMPRESSION: No fracture or dislocation. Mild AC joint osteoarthritis. Narrowing
of the subacromial space to 5 mm suggesting an underlying rotator
cuff tendon tear. Consider follow-up shoulder MRI.

## 2015-11-24 ENCOUNTER — Encounter: Payer: Self-pay | Admitting: Neurology

## 2015-11-24 ENCOUNTER — Ambulatory Visit (INDEPENDENT_AMBULATORY_CARE_PROVIDER_SITE_OTHER): Payer: 59 | Admitting: Neurology

## 2015-11-24 VITALS — BP 132/73 | HR 55 | Ht 70.0 in | Wt 212.8 lb

## 2015-11-24 DIAGNOSIS — I10 Essential (primary) hypertension: Secondary | ICD-10-CM | POA: Diagnosis not present

## 2015-11-24 DIAGNOSIS — G464 Cerebellar stroke syndrome: Secondary | ICD-10-CM

## 2015-11-24 DIAGNOSIS — E785 Hyperlipidemia, unspecified: Secondary | ICD-10-CM | POA: Diagnosis not present

## 2015-11-24 DIAGNOSIS — I63012 Cerebral infarction due to thrombosis of left vertebral artery: Secondary | ICD-10-CM

## 2015-11-24 DIAGNOSIS — I651 Occlusion and stenosis of basilar artery: Secondary | ICD-10-CM | POA: Diagnosis not present

## 2015-11-24 DIAGNOSIS — G463 Brain stem stroke syndrome: Secondary | ICD-10-CM

## 2015-11-24 NOTE — Progress Notes (Signed)
STROKE NEUROLOGY FOLLOW UP NOTE  NAME: Alejandro Schneider DOB: 12/01/1950  REASON FOR VISIT: stroke follow up HISTORY FROM: pt and chart  Today we had the pleasure of seeing Alejandro Schneider in follow-up at our Neurology Clinic. Pt was accompanied by wife.   History Summary Alejandro Schneider is a 65 y.o. male with hx of HLD admitted for acute onset dizziness, off balance, left face numbness, and vomiting. Symptoms gradually improved. A CT of the head in the emergency department showed no acute changes. 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 Cerebral angiogram demonstrated an occluded left vertebral artery in its entirety as well as pre-occlusive stenosis of the right vertebrobasilar junction and possibly the proximal BA. MRI showed left lateral medullary infarct, consistent with Wallenberg Syndrome. He did feel left face and right UE and LE numbness and tingling. TCD showed antegrade flow of BA. The patient was taken back to interventional radiology again on 05/21/2014 for possible posterior circulation stenting however no intervention was performed at that time due to high risk of vessel rupture. The pt developed intractable hiccups which were treated with baclofen, Thorazine, and later Reglan. He was placed on dual antiplatelet therapy for his cerebrovascular disease. He was placed on Lipitor for an elevated LDL. It was felt that his systolic blood pressure should remain in the 130 to 150 range for at least 2-3 weeks to avoid posterior circulation hypoperfusion.   07/22/14 follow up - the patient has been doing well and he was discharge to home from CIR. BP today 146/83. He did not check Bp at home. He still has nystagmus and he felt difficult eye focusing due to nystagmus. Hiccup has been resolved. He continues on ASA and plavix now. No side effects.  11/14/14 follow up - he is  doing well. Has finished PT/OT and will follow up with Dr. Read Drivers next week. Finished off dural antiplatelet and on plavix now. PCP checked LDL down from 121 to 102 so his zocor increased to 40mg . BP today 142/86. Has snoring but no apnea or daytime sleepiness. Still not able to sneeze since stroke.  05/26/15 follow up - he is doing well. No stroke like symptoms. His BP in 04/2015 was high at 170, went to see PCP and put on low dose norvasc. Today BP 149/85, within goal range. He still not able to sneeze after the stroke. Had repeat MRA in 01/2015 showed essentially no change of BA, VA stenosis/occlusion. 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  Interval History During the interval time, pt has been doing well. He followed with Dr. Estanislado Pandy and repeat angio in 09/2015 showed no change of intracranial stenosis and stable size of small aneurysm. Has been back to drive commercial truck. Still not able to sneeze and intermittent right arm tingling. Left face tingling resolved. BP 132/73. No other complains.   REVIEW OF SYSTEMS: Full 14 system review of systems performed and notable only for those listed below and in HPI above, all others are negative:  Constitutional:   Cardiovascular:  Ear/Nose/Throat:   Skin:  Eyes:  Eye itching Respiratory:   Gastroitestinal:   Genitourinary:  Hematology/Lymphatic:   Endocrine:  Musculoskeletal:  Joint pain Allergy/Immunology:   Neurological:   Psychiatric:  Sleep:   The following represents the patient's updated allergies and side effects list: Allergies  Allergen Reactions  . Bee Venom  S/p allergy shots (and no reaction with stings after shots)  . Gabapentin     Nausea, altered mental status  . Lipitor [Atorvastatin] Other (See Comments)    myalgias  . Simvastatin Other (See Comments)    aches    The neurologically relevant items on the patient's problem list were reviewed on today's visit.  Neurologic Examination  A  problem focused neurological exam (12 or more points of the single system neurologic examination, vital signs counts as 1 point, cranial nerves count for 8 points) was performed.  Blood pressure 132/73, pulse (!) 55, height 5\' 10"  (1.778 m), weight 212 lb 12.8 oz (96.5 kg).  General - Well nourished, well developed, in no apparent distress.  Ophthalmologic - Sharp disc margins OU.  Cardiovascular - Regular rate and rhythm with no murmur.  Mental Status -  Level of arousal and orientation to time, place, and person were intact. Language including expression, naming, repetition, comprehension was assessed and found intact. Fund of Knowledge was assessed and was intact.  Cranial Nerves II - XII - II - Visual field intact OU. III, IV, VI - Extraocular movements intact.  V - facial sensation symmetrical. VII - Facial movement intact bilaterally. VIII - Hearing & vestibular intact bilaterally, no nystagmus. X - Palate elevates symmetrically. XI - Chin turning & shoulder shrug intact bilaterally. XII - Tongue protrusion intact.  Motor Strength - The patient's strength was normal in all extremities and pronator drift was absent.  Bulk was normal and fasciculations were absent.   Motor Tone - Muscle tone was assessed at the neck and appendages and was normal.  Reflexes - The patient's reflexes were normal in all extremities and he had no pathological reflexes.  Sensory - Light touch, temperature/pinprick, and Romberg testing were assessed and were normal.  But right arm intermittent tingling feeling.  Coordination - The patient had normal movements in the hands and feet with no ataxia or dysmetria.  Tremor was absent.  Gait and Station - The patient's transfers, posture, gait, station, and turns were observed as normal.  Data reviewed: I personally reviewed the images and agree with the radiology interpretations.  Ct Angio Head and neck W/cm &/or Wo Cm  05/20/2014 IMPRESSION: Occluded  LEFT vertebral artery, with minimal reconstitution within the foraminal segments. Occlusion of the distal LEFT V3 segment through the intracranial level. 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 However, there is very regular distal RIGHT V4 segment and, apparent included RIGHT posterior inferior cerebellar artery. High-grade stenosis versus occluded proximal basilar artery, small amount of contrast in the mid to distal basilar artery may represent retrograde flow, complete circle of Willis noted. Moderately irregular intracranial vessels suggests atherosclerosis, less likely vasculopathy. Patent posterior cerebral arteries though, less robust flow related enhancement of the LEFT mid to distal PCA segments.   Ct Head (brain) Wo Contrast  05/20/2014 IMPRESSION: Equivocal hyperdense right MCA. No intracranial hemorrhage. Otherwise normal brain.   Mri and Mra Head Wo Contrast  05/20/2014 IMPRESSION: MRI HEAD: No acute intracranial process ; normal noncontrast MRI of the brain for age. 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 MRA HEAD: 3 mm laterally directed outpouching of LEFT cavernous carotid artery segments, considering proximity to the supraclinoid artery, this is less likely a para ophthalmic aneurysm. LEFT vertebral artery occlusion. In addition, thready irregular distal RIGHT vertebral artery and high-grade stenosis of  the proximal basilar artery. 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 Constellation of findings would be better characterized on CT angiogram of the head and neck.   [name] W/o Cm  05/20/2014 IMPRESSION: Acute infarct left posterior lateral medulla.   Ir Angiogram  05/20/2014 IMPRESSION: Occluded left  vertebral artery in its entirety. 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 Antegrade flow is seen in the basilar artery to the level of the superior cerebellar arteries. Both posterior communicating arteries open.   05/21/14 - 1.Severe preocclusive stenosis of pro basilar artery, and RT ABJ. 2.Retrograde opacification of LT VBJ from hypoplastic RT VA  TCD - right vert and BA antegrade flow  2D Echocardiogram - Left ventricle: The cavity size was normal. Wall thickness was normal. Systolic function was normal. The estimated ejection fraction was in the range of 60% to 65%. Doppler parameters are consistent with abnormal left ventricular relaxation (grade 1 diastolic dysfunction).  MRI and MRA 01/14/15 Negative for acute infarct. Chronic infarct left posterior lateral medulla and left cerebellum. 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 Moderate stenosis distal right vertebral artery. There is a severe stenosis of the proximal basilar. These findings are similar to the prior study.  Angio 09/2015 Continued presence of complete occlusion of the left vertebral artery. High-grade severe stenosis of the right vertebrobasilar junction with opacification of the right posterior-inferior cerebellar artery. High-grade stenosis the proximal basilar artery with opacification of the mid basilar artery to the level of the superior cerebellar arteries and the anterior-inferior cerebellar arteries. Approximately 3 mm x 2.4 mm saccular aneurysm arising from the superior aspect of the proximal cavernous segment of the left internal carotid artery unchanged. Prominent posterior communicating arteries bilaterally opacifying the posterior cerebral arteries, and the superior cerebellar arteries with mild retrograde opacification of the distal basilar artery as described. The  angiographic findings remain stable compared to the study of July 15, 2014.   Component     Latest Ref Rng 08/31/2012 09/03/2013 05/21/2014 09/19/2014  Cholesterol     0 - 200 mg/dL 188 199 189 160  Triglycerides     0.0 - 149.0 mg/dL 106.0 88.0 148 102.0  HDL Cholesterol     >39.00 mg/dL 37.20 (L) 44.70 38 (L) 37.30 (L)  VLDL     0.0 - 40.0 mg/dL 21.2 17.6 30 20.4  LDL (calc)     0 - 99 mg/dL 130 (H) 137 (H) 121 (H) 102 (H)  Total CHOL/HDL Ratio      5 4 5.0 4  NonHDL         122.70  Hemoglobin A1C     4.8 - 5.6 %   5.6   Mean Plasma Glucose        114     Assessment: As you may recall, he is a 65 y.o. Caucasian male with PMH of HLD was admitted on 05/20/14 due to left lateral medullary infarct (Wallenberg syndrome) on MRI. His CTA and MRA and TCD confirmed left VA occlusion with high grade stenosis of proximal BA and distal right VA, antegrade flow. Put on dural antiplatelet. Continued zocor for HLD. Hiccup and nystagmus resolved. He still not able to sneeze. Subtle left honor's sign. BP better at goal 120-150 due to BA stenosis. Finished dural antiplatelet and now on plavix. Changed from zocor to crestor by PCP. He has follow up with Dr. Estanislado Pandy  and repeat MRA showed no change of proximal BA and distal right VA stenosis and proxima left VA occlusion, but more collateral flow to left VA with small left cavernous ICA aneurysm 2x66mm. BP high at home and add norvasc low dose. Repeat angio in 09/2015 stable. BP within goal.  Plan:  - continue plavix and crestor for stroke prevention - Follow up with your primary care physician for stroke risk factor modification. Recommend maintain blood pressure goal 130-150/70-90, diabetes with hemoglobin A1c goal below 6.5% and lipids with LDL cholesterol goal below 70 mg/dL.  - check BP at home, BP goal 130-150 due to BA stenosis - continue to follow up with Dr. Estanislado Pandy for small aneurysm - healthy diet and regular exercise. Avoid over exertion.  -  will ask for consent for publication if needed in the future.  - follow up as needed.   No orders of the defined types were placed in this encounter.   No orders of the defined types were placed in this encounter.   Patient Instructions  - continue plavix and crestor for stroke prevention - Follow up with your primary care physician for stroke risk factor modification. Recommend maintain blood pressure goal 130-150/70-90, diabetes with hemoglobin A1c goal below 6.5% and lipids with LDL cholesterol goal below 70 mg/dL.  - check BP at home, BP goal 130-150 due to BA stenosis - continue to follow up with Dr. Estanislado Pandy for small aneurysm - healthy diet and regular exercise. Avoid over exertion.  - will ask for consent for publication if needed in the future.  - follow up as needed.   Rosalin Hawking, MD PhD [facility] [address] [place],  [address] [phone]

## 2015-11-24 NOTE — Patient Instructions (Addendum)
-   continue plavix and crestor for stroke prevention - Follow up with your primary care physician for stroke risk factor modification. Recommend maintain blood pressure goal 130-150/70-90, diabetes with hemoglobin A1c goal below 6.5% and lipids with LDL cholesterol goal below 70 mg/dL.  - check BP at home, BP goal 130-150 due to BA stenosis - continue to follow up with Dr. Estanislado Pandy for small aneurysm - healthy diet and regular exercise. Avoid over exertion.  - will ask for consent for publication if needed in the future.  - follow up as needed.

## 2015-12-01 ENCOUNTER — Encounter: Payer: Self-pay | Admitting: Gastroenterology

## 2015-12-15 ENCOUNTER — Encounter: Payer: Self-pay | Admitting: Gastroenterology

## 2015-12-15 ENCOUNTER — Ambulatory Visit (AMBULATORY_SURGERY_CENTER): Payer: 59 | Admitting: Gastroenterology

## 2015-12-15 VITALS — BP 125/79 | HR 59 | Temp 97.1°F | Resp 13 | Ht 69.0 in | Wt 209.0 lb

## 2015-12-15 DIAGNOSIS — D123 Benign neoplasm of transverse colon: Secondary | ICD-10-CM

## 2015-12-15 DIAGNOSIS — Z1211 Encounter for screening for malignant neoplasm of colon: Secondary | ICD-10-CM | POA: Diagnosis not present

## 2015-12-15 DIAGNOSIS — D122 Benign neoplasm of ascending colon: Secondary | ICD-10-CM | POA: Diagnosis not present

## 2015-12-15 MED ORDER — SODIUM CHLORIDE 0.9 % IV SOLN: 500.0000 mL | INTRAVENOUS | Status: DC

## 2015-12-15 NOTE — Op Note (Signed)
[place] Patient Name: Alejandro Schneider Procedure Date: 12/15/2015 2:49 PM MRN: [id number] Endoscopist: Ladene Artist , MD Age: 65 Referring MD:  Date of Birth: [birth year] Gender: Male Account #: [id number] Procedure:                Colonoscopy Indications:              Screening for colorectal malignant neoplasm Medicines:                Monitored Anesthesia Care Procedure:                Pre-Anesthesia Assessment:                           - Prior to the procedure, a History and Physical                            was performed, and patient medications and                            allergies were reviewed. The patient's tolerance of                            previous anesthesia was also reviewed. The risks                            and benefits of the procedure and the sedation                            options and risks were discussed with the patient.                            All questions were answered, and informed consent                            was obtained. Prior Anticoagulants: The patient has                            taken no previous anticoagulant or antiplatelet                            agents. ASA Grade Assessment: III - A patient with                            severe systemic disease. After reviewing the risks                            and benefits, the patient was deemed in                            satisfactory condition to undergo the procedure.                           After obtaining informed consent, the colonoscope  was passed under direct vision. Throughout the                            procedure, the patient's blood pressure, pulse, and                            oxygen saturations were monitored continuously. The                            Model PCF-H190DL [id number]) scope was introduced                            through the anus and advanced to the the cecum,                            identified by  appendiceal orifice and ileocecal                            valve. The ileocecal valve, appendiceal orifice,                            and rectum were photographed. The quality of the                            bowel preparation was excellent. The colonoscopy                            was performed without difficulty. The patient                            tolerated the procedure well. Scope In: 3:02:58 PM Scope Out: 3:18:35 PM Scope Withdrawal Time: 0 hours 12 minutes 58 seconds  Total Procedure Duration: 0 hours 15 minutes 37 seconds  Findings:                 A 5 mm polyp was found in the ascending colon. The                            polyp was sessile. The polyp was removed with a                            cold biopsy forceps. Resection and retrieval were                            complete.                           The exam was otherwise without abnormality on                            direct and retroflexion views.                           A 6 mm polyp was found in the transverse colon. The  polyp was sessile. The polyp was removed with a                            cold snare. Resection and retrieval were complete. Complications:            No immediate complications. Estimated blood loss:                            None. Estimated Blood Loss:     Estimated blood loss: none. Impression:               - One 5 mm polyp in the ascending colon, removed                            with a cold biopsy forceps. Resected and retrieved.                           - The examination was otherwise normal on direct                            and retroflexion views.                           - One 6 mm polyp in the transverse colon, removed                            with a cold snare. Resected and retrieved. Recommendation:           - Repeat colonoscopy in 5 years for surveillance if                            polyp(s) are precancerous, otherwise 10 years.                            - Patient has a contact number available for                            emergencies. The signs and symptoms of potential                            delayed complications were discussed with the                            patient. Return to normal activities tomorrow.                            Written discharge instructions were provided to the                            patient.                           - Resume previous diet.                           -  Continue present medications.                           - Await pathology results.                           - Resume Plavix (clopidogrel) at prior dose                            tomorrow. Refer to managing physician for further                            adjustment of therapy. Ladene Artist, MD 12/15/2015 3:27:05 PM This report has been signed electronically.

## 2015-12-15 NOTE — Progress Notes (Signed)
Called to room to assist during endoscopic procedure.  Patient ID and intended procedure confirmed with present staff. Received instructions for my participation in the procedure from the performing physician.  

## 2015-12-15 NOTE — Patient Instructions (Signed)
YOU HAD AN ENDOSCOPIC PROCEDURE TODAY AT [facility]:   Refer to the procedure report that was given to you for any specific questions about what was found during the examination.  If the procedure report does not answer your questions, please call your gastroenterologist to clarify.  If you requested that your care partner not be given the details of your procedure findings, then the procedure report has been included in a sealed envelope for you to review at your convenience later.  YOU SHOULD EXPECT: Some feelings of bloating in the abdomen. Passage of more gas than usual.  Walking can help get rid of the air that was put into your GI tract during the procedure and reduce the bloating. If you had a lower endoscopy (such as a colonoscopy or flexible sigmoidoscopy) you may notice spotting of blood in your stool or on the toilet paper. If you underwent a bowel prep for your procedure, you may not have a normal bowel movement for a few days.  Please Note:  You might notice some irritation and congestion in your nose or some drainage.  This is from the oxygen used during your procedure.  There is no need for concern and it should clear up in a day or so.  SYMPTOMS TO REPORT IMMEDIATELY:   Following lower endoscopy (colonoscopy or flexible sigmoidoscopy):  Excessive amounts of blood in the stool  Significant tenderness or worsening of abdominal pains  Swelling of the abdomen that is new, acute  Fever of 100F or higher    For urgent or emergent issues, a gastroenterologist can be reached at any hour by calling [phone].   DIET:  We do recommend a small meal at first, but then you may proceed to your regular diet.  Drink plenty of fluids but you should avoid alcoholic beverages for 24 hours.  ACTIVITY:  You should plan to take it easy for the rest of today and you should NOT DRIVE or use heavy machinery until tomorrow (because of the sedation medicines used during the test).     FOLLOW UP: Our staff will call the number listed on your records the next business day following your procedure to check on you and address any questions or concerns that you may have regarding the information given to you following your procedure. If we do not reach you, we will leave a message.  However, if you are feeling well and you are not experiencing any problems, there is no need to return our call.  We will assume that you have returned to your regular daily activities without incident.  If any biopsies were taken you will be contacted by phone or by letter within the next 1-3 weeks.  Please call us at [phone] if you have not heard about the biopsies in 3 weeks.    SIGNATURES/CONFIDENTIALITY: You and/or your care partner have signed paperwork which will be entered into your electronic medical record.  These signatures attest to the fact that that the information above on your After Visit Summary has been reviewed and is understood.  Full responsibility of the confidentiality of this discharge information lies with you and/or your care-partner.    Resume PLAVIX tomorrow at prior dose,resume remainder of medications. Information given on polyps.

## 2015-12-15 NOTE — Progress Notes (Signed)
To PACU  Awake and alert.  Report to RN 

## 2015-12-15 NOTE — Progress Notes (Signed)
Per Pt his last dose of PLAVIX was 12-09-15.  He started taking aspirin 81 mg 12-10-15.  Corky Sing

## 2015-12-16 ENCOUNTER — Telehealth: Payer: Self-pay

## 2015-12-16 NOTE — Telephone Encounter (Signed)
  Follow up Call-  Call back number 12/15/2015  Post procedure Call Back phone  # [phone] cell  Permission to leave phone message Yes  Some recent data might be hidden     Patient questions:  Do you have a fever, pain , or abdominal swelling? No. Pain Score  0 *  Have you tolerated food without any problems? Yes.    Have you been able to return to your normal activities? Yes.    Do you have any questions about your discharge instructions: Diet   No. Medications  No. Follow up visit  No.  Do you have questions or concerns about your Care? No.  Actions: * If pain score is 4 or above: No action needed, pain <4.

## 2015-12-23 ENCOUNTER — Encounter: Payer: Self-pay | Admitting: Gastroenterology

## 2016-04-17 ENCOUNTER — Other Ambulatory Visit: Payer: Self-pay | Admitting: Family Medicine

## 2016-04-19 ENCOUNTER — Other Ambulatory Visit (HOSPITAL_COMMUNITY): Payer: Self-pay | Admitting: Interventional Radiology

## 2016-04-19 DIAGNOSIS — I771 Stricture of artery: Secondary | ICD-10-CM

## 2016-04-19 NOTE — Telephone Encounter (Signed)
Sent. Thanks.   

## 2016-04-19 NOTE — Telephone Encounter (Signed)
Last office visit 09/30/15 See allergy/contraindication Okay to refill?

## 2016-04-28 ENCOUNTER — Ambulatory Visit (HOSPITAL_COMMUNITY)
Admission: RE | Admit: 2016-04-28 | Discharge: 2016-04-28 | Disposition: A | Discharge status: Home / Self Care | Payer: 59 | Source: Ambulatory Visit | Attending: Interventional Radiology | Admitting: Interventional Radiology

## 2016-04-28 ENCOUNTER — Ambulatory Visit (HOSPITAL_COMMUNITY): Admission: RE | Admit: 2016-04-28 | Payer: 59 | Source: Ambulatory Visit

## 2016-04-28 ENCOUNTER — Encounter (HOSPITAL_COMMUNITY): Payer: Self-pay | Admitting: Radiology

## 2016-04-28 DIAGNOSIS — I771 Stricture of artery: Secondary | ICD-10-CM | POA: Insufficient documentation

## 2016-04-28 DIAGNOSIS — I671 Cerebral aneurysm, nonruptured: Secondary | ICD-10-CM | POA: Diagnosis not present

## 2016-04-28 DIAGNOSIS — I672 Cerebral atherosclerosis: Secondary | ICD-10-CM | POA: Insufficient documentation

## 2016-04-28 LAB — CREATININE, SERUM: CREATININE: 0.78 mg/dL (ref 0.61–1.24)

## 2016-04-28 MED ORDER — GADOBENATE DIMEGLUMINE 529 MG/ML IV SOLN
20.0000 mL | Freq: Once | INTRAVENOUS | Status: AC
  Administered 2016-04-28: 20 mL via INTRAVENOUS

## 2016-05-03 ENCOUNTER — Telehealth (HOSPITAL_COMMUNITY): Payer: Self-pay

## 2016-05-03 NOTE — Telephone Encounter (Signed)
Pt agreed to f/u in 6 months with mri/mra. AW  

## 2016-07-19 ENCOUNTER — Other Ambulatory Visit: Payer: Self-pay | Admitting: Family Medicine

## 2016-07-21 ENCOUNTER — Telehealth: Payer: Self-pay | Admitting: Neurology

## 2016-07-21 NOTE — Telephone Encounter (Signed)
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 Best call back number is [phone]

## 2016-07-21 NOTE — Telephone Encounter (Signed)
IF patient calls back, I left him this message read below:  This is per Dr. Erlinda Hong: Pt was seen [phone] and told to follow up with his PCP for his stroke care. Pt is stable. Pt will need to contact PCP for his DOT letter, if patient has been stroke free.If DOT needs it from the neurologist pt will need to schedule an appt with Dr. Erlinda Hong to receive letter.

## 2016-07-21 NOTE — Telephone Encounter (Signed)
Patient needs letter. Last note from 11/2015, pt to only follow up as needed. Dr. Erlinda Hong can patient continue to receive DOT letters without being seen at [facility]? Please advise thanks.

## 2016-07-22 ENCOUNTER — Telehealth: Payer: Self-pay

## 2016-07-22 NOTE — Telephone Encounter (Signed)
Left detailed message on voicemail. Routed back to Dr. Damita Dunnings.

## 2016-07-22 NOTE — Telephone Encounter (Signed)
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 [employer] does physical for pt. Last year pt got letter from Dr Erlinda Hong but was advised from Dr Phoebe Sharps office will need to get letter this year from PCP. Pt request cb when letter is ready for pick up and pt request to pick up letter by 07/23/16. Last annual 09/30/15.

## 2016-07-22 NOTE — Telephone Encounter (Signed)
Tell him this may not be ready by tomorrow.  I need to review his chart.  I'll work on it as quickly as I can.   Please route back to me to work on this.  Thanks.

## 2016-07-22 NOTE — Telephone Encounter (Signed)
Agree. Thanks.   Rosalin Hawking, MD PhD Stroke Neurology 07/22/2016 5:50 PM

## 2016-07-25 NOTE — Telephone Encounter (Signed)
Letter done.  Please make sure it printed.  Thanks.  

## 2016-07-26 NOTE — Telephone Encounter (Signed)
Letter signed, patient advised and letter placed at front for pickup.

## 2016-09-06 ENCOUNTER — Ambulatory Visit (INDEPENDENT_AMBULATORY_CARE_PROVIDER_SITE_OTHER): Payer: 59 | Admitting: Family Medicine

## 2016-09-06 ENCOUNTER — Encounter: Payer: Self-pay | Admitting: Family Medicine

## 2016-09-06 DIAGNOSIS — M5431 Sciatica, right side: Secondary | ICD-10-CM

## 2016-09-06 MED ORDER — PREDNISONE 20 MG PO TABS: ORAL_TABLET | ORAL | 0 refills | Status: DC

## 2016-09-06 NOTE — Assessment & Plan Note (Signed)
Handout given/discussed re: back stretches as tolerated and start prednisone with food.  Update me if not better.  He agrees.   No need for imaging at this point.  He agrees.  Rationale d/w pt.

## 2016-09-06 NOTE — Progress Notes (Signed)
Back pain.  He thought he could have R sciatica.  Radiation into and down R leg.  Better with icing.  Then return of pain.  Started about 2-3 weeks ago.  H/o similar in the past.  No L sided pain.   No trauma, no trigger.  No FCNAVD.  No B/B sx.  Intermittent pain, not present in the AM but returns with weightbearing/movement.  No rash.  No bruising.    Meds, vitals, and allergies reviewed.   ROS: Per HPI unless specifically indicated in ROS section   nad ncat rrr ctab abd soft, not ttp Back not ttp SLR at time of exam.  S/S grossly intact BLE.   Able to bear weight.

## 2016-09-06 NOTE — Patient Instructions (Signed)
Use the back stretches as tolerated and start prednisone with food.  Update me if not better.  Take care.  Glad to see you.

## 2016-09-19 ENCOUNTER — Other Ambulatory Visit: Payer: Self-pay | Admitting: Family Medicine

## 2016-09-19 DIAGNOSIS — Z125 Encounter for screening for malignant neoplasm of prostate: Secondary | ICD-10-CM

## 2016-09-19 DIAGNOSIS — I10 Essential (primary) hypertension: Secondary | ICD-10-CM

## 2016-09-28 ENCOUNTER — Other Ambulatory Visit (INDEPENDENT_AMBULATORY_CARE_PROVIDER_SITE_OTHER): Payer: 59

## 2016-09-28 DIAGNOSIS — Z125 Encounter for screening for malignant neoplasm of prostate: Secondary | ICD-10-CM | POA: Diagnosis not present

## 2016-09-28 DIAGNOSIS — I10 Essential (primary) hypertension: Secondary | ICD-10-CM

## 2016-09-28 LAB — PSA, MEDICARE: PSA: 2.95 ng/mL (ref 0.10–4.00)

## 2016-09-28 LAB — COMPREHENSIVE METABOLIC PANEL
ALK PHOS: 62 U/L (ref 39–117)
ALT: 20 U/L (ref 0–53)
AST: 17 U/L (ref 0–37)
Albumin: 4.2 g/dL (ref 3.5–5.2)
BUN: 13 mg/dL (ref 6–23)
CHLORIDE: 107 meq/L (ref 96–112)
CO2: 27 mEq/L (ref 19–32)
Calcium: 8.9 mg/dL (ref 8.4–10.5)
Creatinine, Ser: 0.82 mg/dL (ref 0.40–1.50)
GFR: 99.99 mL/min (ref >60.00)
GLUCOSE: 117 mg/dL — AB (ref 70–99)
POTASSIUM: 4 meq/L (ref 3.5–5.1)
SODIUM: 141 meq/L (ref 135–145)
TOTAL PROTEIN: 6.1 g/dL (ref 6.0–8.3)
Total Bilirubin: 0.8 mg/dL (ref 0.2–1.2)

## 2016-09-28 LAB — LIPID PANEL
Cholesterol: 138 mg/dL (ref 0–200)
HDL: 41.2 mg/dL (ref >39.00)
LDL CALC: 82 mg/dL (ref 0–99)
NONHDL: 96.61
Total CHOL/HDL Ratio: 3
Triglycerides: 72 mg/dL (ref 0.0–149.0)
VLDL: 14.4 mg/dL (ref 0.0–40.0)

## 2016-10-01 ENCOUNTER — Encounter: Payer: Self-pay | Admitting: Family Medicine

## 2016-10-01 ENCOUNTER — Ambulatory Visit (INDEPENDENT_AMBULATORY_CARE_PROVIDER_SITE_OTHER): Payer: 59 | Admitting: Family Medicine

## 2016-10-01 VITALS — BP 130/84 | HR 65 | Temp 97.5°F | Ht 69.0 in | Wt 205.0 lb

## 2016-10-01 DIAGNOSIS — Z23 Encounter for immunization: Secondary | ICD-10-CM | POA: Diagnosis not present

## 2016-10-01 DIAGNOSIS — L821 Other seborrheic keratosis: Secondary | ICD-10-CM

## 2016-10-01 DIAGNOSIS — Z0000 Encounter for general adult medical examination without abnormal findings: Secondary | ICD-10-CM

## 2016-10-01 DIAGNOSIS — I639 Cerebral infarction, unspecified: Secondary | ICD-10-CM

## 2016-10-01 DIAGNOSIS — M5431 Sciatica, right side: Secondary | ICD-10-CM

## 2016-10-01 DIAGNOSIS — I10 Essential (primary) hypertension: Secondary | ICD-10-CM

## 2016-10-01 DIAGNOSIS — E785 Hyperlipidemia, unspecified: Secondary | ICD-10-CM

## 2016-10-01 MED ORDER — AMLODIPINE BESYLATE 5 MG PO TABS: 5.0000 mg | ORAL_TABLET | Freq: Every day | ORAL | 3 refills | Status: DC

## 2016-10-01 MED ORDER — ROSUVASTATIN CALCIUM 10 MG PO TABS: 10.0000 mg | ORAL_TABLET | Freq: Every day | ORAL | 3 refills | Status: DC

## 2016-10-01 MED ORDER — CLOPIDOGREL BISULFATE 75 MG PO TABS: ORAL_TABLET | ORAL | 3 refills | Status: DC

## 2016-10-01 NOTE — Patient Instructions (Signed)
Don't change your meds for now.  Update me as needed.  Keep working on diet and exercise.  Take care.  Glad to see you.

## 2016-10-01 NOTE — Progress Notes (Signed)
CPE- See plan.  Routine anticipatory guidance given to patient.  See health maintenance.  The possibility exists that previously documented standard health maintenance information may have been brought forward from a previous encounter into this note.  If needed, that same information has been updated to reflect the current situation based on today's encounter.    Tetanus 2014 Shingles 2015 Flu prev done PNA 2018 Colonosocpy 2017.  psa wnl. D/w pt. FH noted.   Living will d/w pt- wife designated if patient were incapacitated.  Diet and exercise d/w pt. Exercise as tolerated, diet is good.  HCV neg prev.   [facility]  Hypertension:    Using medication without problems or lightheadedness: yes Chest pain with exertion:no Edema:no Short of breath:no  Elevated Cholesterol: Using medications without problems:yes Muscle aches: not from statin Diet compliance: yes Exercise:yes I am not enthused about inc in statin given his hx with other statins, d/w pt.  He can work more on diet for LDL that is mildly above goal.    H/o CVA.  No new sx, d/w pt about secondary prevention.    Sciatica is better than prev, done with prednisone.  Still stretching.    PMH and SH reviewed  Meds, vitals, and allergies reviewed.   ROS: Per HPI.  Unless specifically indicated otherwise in HPI, the patient denies:  General: fever. Eyes: acute vision changes ENT: sore throat Cardiovascular: chest pain Respiratory: SOB GI: vomiting GU: dysuria Musculoskeletal: acute back pain Derm: acute rash Neuro: acute motor dysfunction Psych: worsening mood Endocrine: polydipsia Heme: bleeding Allergy: hayfever  GEN: nad, alert and oriented HEENT: mucous membranes moist NECK: supple w/o LA CV: rrr. 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  9x8 mm dark SK on the back noted, no change in size from prev.   Longstanding.

## 2016-10-03 NOTE — Assessment & Plan Note (Signed)
Tetanus 2014 Shingles 2015 Flu prev done PNA 2018 Colonosocpy 2017.  psa wnl. D/w pt. FH noted.   Living will d/w pt- wife designated if patient were incapacitated.  Diet and exercise d/w pt. Exercise as tolerated, diet is good.  HCV neg prev.   [facility]

## 2016-10-03 NOTE — Assessment & Plan Note (Signed)
Continue current dose of statin. Given his aches on other statins I do not want to increase his dose of Crestor unless we absolutely have to. He agrees. Labs discussed with patient.

## 2016-10-03 NOTE — Assessment & Plan Note (Signed)
Continue current medications for secondary prevention. No new symptoms. He agrees.

## 2016-10-03 NOTE — Assessment & Plan Note (Signed)
Improved from before. Continue stretching. He agrees.

## 2016-10-03 NOTE — Assessment & Plan Note (Signed)
Reasonable control. No change in meds. Labs discussed with patient, especially mildly elevated sugar. He can avoid some carbs. We can recheck periodically.

## 2016-10-03 NOTE — Assessment & Plan Note (Signed)
9x8 mm dark SK on the back noted, no change in size from prev.  Longstanding.

## 2016-10-05 ENCOUNTER — Telehealth: Payer: Self-pay | Admitting: Family Medicine

## 2016-10-05 NOTE — Telephone Encounter (Signed)
-----   Message from Joanell Rising sent at 10/05/2016  9:02 AM EDT ----- Regarding: RE: need for f/u imaging? Yes, the patient is due this month for a follow-up MRI/MRA. My staff will be in contact with him to schedule this very soon. Thanks   ----- Message ----- From: Tonia Ghent, MD Sent: 10/01/2016   9:36 AM To: Luanne Bras, MD Subject: need for f/u imaging?                          Does patient need to come back for f/u imaging?  Please let patient and me know either way.  Thanks.   Brigitte Pulse

## 2016-10-05 NOTE — Telephone Encounter (Signed)
Noted. Thanks.

## 2016-10-27 ENCOUNTER — Telehealth (HOSPITAL_COMMUNITY): Payer: Self-pay

## 2016-10-27 NOTE — Telephone Encounter (Signed)
Called to schedule mra, left message for pt to return call. AW

## 2016-11-02 ENCOUNTER — Other Ambulatory Visit (HOSPITAL_COMMUNITY): Payer: Self-pay | Admitting: Interventional Radiology

## 2016-11-02 DIAGNOSIS — I771 Stricture of artery: Secondary | ICD-10-CM

## 2016-11-12 ENCOUNTER — Ambulatory Visit (HOSPITAL_COMMUNITY): Payer: 59

## 2016-11-15 ENCOUNTER — Ambulatory Visit (HOSPITAL_COMMUNITY)
Admission: RE | Admit: 2016-11-15 | Discharge: 2016-11-15 | Disposition: A | Discharge status: Home / Self Care | Payer: 59 | Source: Ambulatory Visit | Attending: Interventional Radiology | Admitting: Interventional Radiology

## 2016-11-15 DIAGNOSIS — I771 Stricture of artery: Secondary | ICD-10-CM | POA: Insufficient documentation

## 2016-11-15 DIAGNOSIS — I671 Cerebral aneurysm, nonruptured: Secondary | ICD-10-CM | POA: Diagnosis not present

## 2016-11-15 DIAGNOSIS — I6503 Occlusion and stenosis of bilateral vertebral arteries: Secondary | ICD-10-CM | POA: Insufficient documentation

## 2016-11-15 DIAGNOSIS — I651 Occlusion and stenosis of basilar artery: Secondary | ICD-10-CM | POA: Insufficient documentation

## 2016-11-23 ENCOUNTER — Telehealth (HOSPITAL_COMMUNITY): Payer: Self-pay

## 2016-11-23 NOTE — Telephone Encounter (Signed)
Left message for pt to return call. AW 

## 2016-11-23 NOTE — Telephone Encounter (Signed)
Pt agreed to f/u in 6 months with mra. AW  

## 2017-05-11 ENCOUNTER — Telehealth (HOSPITAL_COMMUNITY): Payer: Self-pay

## 2017-05-11 NOTE — Telephone Encounter (Signed)
Called to schedule f/u mra, no answer, left message. AW

## 2017-05-12 ENCOUNTER — Other Ambulatory Visit (HOSPITAL_COMMUNITY): Payer: Self-pay | Admitting: Interventional Radiology

## 2017-05-12 DIAGNOSIS — I771 Stricture of artery: Secondary | ICD-10-CM

## 2017-05-31 ENCOUNTER — Ambulatory Visit (HOSPITAL_COMMUNITY)
Admission: RE | Admit: 2017-05-31 | Discharge: 2017-05-31 | Disposition: A | Discharge status: Home / Self Care | Payer: 59 | Source: Ambulatory Visit | Attending: Interventional Radiology | Admitting: Interventional Radiology

## 2017-05-31 ENCOUNTER — Encounter (HOSPITAL_COMMUNITY): Payer: Self-pay | Admitting: Radiology

## 2017-05-31 DIAGNOSIS — I671 Cerebral aneurysm, nonruptured: Secondary | ICD-10-CM | POA: Insufficient documentation

## 2017-05-31 DIAGNOSIS — I771 Stricture of artery: Secondary | ICD-10-CM | POA: Insufficient documentation

## 2017-06-14 ENCOUNTER — Telehealth (HOSPITAL_COMMUNITY): Payer: Self-pay

## 2017-06-14 NOTE — Telephone Encounter (Signed)
Left message for pt to f/u in 6 months with mri. AW

## 2017-10-13 ENCOUNTER — Other Ambulatory Visit (INDEPENDENT_AMBULATORY_CARE_PROVIDER_SITE_OTHER): Payer: 59

## 2017-10-13 ENCOUNTER — Other Ambulatory Visit: Payer: Self-pay | Admitting: Family Medicine

## 2017-10-13 DIAGNOSIS — E785 Hyperlipidemia, unspecified: Secondary | ICD-10-CM

## 2017-10-13 DIAGNOSIS — Z125 Encounter for screening for malignant neoplasm of prostate: Secondary | ICD-10-CM | POA: Diagnosis not present

## 2017-10-13 LAB — LIPID PANEL
Cholesterol: 148 mg/dL (ref 0–200)
HDL: 45.4 mg/dL (ref >39.00)
LDL Cholesterol: 88 mg/dL (ref 0–99)
NONHDL: 103.02
Total CHOL/HDL Ratio: 3
Triglycerides: 77 mg/dL (ref 0.0–149.0)
VLDL: 15.4 mg/dL (ref 0.0–40.0)

## 2017-10-13 LAB — COMPREHENSIVE METABOLIC PANEL
ALBUMIN: 4.4 g/dL (ref 3.5–5.2)
ALK PHOS: 65 U/L (ref 39–117)
ALT: 27 U/L (ref 0–53)
AST: 19 U/L (ref 0–37)
BILIRUBIN TOTAL: 0.9 mg/dL (ref 0.2–1.2)
BUN: 16 mg/dL (ref 6–23)
CO2: 24 mEq/L (ref 19–32)
Calcium: 9.2 mg/dL (ref 8.4–10.5)
Chloride: 106 mEq/L (ref 96–112)
Creatinine, Ser: 0.79 mg/dL (ref 0.40–1.50)
GFR: 104.05 mL/min (ref >60.00)
GLUCOSE: 126 mg/dL — AB (ref 70–99)
Potassium: 4.2 mEq/L (ref 3.5–5.1)
Sodium: 139 mEq/L (ref 135–145)
TOTAL PROTEIN: 6.8 g/dL (ref 6.0–8.3)

## 2017-10-13 LAB — PSA, MEDICARE: PSA: 2.42 ng/mL (ref 0.10–4.00)

## 2017-10-19 ENCOUNTER — Encounter: Payer: Self-pay | Admitting: Family Medicine

## 2017-10-19 ENCOUNTER — Ambulatory Visit (INDEPENDENT_AMBULATORY_CARE_PROVIDER_SITE_OTHER): Payer: 59 | Admitting: Family Medicine

## 2017-10-19 VITALS — BP 136/80 | HR 84 | Temp 98.7°F | Resp 18 | Ht 69.0 in | Wt 213.4 lb

## 2017-10-19 DIAGNOSIS — R739 Hyperglycemia, unspecified: Secondary | ICD-10-CM | POA: Diagnosis not present

## 2017-10-19 DIAGNOSIS — Z23 Encounter for immunization: Secondary | ICD-10-CM | POA: Diagnosis not present

## 2017-10-19 DIAGNOSIS — E785 Hyperlipidemia, unspecified: Secondary | ICD-10-CM

## 2017-10-19 DIAGNOSIS — Z0000 Encounter for general adult medical examination without abnormal findings: Secondary | ICD-10-CM | POA: Diagnosis not present

## 2017-10-19 DIAGNOSIS — I10 Essential (primary) hypertension: Secondary | ICD-10-CM

## 2017-10-19 DIAGNOSIS — Z7189 Other specified counseling: Secondary | ICD-10-CM

## 2017-10-19 LAB — POCT GLYCOSYLATED HEMOGLOBIN (HGB A1C): Hemoglobin A1C: 5.4 % (ref 4.0–5.6)

## 2017-10-19 MED ORDER — ROSUVASTATIN CALCIUM 10 MG PO TABS: 20.0000 mg | ORAL_TABLET | Freq: Every day | ORAL | Status: DC

## 2017-10-19 MED ORDER — AMLODIPINE BESYLATE 5 MG PO TABS: 5.0000 mg | ORAL_TABLET | Freq: Every day | ORAL | 3 refills | Status: DC

## 2017-10-19 MED ORDER — CLOPIDOGREL BISULFATE 75 MG PO TABS: ORAL_TABLET | ORAL | 3 refills | Status: DC

## 2017-10-19 NOTE — Patient Instructions (Addendum)
Increase the crestor to 20mg  at night.  If you can tolerate that, then recheck fasting lipids in about 2 months.  If you can't tolerate it, then let me know.   If/when you need a refill, let us know.  We can change to 20mg  tabs later on if needed.  Don't change your other meds.  Take care.  Glad to see you.

## 2017-10-19 NOTE — Progress Notes (Signed)
CPE- See plan.  Routine anticipatory guidance given to patient.  See health maintenance.  The possibility exists that previously documented standard health maintenance information may have been brought forward from a previous encounter into this note.  If needed, that same information has been updated to reflect the current situation based on today's encounter.    Tetanus 2014 zostavax 2015- shingrix out of stock Flu prev done PNA 2019 Colonosocpy 2017.  PSA wnl. D/w pt. FH noted.   Living will d/w pt- wife designated if patient were incapacitated.  Diet and exercise d/w pt. Exercise as tolerated, diet is good.  HCV neg prev.   [facility]    Hypertension:    Using medication without problems or lightheadedness: yes Chest pain with exertion: no Edema: no Short of breath: no  Elevated Cholesterol: Using medications without problems: yes Muscle aches: no Diet compliance: yes Exercise:yes Labs d/w pt.    Hyperglycemia d/w pt.  He had been drinking some sodas.  D/w pt about tapering those.  A1c d/w pt at OV, not diabetic by A1c.   PMH and SH reviewed  Meds, vitals, and allergies reviewed.   ROS: Per HPI.  Unless specifically indicated otherwise in HPI, the patient denies:  General: fever. Eyes: acute vision changes ENT: sore throat Cardiovascular: chest pain Respiratory: SOB GI: vomiting GU: dysuria Musculoskeletal: acute back pain Derm: acute rash Neuro: acute motor dysfunction Psych: worsening mood Endocrine: polydipsia Heme: bleeding Allergy: hayfever  GEN: nad, alert and oriented HEENT: mucous membranes moist NECK: supple w/o LA CV: rrr. PULM: ctab, no inc wob ABD: soft, +bs EXT: no edema SKIN: no acute rash

## 2017-10-24 DIAGNOSIS — R739 Hyperglycemia, unspecified: Secondary | ICD-10-CM | POA: Insufficient documentation

## 2017-10-24 NOTE — Assessment & Plan Note (Signed)
Labs discussed with patient.  He will try to increase his Crestor and see if tolerated.  If tolerated recheck lipids later on.  If not tolerated then he will let me know.  Routine cautions given.  He agrees.

## 2017-10-24 NOTE — Assessment & Plan Note (Signed)
Living will d/w pt- wife designated if patient were incapacitated.   

## 2017-10-24 NOTE — Assessment & Plan Note (Signed)
Hyperglycemia d/w pt.  He had been drinking some sodas.  D/w pt about tapering those.  A1c d/w pt at OV, not diabetic by A1c.  Discussed diet, etc.

## 2017-10-24 NOTE — Assessment & Plan Note (Signed)
Reasonable control.  Continue as is.  He agrees. Labs d/w pt.

## 2017-10-24 NOTE — Assessment & Plan Note (Signed)
Tetanus 2014 zostavax 2015- shingrix out of stock Flu prev done PNA 2019 Colonosocpy 2017.  PSA wnl. D/w pt. FH noted.   Living will d/w pt- wife designated if patient were incapacitated.  Diet and exercise d/w pt. Exercise as tolerated, diet is good.  HCV neg prev.   [facility]

## 2017-11-14 ENCOUNTER — Other Ambulatory Visit: Payer: Self-pay | Admitting: Family Medicine

## 2017-11-16 ENCOUNTER — Other Ambulatory Visit (HOSPITAL_COMMUNITY): Payer: Self-pay | Admitting: Interventional Radiology

## 2017-11-28 ENCOUNTER — Other Ambulatory Visit (HOSPITAL_COMMUNITY): Payer: Self-pay | Admitting: Interventional Radiology

## 2017-11-28 DIAGNOSIS — I729 Aneurysm of unspecified site: Secondary | ICD-10-CM

## 2017-11-28 DIAGNOSIS — I771 Stricture of artery: Secondary | ICD-10-CM

## 2017-12-12 ENCOUNTER — Ambulatory Visit (HOSPITAL_COMMUNITY)
Admission: RE | Admit: 2017-12-12 | Discharge: 2017-12-12 | Disposition: A | Discharge status: Home / Self Care | Payer: Medicare Other | Source: Ambulatory Visit | Attending: Interventional Radiology | Admitting: Interventional Radiology

## 2017-12-12 DIAGNOSIS — I6509 Occlusion and stenosis of unspecified vertebral artery: Secondary | ICD-10-CM | POA: Diagnosis not present

## 2017-12-12 DIAGNOSIS — I771 Stricture of artery: Secondary | ICD-10-CM

## 2017-12-12 DIAGNOSIS — I729 Aneurysm of unspecified site: Secondary | ICD-10-CM

## 2017-12-12 NOTE — Progress Notes (Signed)
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  Referring Physician(s): None  Supervising Physician: Luanne Bras  Patient Status: [facility] - Out-pt  History of Present Illness: Alejandro Schneider is a 67 y.o. male with a past medical history of hypertension, hyperlipidemia, CVA 05/2014, internal hemorrhoids, and sleep apnea. He is known to [facility] and has been followed by Dr. Estanislado Pandy since 05/2014. He presented to [facility] ED with an acute CVA 05/2014 and underwent an image-guided diagnostic cerebral angiogram 05/20/2014 with Dr. Estanislado Pandy.  Diagnostic cerebral angiogram 05/20/2014: 1. Occluded left vertebral artery in its entirety. 2. 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 Antegrade flow is seen in the basilar artery to the level of the superior cerebellar arteries. 3. Both posterior communicating arteries open.  Patient's right vertebrobasilar junction stenosis has since been monitored with routine imaging scans and diagnostic cerebral angiogram.  MRA head 05/31/2017: 1. Stable from 2018. 2. Chronic left vertebral artery occlusion with diminutive right vertebral artery filling the bilateral PICA. There is severe stenosis at the right vertebrobasilar junction and proximal basilar with faint intermittent flow in the basilar. Large bilateral posterior communicating arteries with good PCA flow and retrograde bilateral superior cerebellar filling. 3. 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  Patient presents today to discuss his recent MRA results. Patient awake and alert sitting in chair. Accompanied by wife. Complains of left arm numbness/tingling, stable since CVA 05/2014. Complains of left arm weakness, stable since CVA 05/2014. Denies headache, dizziness, vision changes, hearing changes, tinnitus, or speech difficulty.  Patient is  currently taking Plavix 75 mg once daily.   Past Medical History:  Diagnosis Date  . CVA (cerebral infarction)   . Hyperlipidemia   . Hypertension   . Internal hemorrhoids   . Stroke [facility])     Past Surgical History:  Procedure Laterality Date  . COLONOSCOPY  2006  . INGUINAL HERNIA REPAIR  10/16/02   Right  Dr Hassell Done  . LUMBAR LAMINECTOMY/DECOMPRESSION MICRODISCECTOMY Right 05/30/2012   Procedure: LUMBAR LAMINECTOMY/DECOMPRESSION MICRODISCECTOMY 1 LEVEL;  Surgeon: Faythe Ghee, MD;  Location: [facility];  Service: Neurosurgery;  Laterality: Right;  . RADIOLOGY WITH ANESTHESIA N/A 05/21/2014   Procedure: ANGIOPLASTY POSSIBLE STENT;  Surgeon: Rob Hickman, MD;  Location: [place];  Service: Radiology;  Laterality: N/A;  . TONSILLECTOMY  1972    Allergies: Bee venom; Gabapentin; Lipitor [atorvastatin]; and Simvastatin  Medications: Prior to Admission medications   Medication Sig Start Date End Date Taking? Authorizing Provider  amLODipine (NORVASC) 5 MG tablet Take 1 tablet (5 mg total) by mouth daily. 10/19/17   Tonia Ghent, MD  clopidogrel (PLAVIX) 75 MG tablet TAKE 1 TABLET (75 MG TOTAL) BY MOUTH DAILY. 10/19/17   Tonia Ghent, MD  Multiple Vitamins-Minerals (MULTIVITAMIN,TX-MINERALS) tablet Take 1 tablet by mouth daily.      [provider]  Omega-3 Fatty Acids (FISH OIL) 1000 MG CAPS Take 1,000 mg by mouth daily.    [provider]  rosuvastatin (CRESTOR) 10 MG tablet Take 2 tablets (20 mg total) by mouth daily. 10/19/17   Tonia Ghent, MD  rosuvastatin (CRESTOR) 10 MG tablet TAKE 1 TABLET BY MOUTH EVERY DAY 11/15/17   Tonia Ghent, MD     Family History  Problem Relation Age of Onset  . Heart disease Father        CABGx4  . Cancer Father  Prostate (Radiation)  . Prostate cancer Father   . Heart disease Paternal Grandfather        MI  . Depression Mother   . Stroke Maternal Grandmother   . Prostate cancer Brother   .  Diabetes Cousin   . Hypertension Neg Hx   . Drug abuse Neg Hx   . Alcohol abuse Neg Hx   . Colon cancer Neg Hx   . Esophageal cancer Neg Hx   . Pancreatic cancer Neg Hx   . Rectal cancer Neg Hx   . Stomach cancer Neg Hx     Social History   Socioeconomic History  . Marital status: Married    Spouse name: Not on file  . Number of children: 1  . Years of education: Not on file  . Highest education level: Not on file  Occupational History  . Occupation: Counselling psychologist  Social Needs  . Financial resource strain: Not on file  . Food insecurity:    Worry: Not on file    Inability: Not on file  . Transportation needs:    Medical: Not on file    Non-medical: Not on file  Tobacco Use  . Smoking status: Never Smoker  . Smokeless tobacco: Never Used  Substance and Sexual Activity  . Alcohol use: Yes    Alcohol/week: 3.0 standard drinks    Types: 1 Glasses of wine, 1 Cans of beer, 1 Shots of liquor per week    Comment: rarely  . Drug use: No  . Sexual activity: Yes  Lifestyle  . Physical activity:    Days per week: Not on file    Minutes per session: Not on file  . Stress: Not on file  Relationships  . Social connections:    Talks on phone: Not on file    Gets together: Not on file    Attends religious service: Not on file    Active member of club or organization: Not on file    Attends meetings of clubs or organizations: Not on file    Relationship status: Not on file  Other Topics Concern  . Not on file  Social History Narrative   Married 1974   1 son, Engineer, agricultural ([place] [employer]: A 12 point ROS discussed and pertinent positives are indicated in the HPI above.  All other systems are negative.  Review of Systems  Constitutional: Negative for chills and fever.  HENT: Negative for hearing loss and tinnitus.   Eyes: Negative for visual disturbance.  Respiratory: Negative for shortness of  breath and wheezing.   Cardiovascular: Negative for chest pain and palpitations.  Neurological: Positive for weakness and numbness. Negative for dizziness, speech difficulty and headaches.  Psychiatric/Behavioral: Negative for behavioral problems and confusion.    Vital Signs: There were no vitals taken for this visit.  Physical Exam  Constitutional: He is oriented to person, place, and time. He appears well-developed and well-nourished. No distress.  Pulmonary/Chest: Effort normal. No respiratory distress.  Neurological: He is alert and oriented to person, place, and time.  Skin: Skin is warm and dry.  Psychiatric: He has a normal mood and affect. His behavior is normal. Judgment and thought content normal.  Nursing note and vitals reviewed.    Imaging: No results found.  Labs:  CBC: No results for input(s): WBC, HGB, HCT, PLT in the last 8760 hours.  COAGS: No  results for input(s): INR, APTT in the last 8760 hours.  BMP: Recent Labs    10/13/17 0742  NA 139  K 4.2  CL 106  CO2 24  GLUCOSE 126*  BUN 16  CALCIUM 9.2  CREATININE 0.79    LIVER FUNCTION TESTS: Recent Labs    10/13/17 0742  BILITOT 0.9  AST 19  ALT 27  ALKPHOS 65  PROT 6.8  ALBUMIN 4.4    TUMOR MARKERS: No results for input(s): AFPTM, CEA, CA199, CHROMGRNA in the last 8760 hours.  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 Reviewed imaging with patient and wife. Brought to their attention was patient's right vertebrobasilar junction stenosis. Informed patient and wife that this is stable compared to prior scans. Also brought to their attention was patient's left ICA cavernous segment aneurysm. Informed patient and wife that this is also stable compared to prior scans. Informed patient and wife that since patient is grossly asymptomatic and his imaging scans have been stable, the best course of management at this time is monitoring  with routine imaging scans.  Plan for follow-up with MRA head (without contrast) ASAP (it has been 6 months since his previous scan). Informed patient that our schedulers will call him to set up this imaging scan. Instructed patient to continue taking Plavix 75 mg once daily.  All questions answered and concerns addressed. Patient and wife convey understanding and agree with plan.  Thank you for this interesting consult.  I greatly enjoyed meeting MATE ALEGRIA and look forward to participating in their care.  A copy of this report was sent to the requesting provider on this date.  Electronically Signed: Earley Abide, PA-C 12/12/2017, 11:31 AM   I spent a total of 25 Minutes in face to face in clinical consultation, greater than 50% of which was counseling/coordinating care for right vertebrobasilar junction stenosis AND left ICA cavernous segment aneurysm.

## 2017-12-19 ENCOUNTER — Telehealth (HOSPITAL_COMMUNITY): Payer: Self-pay

## 2017-12-19 NOTE — Telephone Encounter (Signed)
Called to schedule mri, no answer, left vm. AW 

## 2017-12-20 ENCOUNTER — Other Ambulatory Visit (HOSPITAL_COMMUNITY): Payer: Self-pay | Admitting: Interventional Radiology

## 2017-12-20 DIAGNOSIS — I729 Aneurysm of unspecified site: Secondary | ICD-10-CM

## 2017-12-20 DIAGNOSIS — I771 Stricture of artery: Secondary | ICD-10-CM

## 2017-12-23 ENCOUNTER — Ambulatory Visit (HOSPITAL_COMMUNITY)
Admission: RE | Admit: 2017-12-23 | Discharge: 2017-12-23 | Disposition: A | Discharge status: Home / Self Care | Payer: Medicare Other | Source: Ambulatory Visit | Attending: Interventional Radiology | Admitting: Interventional Radiology

## 2017-12-23 DIAGNOSIS — I729 Aneurysm of unspecified site: Secondary | ICD-10-CM | POA: Insufficient documentation

## 2017-12-23 DIAGNOSIS — I771 Stricture of artery: Secondary | ICD-10-CM | POA: Insufficient documentation

## 2017-12-23 DIAGNOSIS — I672 Cerebral atherosclerosis: Secondary | ICD-10-CM | POA: Insufficient documentation

## 2017-12-23 DIAGNOSIS — I6502 Occlusion and stenosis of left vertebral artery: Secondary | ICD-10-CM | POA: Insufficient documentation

## 2017-12-23 DIAGNOSIS — I6509 Occlusion and stenosis of unspecified vertebral artery: Secondary | ICD-10-CM | POA: Diagnosis not present

## 2017-12-27 ENCOUNTER — Telehealth (HOSPITAL_COMMUNITY): Payer: Self-pay

## 2017-12-27 NOTE — Telephone Encounter (Signed)
Pt agreed to f/u in 6 months with mra wo. AW 

## 2018-02-13 DIAGNOSIS — D1801 Hemangioma of skin and subcutaneous tissue: Secondary | ICD-10-CM | POA: Diagnosis not present

## 2018-02-13 DIAGNOSIS — D229 Melanocytic nevi, unspecified: Secondary | ICD-10-CM | POA: Diagnosis not present

## 2018-02-13 DIAGNOSIS — Z85828 Personal history of other malignant neoplasm of skin: Secondary | ICD-10-CM | POA: Diagnosis not present

## 2018-02-13 DIAGNOSIS — L814 Other melanin hyperpigmentation: Secondary | ICD-10-CM | POA: Diagnosis not present

## 2018-02-13 DIAGNOSIS — L821 Other seborrheic keratosis: Secondary | ICD-10-CM | POA: Diagnosis not present

## 2018-02-13 DIAGNOSIS — L819 Disorder of pigmentation, unspecified: Secondary | ICD-10-CM | POA: Diagnosis not present

## 2018-05-08 ENCOUNTER — Ambulatory Visit (INDEPENDENT_AMBULATORY_CARE_PROVIDER_SITE_OTHER): Payer: Medicare Other | Admitting: Family Medicine

## 2018-05-08 ENCOUNTER — Encounter: Payer: Self-pay | Admitting: Family Medicine

## 2018-05-08 DIAGNOSIS — R21 Rash and other nonspecific skin eruption: Secondary | ICD-10-CM | POA: Diagnosis not present

## 2018-05-08 MED ORDER — CLOTRIMAZOLE-BETAMETHASONE 1-0.05 % EX CREA: 1.0000 "application " | TOPICAL_CREAM | Freq: Two times a day (BID) | CUTANEOUS | 1 refills | Status: DC | PRN

## 2018-05-08 NOTE — Patient Instructions (Signed)
Use lotrisone and update me as needed.  Take care.  Glad to see you.

## 2018-05-08 NOTE — Progress Notes (Signed)
He has been able to tolerate 10mg  BID of crestor.  He will continue that as is for now.  Rash on L lower shin, itches, present for a few weeks.  Gradually worse.  Used neosporin. No sx anywhere else.  History of similar years ago.  Previously used Lotrisone.  Has not had that to use recently.  Meds, vitals, and allergies reviewed.   ROS: Per HPI unless specifically indicated in ROS section   nad 2 lesions on the left lower shin.  One is 5 x 2 cm.  The other is 3 x 2 cm.  Local irritation without vesicles or ulceration.  No fluctuant mass.  Blanching.  Has some skin flaking locally.

## 2018-05-10 NOTE — Assessment & Plan Note (Signed)
Similar to previous, looks irritated but not typical for a bacterial cellulitis.  Could have a fungal component.  Reasonable to use Lotrisone and then update me as needed.  He agrees.  Routine cautions given.

## 2018-09-19 ENCOUNTER — Ambulatory Visit: Payer: Medicare Other

## 2018-09-19 ENCOUNTER — Telehealth: Payer: Self-pay

## 2018-09-19 DIAGNOSIS — Z0000 Encounter for general adult medical examination without abnormal findings: Secondary | ICD-10-CM

## 2018-09-19 NOTE — Telephone Encounter (Signed)
Left message to call clinic, needs COVID screen and back door lab info   

## 2018-09-20 ENCOUNTER — Other Ambulatory Visit (INDEPENDENT_AMBULATORY_CARE_PROVIDER_SITE_OTHER): Payer: Medicare Other

## 2018-09-20 ENCOUNTER — Other Ambulatory Visit: Payer: Self-pay

## 2018-09-20 ENCOUNTER — Other Ambulatory Visit: Payer: Self-pay | Admitting: Family Medicine

## 2018-09-20 DIAGNOSIS — E785 Hyperlipidemia, unspecified: Secondary | ICD-10-CM

## 2018-09-20 DIAGNOSIS — Z125 Encounter for screening for malignant neoplasm of prostate: Secondary | ICD-10-CM

## 2018-09-20 DIAGNOSIS — R739 Hyperglycemia, unspecified: Secondary | ICD-10-CM | POA: Diagnosis not present

## 2018-09-20 LAB — LIPID PANEL
Cholesterol: 123 mg/dL (ref 0–200)
HDL: 41.8 mg/dL (ref >39.00)
LDL Cholesterol: 65 mg/dL (ref 0–99)
NonHDL: 81.08
Total CHOL/HDL Ratio: 3
Triglycerides: 81 mg/dL (ref 0.0–149.0)
VLDL: 16.2 mg/dL (ref 0.0–40.0)

## 2018-09-20 LAB — HEMOGLOBIN A1C: Hgb A1c MFr Bld: 5.8 % (ref 4.6–6.5)

## 2018-09-20 LAB — COMPREHENSIVE METABOLIC PANEL
ALT: 26 U/L (ref 0–53)
AST: 19 U/L (ref 0–37)
Albumin: 4.3 g/dL (ref 3.5–5.2)
Alkaline Phosphatase: 69 U/L (ref 39–117)
BUN: 13 mg/dL (ref 6–23)
CO2: 28 mEq/L (ref 19–32)
Calcium: 9.1 mg/dL (ref 8.4–10.5)
Chloride: 105 mEq/L (ref 96–112)
Creatinine, Ser: 0.86 mg/dL (ref 0.40–1.50)
GFR: 88.51 mL/min (ref >60.00)
Glucose, Bld: 115 mg/dL — ABNORMAL HIGH (ref 70–99)
Potassium: 4.3 mEq/L (ref 3.5–5.1)
Sodium: 140 mEq/L (ref 135–145)
Total Bilirubin: 0.7 mg/dL (ref 0.2–1.2)
Total Protein: 6.4 g/dL (ref 6.0–8.3)

## 2018-09-20 LAB — PSA, MEDICARE: PSA: 2.5 ng/ml (ref 0.10–4.00)

## 2018-09-20 NOTE — Patient Instructions (Signed)
Alejandro Schneider , Thank you for taking time to come for your Medicare Wellness Visit. I appreciate your ongoing commitment to your health goals. Please review the following plan we discussed and let me know if I can assist you in the future.   These are the goals we discussed: Goals    . Patient Stated     Starting 09/20/18, I will continue to take medications as prescribed.        This is a list of the screening recommended for you and due dates:  Health Maintenance  Topic Date Due  . Flu Shot  11/04/2018  . Colon Cancer Screening  12/14/2020  . Tetanus Vaccine  09/06/2022  .  Hepatitis C: One time screening is recommended by Center for Disease Control  (CDC) for  adults born from [birth year] through 1965.   Completed  . Pneumonia vaccines  Completed   Preventive Care for Adults  A healthy lifestyle and preventive care can promote health and wellness. Preventive health guidelines for adults include the following key practices.  . A routine yearly physical is a good way to check with your health care provider about your health and preventive screening. It is a chance to share any concerns and updates on your health and to receive a thorough exam.  . Visit your dentist for a routine exam and preventive care every 6 months. Brush your teeth twice a day and floss once a day. Good oral hygiene prevents tooth decay and gum disease.  . The frequency of eye exams is based on your age, health, family medical history, use  of contact lenses, and other factors. Follow your health care provider's recommendations for frequency of eye exams.  . Eat a healthy diet. Foods like vegetables, fruits, whole grains, low-fat dairy products, and lean protein foods contain the nutrients you need without too many calories. Decrease your intake of foods high in solid fats, added sugars, and salt. Eat the right amount of calories for you. Get information about a proper diet from your health care provider, if necessary.  .  Regular physical exercise is one of the most important things you can do for your health. Most adults should get at least 150 minutes of moderate-intensity exercise (any activity that increases your heart rate and causes you to sweat) each week. In addition, most adults need muscle-strengthening exercises on 2 or more days a week.  Silver Sneakers may be a benefit available to you. To determine eligibility, you may visit the website: www.silversneakers.com or contact program at 401-585-5543 Mon-Fri between 8AM-8PM.   . Maintain a healthy weight. The body mass index (BMI) is a screening tool to identify possible weight problems. It provides an estimate of body fat based on height and weight. Your health care provider can find your BMI and can help you achieve or maintain a healthy weight.   For adults 20 years and older: ? A BMI below 18.5 is considered underweight. ? A BMI of 18.5 to 24.9 is normal. ? A BMI of 25 to 29.9 is considered overweight. ? A BMI of 30 and above is considered obese.   . Maintain normal blood lipids and cholesterol levels by exercising and minimizing your intake of saturated fat. Eat a balanced diet with plenty of fruit and vegetables. Blood tests for lipids and cholesterol should begin at age 45 and be repeated every 5 years. If your lipid or cholesterol levels are high, you are over 50, or you are at high risk for  heart disease, you may need your cholesterol levels checked more frequently. Ongoing high lipid and cholesterol levels should be treated with medicines if diet and exercise are not working.  . If you smoke, find out from your health care provider how to quit. If you do not use tobacco, please do not start.  . If you choose to drink alcohol, please do not consume more than 2 drinks per day. One drink is considered to be 12 ounces (355 mL) of beer, 5 ounces (148 mL) of wine, or 1.5 ounces (44 mL) of liquor.  . If you are 35-66 years old, ask your health care  provider if you should take aspirin to prevent strokes.  . Use sunscreen. Apply sunscreen liberally and repeatedly throughout the day. You should seek shade when your shadow is shorter than you. Protect yourself by wearing long sleeves, pants, a wide-brimmed hat, and sunglasses year round, whenever you are outdoors.  . Once a month, do a whole body skin exam, using a mirror to look at the skin on your back. Tell your health care provider of new moles, moles that have irregular borders, moles that are larger than a pencil eraser, or moles that have changed in shape or color.

## 2018-09-20 NOTE — Progress Notes (Signed)
Subjective:   Alejandro Schneider is a 68 y.o. male who presents for Medicare Annual/Subsequent preventive examination.  Review of Systems:  N/A Cardiac Risk Factors include: advanced age (>74men, >58 women);male gender     Objective:    Vitals: There were no vitals taken for this visit.  There is no height or weight on file to calculate BMI.  Advanced Directives 09/19/2018 09/19/2015 12/17/2014 10/28/2014 07/22/2014 05/23/2014 05/20/2014  Does Patient Have a Medical Advance Directive? No No No No No No No  Would patient like information on creating a medical advance directive? No - Patient declined No - patient declined information Yes Higher education careers adviser given No - patient declined information Yes Higher education careers adviser given Yes - Scientist, clinical (histocompatibility and immunogenetics) given No - patient declined information    Tobacco Social History   Tobacco Use  Smoking Status Never Smoker  Smokeless Tobacco Never Used     Counseling given: No   Clinical Intake:  Pre-visit preparation completed: Yes  Pain : No/denies pain Pain Score: 0-No pain     Nutritional Status: BMI > 30  Obese Nutritional Risks: None Diabetes: No  How often do you need to have someone help you when you read instructions, pamphlets, or other written materials from your doctor or pharmacy?: 1 - Never What is the last grade level you completed in school?: 12TH GRADE + 2 [place] Needed?: No  Comments: pt lives with spouse Information entered by :: LPinson, RN  Past Medical History:  Diagnosis Date  . CVA (cerebral infarction)   . Hyperlipidemia   . Hypertension   . Internal hemorrhoids   . Stroke [facility])    Past Surgical History:  Procedure Laterality Date  . COLONOSCOPY  2006  . INGUINAL HERNIA REPAIR  10/16/02   Right  Dr Hassell Done  . LUMBAR LAMINECTOMY/DECOMPRESSION MICRODISCECTOMY Right 05/30/2012   Procedure: LUMBAR LAMINECTOMY/DECOMPRESSION MICRODISCECTOMY 1 LEVEL;  Surgeon: Faythe Ghee, MD;   Location: [facility];  Service: Neurosurgery;  Laterality: Right;  . RADIOLOGY WITH ANESTHESIA N/A 05/21/2014   Procedure: ANGIOPLASTY POSSIBLE STENT;  Surgeon: Rob Hickman, MD;  Location: [place];  Service: Radiology;  Laterality: N/A;  . TONSILLECTOMY  1972   Family History  Problem Relation Age of Onset  . Heart disease Father        CABGx4  . Cancer Father        Prostate (Radiation)  . Prostate cancer Father   . Heart disease Paternal Grandfather        MI  . Depression Mother   . Stroke Maternal Grandmother   . Prostate cancer Brother   . Diabetes Cousin   . Hypertension Neg Hx   . Drug abuse Neg Hx   . Alcohol abuse Neg Hx   . Colon cancer Neg Hx   . Esophageal cancer Neg Hx   . Pancreatic cancer Neg Hx   . Rectal cancer Neg Hx   . Stomach cancer Neg Hx    Social History   Socioeconomic History  . Marital status: Married    Spouse name: Not on file  . Number of children: 1  . Years of education: Not on file  . Highest education level: Not on file  Occupational History  . Occupation: Counselling psychologist  Social Needs  . Financial resource strain: Not on file  . Food insecurity    Worry: Not on file    Inability: Not on file  . Transportation needs  Medical: Not on file    Non-medical: Not on file  Tobacco Use  . Smoking status: Never Smoker  . Smokeless tobacco: Never Used  Substance and Sexual Activity  . Alcohol use: Yes    Alcohol/week: 2.0 standard drinks    Types: 2 Cans of beer per week  . Drug use: No  . Sexual activity: Yes  Lifestyle  . Physical activity    Days per week: Not on file    Minutes per session: Not on file  . Stress: Not on file  Relationships  . Social Herbalist on phone: Not on file    Gets together: Not on file    Attends religious service: Not on file    Active member of club or organization: Not on file    Attends meetings of clubs or organizations: Not on file    Relationship status: Not on file   Other Topics Concern  . 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 amLODipine (NORVASC) 5 MG tablet Take 1 tablet (5 mg total) by mouth daily.  . clopidogrel (PLAVIX) 75 MG tablet TAKE 1 TABLET (75 MG TOTAL) BY MOUTH DAILY.  . clotrimazole-betamethasone (LOTRISONE) cream Apply 1 application topically 2 (two) times daily as needed.  . Multiple Vitamins-Minerals (MULTIVITAMIN,TX-MINERALS) tablet Take 1 tablet by mouth daily.    . Omega-3 Fatty Acids (FISH OIL) 1000 MG CAPS Take 1,000 mg by mouth daily.  . rosuvastatin (CRESTOR) 10 MG tablet Take 2 tablets (20 mg total) by mouth daily.   Facility-Administered Encounter Medications as of 09/19/2018  Medication  . 0.9 %  sodium chloride infusion    Activities of Daily Living In your present state of health, do you have any difficulty performing the following activities: 09/19/2018  Hearing? N  Vision? N  Difficulty concentrating or making decisions? N  Walking or climbing stairs? N  Dressing or bathing? N  Doing errands, shopping? N  Preparing Food and eating ? N  Using the Toilet? N  In the past six months, have you accidently leaked urine? N  Do you have problems with loss of bowel control? N  Managing your Medications? N  Managing your Finances? N  Housekeeping or managing your Housekeeping? N  Some recent data might be hidden    Patient Care Team: Tonia Ghent, MD as PCP - General (Family Medicine)   Assessment:   This is a routine wellness examination for Alejandro Schneider.   Hearing Screening   125Hz  250Hz  500Hz  1000Hz  2000Hz  3000Hz  4000Hz  6000Hz  8000Hz   Right ear:           Left ear:           Vision Screening Comments: Last vision exam in June 2019 @ [place] ' Exercise Activities and Dietary recommendations Current Exercise Habits: Home exercise  routine, Type of exercise: treadmill;strength training/weights, Time (Minutes): 45, Frequency (Times/Week): 3, Weekly Exercise (Minutes/Week): 135, Intensity: Moderate, Exercise limited by: None identified  Goals    . Patient Stated     Starting 09/20/18, I will continue to take medications as prescribed.        Fall Risk Fall Risk  09/19/2018 10/19/2017 11/24/2015 05/26/2015 12/17/2014  Falls in the past year? 0 No No No No    Depression  Screen PHQ 2/9 Scores 09/19/2018 10/19/2017 10/01/2016 12/17/2014  PHQ - 2 Score 0 0 0 0  PHQ- 9 Score 0 - - -    Cognitive Function MMSE - Mini Mental State Exam 09/19/2018  Orientation to time 5  Orientation to Place 5  Registration 3  Attention/ Calculation 0  Recall 3  Language- name 2 objects 0  Language- repeat 1  Language- follow 3 step command 0  Language- read & follow direction 0  Write a sentence 0  Copy design 0  Total score 17     PLEASE NOTE: A Mini-Cog screen was completed. Maximum score is 17. A value of 0 denotes this part of [place] MMSE was not completed or the patient failed this part of the Mini-Cog screening.   Mini-Cog Screening Orientation to Time - Max 5 pts Orientation to Place - Max 5 pts Registration - Max 3 pts Recall - Max 3 pts Language Repeat - Max 1 pts      Immunization History  Administered Date(s) Administered  . Influenza, High Dose Seasonal PF 02/01/2017  . Influenza-Unspecified 02/01/2014  . Pneumococcal Conjugate-13 10/01/2016  . Pneumococcal Polysaccharide-23 10/19/2017  . Td 04/05/2002  . Tdap 09/05/2012  . Zoster 09/06/2013    Screening Tests Health Maintenance  Topic Date Due  . INFLUENZA VACCINE  11/04/2018  . COLONOSCOPY  12/14/2020  . TETANUS/TDAP  09/06/2022  . Hepatitis C Screening  Completed  . PNA vac Low Risk Adult  Completed        Plan:     I have personally reviewed, addressed, and noted the following in the patient's chart:  A. Medical and social history B. Use of  alcohol, tobacco or illicit drugs  C. Current medications and supplements D. Functional ability and status E.  Nutritional status F.  Physical activity G. Advance directives H. List of other physicians I.  Hospitalizations, surgeries, and ER visits in previous 12 months J.  Vitals (unless it is a telemedicine encounter) K. Screenings to include cognitive, depression, hearing, vision (NOTE: hearing and vision screenings not completed in telemedicine encounter) L. Referrals and appointments   In addition, I have reviewed and discussed with patient certain preventive protocols, quality metrics, and best practice recommendations. A written personalized care plan for preventive services and recommendations were provided to patient.  With patient's permission, we connected on 09/20/18 at  1:00 PM EDT. Interactive audio and video telecommunications were attempted with patient. This attempt was unsuccessful due to patient having technical difficulties OR patient did not have access to video capability.  Encounter was completed with audio only.  Two patient identifiers were used to ensure the encounter occurred with the correct person. Patient was in home and writer was in office.   Signed,   Lindell Noe, MHA, BS, RN Health Coach

## 2018-09-20 NOTE — Progress Notes (Signed)
PCP notes:  Health maintenance:  No gaps identified  Abnormal screenings:   None  Patient concerns:   None  Nurse concerns:  None  Next PCP appt:   09/26/18 @ 1200  I reviewed health advisor's note, was available for consultation on the day of service listed in this note, and agree with documentation and plan. Elsie Stain, MD.

## 2018-09-26 ENCOUNTER — Other Ambulatory Visit: Payer: Self-pay

## 2018-09-26 ENCOUNTER — Encounter: Payer: Self-pay | Admitting: Family Medicine

## 2018-09-26 ENCOUNTER — Ambulatory Visit (INDEPENDENT_AMBULATORY_CARE_PROVIDER_SITE_OTHER): Payer: Medicare Other | Admitting: Family Medicine

## 2018-09-26 VITALS — BP 138/78 | HR 71 | Temp 98.4°F | Ht 69.0 in | Wt 209.1 lb

## 2018-09-26 DIAGNOSIS — E785 Hyperlipidemia, unspecified: Secondary | ICD-10-CM | POA: Diagnosis not present

## 2018-09-26 DIAGNOSIS — M25519 Pain in unspecified shoulder: Secondary | ICD-10-CM | POA: Diagnosis not present

## 2018-09-26 DIAGNOSIS — Z0000 Encounter for general adult medical examination without abnormal findings: Secondary | ICD-10-CM

## 2018-09-26 DIAGNOSIS — L821 Other seborrheic keratosis: Secondary | ICD-10-CM

## 2018-09-26 DIAGNOSIS — Z7189 Other specified counseling: Secondary | ICD-10-CM | POA: Diagnosis not present

## 2018-09-26 DIAGNOSIS — I639 Cerebral infarction, unspecified: Secondary | ICD-10-CM

## 2018-09-26 DIAGNOSIS — I10 Essential (primary) hypertension: Secondary | ICD-10-CM | POA: Diagnosis not present

## 2018-09-26 MED ORDER — ROSUVASTATIN CALCIUM 10 MG PO TABS: 10.0000 mg | ORAL_TABLET | Freq: Every day | ORAL | Status: DC

## 2018-09-26 MED ORDER — CLOPIDOGREL BISULFATE 75 MG PO TABS: ORAL_TABLET | ORAL | 3 refills | Status: DC

## 2018-09-26 MED ORDER — AMLODIPINE BESYLATE 5 MG PO TABS: 5.0000 mg | ORAL_TABLET | Freq: Every day | ORAL | 3 refills | Status: DC

## 2018-09-26 MED ORDER — ROSUVASTATIN CALCIUM 10 MG PO TABS: 10.0000 mg | ORAL_TABLET | Freq: Every day | ORAL | 3 refills | Status: DC

## 2018-09-26 NOTE — Patient Instructions (Addendum)
Check with your insurance to see if they will cover the shingrix shot. Don't change your meds for now.  I'll check with Dr. Estanislado Pandy.  Update me as needed.  Take care.  Glad to see you.

## 2018-09-26 NOTE — Progress Notes (Signed)
Hypertension:    Using medication without problems or lightheadedness: yes Chest pain with exertion: no Edema:no Short of breath:no Labs d/w pt.   Elevated Cholesterol: Using medications without problems: yes, taking crestor 10mg  a day.   Muscle aches: no Diet compliance: yes Exercise: yes  Prev CVA. He had put off f/u scans with radiology.  I'll check with radiology about Dr Estanislado Pandy about f/u.  No new numbness, weakness.  No speech changes.    He has some old R shoulder pain that was presumed from prev softball injury in the distant past and he is putting up with that.    Tetanus 2014 zostavax 2015- shingrix out of stock Flu prev done PNA 2019 Colonosocpy 2017.  PSA wnl. D/w pt. FH noted.  Living will d/w pt- wife designated if patient were incapacitated.  Diet and exercise d/w pt. Exercise as tolerated, diet is good.  Prev rash is better.    Meds, vitals, and allergies reviewed.   PMH and SH reviewed  ROS: Per HPI unless specifically indicated in ROS section   GEN: nad, alert and oriented HEENT: mucous membranes moist NECK: supple w/o LA CV: rrr. PULM: ctab, no inc wob ABD: soft, +bs EXT: no edema SKIN: no acute rash but 1.0 x 0.8cm SK on the midback Right shoulder with no arm drop and normal internal and external rotation but he does have some pain with supraspinatus testing on the right.

## 2018-09-27 DIAGNOSIS — Z0000 Encounter for general adult medical examination without abnormal findings: Secondary | ICD-10-CM | POA: Insufficient documentation

## 2018-09-27 NOTE — Assessment & Plan Note (Signed)
Living will d/w pt- wife designated if patient were incapacitated.   

## 2018-09-27 NOTE — Assessment & Plan Note (Signed)
No change in meds.  Continue as is.  He agrees.  Labs discussed with patient.

## 2018-09-27 NOTE — Assessment & Plan Note (Signed)
No significant change.  Reassuring exam.

## 2018-09-27 NOTE — Assessment & Plan Note (Signed)
He will update me if he wants to go see orthopedics.  He is putting up with it at this point.

## 2018-09-27 NOTE — Assessment & Plan Note (Signed)
No new symptoms.  I sent a note to Dr. Estanislado Pandy asking about when it would be possible to get follow-up imaging done.  No change in medications at this point.  He agrees.  Routine cautions given.  Discussed secondary prevention.

## 2018-09-27 NOTE — Assessment & Plan Note (Signed)
  Tetanus 2014 zostavax 2015- shingrix out of stock Flu prev done PNA 2019 Colonosocpy 2017.  PSA wnl. D/w pt. FH noted.  Living will d/w pt- wife designated if patient were incapacitated.  Diet and exercise d/w pt. Exercise as tolerated, diet is good.

## 2018-09-27 NOTE — Assessment & Plan Note (Deleted)
  Tetanus 2014 zostavax 2015- shingrix out of stock Flu prev done PNA 2019 Colonosocpy 2017.  PSA wnl. D/w pt. FH noted.  Living will d/w pt- wife designated if patient were incapacitated.  Diet and exercise d/w pt. Exercise as tolerated, diet is good.

## 2018-09-28 ENCOUNTER — Other Ambulatory Visit (HOSPITAL_COMMUNITY): Payer: Self-pay | Admitting: Interventional Radiology

## 2018-09-28 DIAGNOSIS — I771 Stricture of artery: Secondary | ICD-10-CM

## 2018-09-28 DIAGNOSIS — I729 Aneurysm of unspecified site: Secondary | ICD-10-CM

## 2018-10-16 ENCOUNTER — Ambulatory Visit (HOSPITAL_COMMUNITY)
Admission: RE | Admit: 2018-10-16 | Discharge: 2018-10-16 | Disposition: A | Discharge status: Home / Self Care | Payer: Medicare Other | Source: Ambulatory Visit | Attending: Interventional Radiology | Admitting: Interventional Radiology

## 2018-10-16 ENCOUNTER — Other Ambulatory Visit: Payer: Self-pay

## 2018-10-16 DIAGNOSIS — I729 Aneurysm of unspecified site: Secondary | ICD-10-CM | POA: Diagnosis present

## 2018-10-16 DIAGNOSIS — I671 Cerebral aneurysm, nonruptured: Secondary | ICD-10-CM | POA: Diagnosis not present

## 2018-10-16 DIAGNOSIS — I771 Stricture of artery: Secondary | ICD-10-CM

## 2018-10-18 ENCOUNTER — Telehealth (HOSPITAL_COMMUNITY): Payer: Self-pay

## 2018-10-18 NOTE — Telephone Encounter (Signed)
Pt agreed to f/u in 6 months with mri/mra. AW  

## 2019-01-19 DIAGNOSIS — Z23 Encounter for immunization: Secondary | ICD-10-CM | POA: Diagnosis not present

## 2019-03-05 DIAGNOSIS — L814 Other melanin hyperpigmentation: Secondary | ICD-10-CM | POA: Diagnosis not present

## 2019-03-05 DIAGNOSIS — Z85828 Personal history of other malignant neoplasm of skin: Secondary | ICD-10-CM | POA: Diagnosis not present

## 2019-03-05 DIAGNOSIS — C44319 Basal cell carcinoma of skin of other parts of face: Secondary | ICD-10-CM | POA: Diagnosis not present

## 2019-03-05 DIAGNOSIS — L819 Disorder of pigmentation, unspecified: Secondary | ICD-10-CM | POA: Diagnosis not present

## 2019-03-05 DIAGNOSIS — L821 Other seborrheic keratosis: Secondary | ICD-10-CM | POA: Diagnosis not present

## 2019-03-05 DIAGNOSIS — D229 Melanocytic nevi, unspecified: Secondary | ICD-10-CM | POA: Diagnosis not present

## 2019-03-05 DIAGNOSIS — D1801 Hemangioma of skin and subcutaneous tissue: Secondary | ICD-10-CM | POA: Diagnosis not present

## 2019-03-06 DIAGNOSIS — C441122 Basal cell carcinoma of skin of right lower eyelid, including canthus: Secondary | ICD-10-CM | POA: Diagnosis not present

## 2019-03-12 DIAGNOSIS — C44319 Basal cell carcinoma of skin of other parts of face: Secondary | ICD-10-CM | POA: Diagnosis not present

## 2019-03-13 DIAGNOSIS — C44319 Basal cell carcinoma of skin of other parts of face: Secondary | ICD-10-CM | POA: Diagnosis not present

## 2019-04-10 ENCOUNTER — Telehealth: Payer: Self-pay

## 2019-04-10 NOTE — Telephone Encounter (Signed)
Patient contacted the office and stated he was at [employer] with a friend, as they are administering COVID vaccines today. He states that he is waiting to receive the vaccine and that the nurse advised he get a verbal ok from Dr. Damita Dunnings to receive this because he is on blood thinners. I did receive a verbal ok from Dr. Damita Dunnings that patient is ok to receive the vaccine.  Thank you!!

## 2019-04-11 NOTE — Telephone Encounter (Signed)
Late entry.  Chart previously reviewed.  I am not aware of any contraindication to vaccination for this patient.  Thanks.

## 2019-04-23 ENCOUNTER — Other Ambulatory Visit (HOSPITAL_COMMUNITY): Payer: Self-pay | Admitting: Interventional Radiology

## 2019-04-23 DIAGNOSIS — I671 Cerebral aneurysm, nonruptured: Secondary | ICD-10-CM

## 2019-04-26 ENCOUNTER — Telehealth (HOSPITAL_COMMUNITY): Payer: Self-pay

## 2019-04-26 NOTE — Telephone Encounter (Signed)
Pt called to confirm appt info. AW

## 2019-05-02 ENCOUNTER — Other Ambulatory Visit: Payer: Self-pay

## 2019-05-02 ENCOUNTER — Ambulatory Visit (HOSPITAL_COMMUNITY): Admission: RE | Admit: 2019-05-02 | Payer: Medicare Other | Source: Ambulatory Visit

## 2019-05-02 ENCOUNTER — Ambulatory Visit (HOSPITAL_COMMUNITY)
Admission: RE | Admit: 2019-05-02 | Discharge: 2019-05-02 | Disposition: A | Discharge status: Home / Self Care | Payer: Medicare Other | Source: Ambulatory Visit | Attending: Interventional Radiology | Admitting: Interventional Radiology

## 2019-05-02 DIAGNOSIS — I671 Cerebral aneurysm, nonruptured: Secondary | ICD-10-CM | POA: Diagnosis not present

## 2019-05-02 DIAGNOSIS — I720 Aneurysm of carotid artery: Secondary | ICD-10-CM | POA: Diagnosis not present

## 2019-05-07 ENCOUNTER — Telehealth (HOSPITAL_COMMUNITY): Payer: Self-pay

## 2019-05-07 DIAGNOSIS — C44319 Basal cell carcinoma of skin of other parts of face: Secondary | ICD-10-CM | POA: Diagnosis not present

## 2019-05-07 NOTE — Telephone Encounter (Signed)
Called pt regarding recent mri, no answer, left vm. AW 

## 2019-05-07 NOTE — Telephone Encounter (Signed)
Pt agreed to f/u in 6 months with mra. AW  

## 2019-05-25 ENCOUNTER — Ambulatory Visit: Payer: Medicare Other | Admitting: Family Medicine

## 2019-07-17 ENCOUNTER — Other Ambulatory Visit (HOSPITAL_COMMUNITY): Payer: Self-pay | Admitting: Interventional Radiology

## 2019-07-17 ENCOUNTER — Telehealth: Payer: Self-pay

## 2019-07-17 DIAGNOSIS — R200 Anesthesia of skin: Secondary | ICD-10-CM

## 2019-07-17 DIAGNOSIS — I671 Cerebral aneurysm, nonruptured: Secondary | ICD-10-CM

## 2019-07-17 DIAGNOSIS — R202 Paresthesia of skin: Secondary | ICD-10-CM

## 2019-07-17 DIAGNOSIS — R42 Dizziness and giddiness: Secondary | ICD-10-CM

## 2019-07-17 DIAGNOSIS — R531 Weakness: Secondary | ICD-10-CM

## 2019-07-17 NOTE — Telephone Encounter (Signed)
Left message on patient's voicemail to return call with update. 

## 2019-07-17 NOTE — Telephone Encounter (Signed)
Pt having tingling in rt arm and leg; having difficulty walking due to rt knee weakness . 5 yrs ago had CVA and these symptoms have been going on for a while but today has worsened. Pt has lightheadedness on and off. For 1 month pt has H/A for short period every day and then that goes away.pt BP 147/88. Pt does not have CP but does have SOB upon exertion. Pt advised to go to ED for eval. Pt said he just had MRI of brain in 05/2019 and Dr Estanislado Pandy told pt not any worse. Pt will call Dr Estanislado Pandy to see what he would suggest. UC & ED precautions given and pt voiced understanding. I advised pt symptoms could be different things but with pts hx of stroke one of main concerns would be pending stroke. Pt voiced understanding but will call to see what Dr Arlean Hopping suggestions are.FYI to Dr Damita Dunnings.

## 2019-07-17 NOTE — Telephone Encounter (Signed)
Agree with ER dispo. Thanks.

## 2019-07-17 NOTE — Telephone Encounter (Signed)
Please get update on patient.  Thanks. 

## 2019-07-18 ENCOUNTER — Other Ambulatory Visit: Payer: Self-pay

## 2019-07-18 ENCOUNTER — Ambulatory Visit (HOSPITAL_COMMUNITY)
Admission: RE | Admit: 2019-07-18 | Discharge: 2019-07-18 | Disposition: A | Discharge status: Home / Self Care | Payer: Medicare Other | Source: Ambulatory Visit | Attending: Interventional Radiology | Admitting: Interventional Radiology

## 2019-07-18 DIAGNOSIS — R202 Paresthesia of skin: Secondary | ICD-10-CM | POA: Insufficient documentation

## 2019-07-18 DIAGNOSIS — R42 Dizziness and giddiness: Secondary | ICD-10-CM | POA: Insufficient documentation

## 2019-07-18 DIAGNOSIS — R200 Anesthesia of skin: Secondary | ICD-10-CM | POA: Insufficient documentation

## 2019-07-18 DIAGNOSIS — R531 Weakness: Secondary | ICD-10-CM | POA: Diagnosis not present

## 2019-07-18 DIAGNOSIS — I671 Cerebral aneurysm, nonruptured: Secondary | ICD-10-CM | POA: Insufficient documentation

## 2019-07-18 NOTE — Telephone Encounter (Signed)
Pt returned call, states that he had an MRI done Today and is going to be speaking with Dr Arlean Hopping office tomorrow.

## 2019-07-19 ENCOUNTER — Other Ambulatory Visit: Payer: Self-pay | Admitting: Physician Assistant

## 2019-07-19 ENCOUNTER — Ambulatory Visit (HOSPITAL_COMMUNITY)
Admission: RE | Admit: 2019-07-19 | Discharge: 2019-07-19 | Disposition: A | Discharge status: Home / Self Care | Payer: Medicare Other | Source: Ambulatory Visit | Attending: Interventional Radiology | Admitting: Interventional Radiology

## 2019-07-19 ENCOUNTER — Other Ambulatory Visit: Payer: Self-pay | Admitting: Student

## 2019-07-19 DIAGNOSIS — R200 Anesthesia of skin: Secondary | ICD-10-CM

## 2019-07-19 DIAGNOSIS — R531 Weakness: Secondary | ICD-10-CM

## 2019-07-19 DIAGNOSIS — R202 Paresthesia of skin: Secondary | ICD-10-CM

## 2019-07-19 DIAGNOSIS — R42 Dizziness and giddiness: Secondary | ICD-10-CM

## 2019-07-19 DIAGNOSIS — I671 Cerebral aneurysm, nonruptured: Secondary | ICD-10-CM

## 2019-07-19 DIAGNOSIS — I639 Cerebral infarction, unspecified: Secondary | ICD-10-CM

## 2019-07-19 LAB — PLATELET INHIBITION P2Y12: Platelet Function  P2Y12: 135 [PRU] — ABNORMAL LOW (ref 182–335)

## 2019-07-19 NOTE — Telephone Encounter (Signed)
Noted. I'll await the consult note.  Thanks.

## 2019-07-19 NOTE — Consult Note (Signed)
Chief Complaint: Patient was seen in consultation today to discuss recent MRI/MRA results  Supervising Physician: Luanne Bras  Patient Status: [facility] - Out-pt  History of Present Illness: Alejandro Schneider is a 69 y.o. male with a past medical history as below, pertinent past medical history including HTN, HLD and CVA (05/20/14) who presents today to discuss the results of his recent MRI brain and MRA head as well as recent new symptoms. Briefly, Alejandro Schneider presented to the ED on 05/20/14 with complaints of left facial numbness, head and difficulty walking. He was found to have an equivocal hyperdense right MCA without intracranial hemorrhage on CT head w/o contrast. 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 He was admitted for further evaluation and management by neurology, ultimately being discharged to inpatient rehab on 05/24/19 with plans for outpatient NIR follow up. He has undergone regular imaging as well as a repeat diagnostic cerebral angiogram on 09/19/2015 which have all been stable thus far.   Alejandro Schneider reported to his PCP on 4/13 that he was experiencing RUE and RLE tingling, difficulty walking, intermittent lightheadedness, daily headaches for a brief period of time each day and dyspnea with exertion.  He was instructed to present to the ED for evaluation which he declined as he had recently undergone imaging which was stable, he was then instructed to contact NIR. He contacted our service that same day and underwent MRI brain and MRA head on 4/14, he presents today to review his imaging results and recently reported symptoms.   Alejandro Schneider presents with his wife today, he states that a couple weeks ago he began to experience tingling in his right hand, right forearm and right toes which was not associated with any other symptoms and did not affect his ability to walk or perform his regular daily tasks. He did have some difficulty walking due to weakness in his right knee which is unchanged from when he left the hospital. He reported a cold sensation in his right foot however the foot was warm to touch and there were no color changes. 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 He also reports a worsening feeling that he describes as lightheadedness and that it "just doesn't feel right," he denies any sensation of spinning, syncope or vision changes. The lightheadedness is not associated with any other symptoms and is unchanged with activity or position and does not seem to occur at any particular time. 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 At home his blood pressure is typically 130-150/80-90. He has been taking his BP medication as well as Plavix regularly.  Past Medical History:  Diagnosis Date  . CVA (cerebral infarction)   . Hyperlipidemia   .  Hypertension   . Internal hemorrhoids   . Stroke [facility])     Past Surgical History:  Procedure Laterality Date  . COLONOSCOPY  2006  . INGUINAL HERNIA REPAIR  10/16/02   Right  Dr Hassell Done  . LUMBAR LAMINECTOMY/DECOMPRESSION MICRODISCECTOMY Right 05/30/2012    Procedure: LUMBAR LAMINECTOMY/DECOMPRESSION MICRODISCECTOMY 1 LEVEL;  Surgeon: Faythe Ghee, MD;  Location: [facility];  Service: Neurosurgery;  Laterality: Right;  . RADIOLOGY WITH ANESTHESIA N/A 05/21/2014   Procedure: ANGIOPLASTY POSSIBLE STENT;  Surgeon: Rob Hickman, MD;  Location: [place];  Service: Radiology;  Laterality: N/A;  . TONSILLECTOMY  1972    Allergies: Bee venom, Gabapentin, Lipitor [atorvastatin], and Simvastatin  Medications: Prior to Admission medications   Medication Sig Start Date End Date Taking? Authorizing Provider  amLODipine (NORVASC) 5 MG tablet Take 1 tablet (5 mg total) by mouth daily. 09/26/18   Tonia Ghent, MD  clopidogrel (PLAVIX) 75 MG tablet TAKE 1 TABLET (75 MG TOTAL) BY MOUTH DAILY. 09/26/18   Tonia Ghent, MD  clotrimazole-betamethasone (LOTRISONE) cream Apply 1 application topically 2 (two) times daily as needed. Patient not taking: Reported on 09/26/2018 05/08/18   Tonia Ghent, MD  Multiple Vitamins-Minerals (MULTIVITAMIN,TX-MINERALS) tablet Take 1 tablet by mouth daily.      [provider]  Omega-3 Fatty Acids (FISH OIL) 1000 MG CAPS Take 1,000 mg by mouth daily.    [provider]  rosuvastatin (CRESTOR) 10 MG tablet Take 1 tablet (10 mg total) by mouth daily. 09/26/18   Tonia Ghent, MD     Family History  Problem Relation Age of Onset  . Heart disease Father        CABGx4  . Cancer Father        Prostate (Radiation)  . Prostate cancer Father   . Heart disease Paternal Grandfather        MI  . Depression Mother   . Stroke Maternal Grandmother   . Prostate cancer Brother   . Diabetes Cousin   . Hypertension Neg Hx   . Drug abuse Neg Hx   . Alcohol abuse Neg Hx   . Colon cancer Neg Hx   . Esophageal cancer Neg Hx   . Pancreatic cancer Neg Hx   . Rectal cancer Neg Hx   . Stomach cancer Neg Hx     Social History   Socioeconomic History  . Marital status: Married    Spouse name: Not on file   . Number of children: 1  . Years of education: Not on file  . Highest education level: Not on file  Occupational History  . Occupation: Counselling psychologist  Tobacco Use  . Smoking status: Never Smoker  . Smokeless tobacco: Never Used  Substance and Sexual Activity  . Alcohol use: Yes    Alcohol/week: 2.0 standard drinks    Types: 2 Cans of beer per week  . Drug use: No  . Sexual activity: Yes  Other Topics Concern  . Not on file  Social History Narrative   Married 1974   1 son, Engineer, agricultural (In [place]) [place]   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[employer]:   . Difficulty of Paying Living Expenses:   Food Insecurity:   . Worried About Charity fundraiser in the Last Year:   . Arboriculturist in the Last Year:   Transportation Needs:   .  Lack of Transportation (Medical):   Marland Kitchen Lack of Transportation (Non-Medical):   Physical Activity:   . Days of Exercise per Week:   . Minutes of Exercise per Session:   Stress:   . Feeling of Stress :   Social Connections:   . Frequency of Communication with Friends and Family:   . Frequency of Social Gatherings with Friends and Family:   . Attends Religious Services:   . Active Member of Clubs or Organizations:   . Attends Archivist Meetings:   Marland Kitchen Marital Status:      Review of Systems: A 12 point ROS discussed and pertinent positives are indicated in the HPI above.  All other systems are negative.  Review of Systems  Constitutional: Negative for activity change, appetite change, chills and fever.  HENT: Negative for tinnitus and trouble swallowing.   Eyes: Negative for visual disturbance.  Respiratory: Positive for shortness of breath (with significant exertion). Negative for cough.   Cardiovascular: Negative for chest pain.  Gastrointestinal: Negative for abdominal pain, diarrhea, nausea and vomiting.  Musculoskeletal: Positive for arthralgias, gait  problem and myalgias. Negative for neck pain.  Neurological: Positive for light-headedness, numbness and headaches. Negative for dizziness, seizures, syncope, facial asymmetry, speech difficulty and weakness.  Psychiatric/Behavioral: Negative for confusion.    Vital Signs: There were no vitals taken for this visit.  Physical Exam Constitutional:      General: He is not in acute distress.    Comments: Wife present during consultation.  HENT:     Head: Normocephalic.  Pulmonary:     Effort: Pulmonary effort is normal.  Skin:    General: Skin is warm and dry.  Neurological:     General: No focal deficit present.     Mental Status: He is alert and oriented to person, place, and time.     Motor: No weakness.     Gait: Gait normal.  Psychiatric:        Mood and Affect: Mood normal.        Behavior: Behavior normal.        Thought Content: Thought content normal.        Judgment: Judgment normal.      Imaging: MR ANGIO HEAD WO CONTRAST  Result Date: 07/19/2019 CLINICAL DATA:  Dizziness with numbness and tingling of right upper and lower extremity. History of stroke. Brain aneurysm. EXAM: MRI HEAD WITHOUT CONTRAST MRA HEAD WITHOUT CONTRAST TECHNIQUE: Multiplanar, multiecho pulse sequences of the brain and surrounding structures were obtained without intravenous contrast. Angiographic images of the head were obtained using MRA technique without contrast. COMPARISON:  05/02/2019 FINDINGS: MRI HEAD FINDINGS Brain: No acute or interval infarction, hemorrhage, hydrocephalus, extra-axial collection or mass lesion. Small remote infarcts in the left cerebellum and left lateral medulla Vascular: Chronic loss of flow within the left V4 segment. Skull and upper cervical spine: Normal marrow signal Sinuses/Orbits: Atelectasis or posttraumatic deformity of the left maxillary sinus. MRA HEAD FINDINGS Chronic absent flow in the left vertebral artery with reconstitution from the basilar. There is severe  stenosis at the vertebrobasilar junction; both picas show stable patency. Diminutive basilar at baseline due to fetal type bilateral PCA flow. There is superimposed advanced stenosis just before the basilar bifurcation. No flow limiting stenosis in the anterior circulation. 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 IMPRESSION: 1. Stable compared to January 2021. 2. Small remote left lateral medulla and cerebellar infarcts. 3. 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 4. 3 mm left ICA aneurysm that is unchanged. Electronically Signed   By: Monte Fantasia M.D.   On: 07/19/2019 06:30   MR BRAIN WO CONTRAST  Result Date: 07/19/2019 CLINICAL DATA:  Dizziness with numbness and tingling of right upper and lower extremity. History of stroke. Brain aneurysm. EXAM: MRI HEAD WITHOUT CONTRAST MRA HEAD WITHOUT CONTRAST TECHNIQUE: Multiplanar, multiecho pulse sequences of the brain and surrounding structures were obtained without intravenous contrast. Angiographic images of the head were obtained using MRA technique without contrast. COMPARISON:  05/02/2019 FINDINGS: MRI HEAD FINDINGS Brain: No acute or interval infarction, hemorrhage, hydrocephalus, extra-axial collection or mass lesion. Small remote infarcts in the left cerebellum and left lateral medulla Vascular: Chronic loss of flow within the left V4 segment. Skull and upper cervical spine: Normal marrow signal Sinuses/Orbits: Atelectasis or posttraumatic deformity of the left maxillary sinus. MRA HEAD FINDINGS Chronic absent flow in the left vertebral artery with reconstitution from the basilar. There is severe stenosis at the vertebrobasilar junction; both picas show stable patency. Diminutive basilar at baseline due to fetal type bilateral PCA flow. There is superimposed advanced stenosis just before the basilar  bifurcation. No flow limiting stenosis in the anterior circulation. 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 IMPRESSION: 1. Stable compared to January 2021. 2. Small remote left lateral medulla and cerebellar infarcts. 3. 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 4. 3 mm left ICA aneurysm that is unchanged. Electronically Signed   By: Monte Fantasia M.D.   On: 07/19/2019 06:30    Labs:  CBC: No results for input(s): WBC, HGB, HCT, PLT in the last 8760 hours.  COAGS: No results for input(s): INR, APTT in the last 8760 hours.  BMP: Recent Labs    09/20/18 0835  NA 140  K 4.3  CL 105  CO2 28  GLUCOSE 115*  BUN 13  CALCIUM 9.1  CREATININE 0.86    LIVER FUNCTION TESTS: Recent Labs    09/20/18 0835  BILITOT 0.7  AST 19  ALT 26  ALKPHOS 69  PROT 6.4  ALBUMIN 4.3    TUMOR MARKERS: No results for input(s): AFPTM, CEA, CA199, CHROMGRNA in the last 8760 hours.  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 He was ultimately d/ced to rehab and then home. He has been followed with regular imaging by our service. He recently reported experiencing RUE and RLE tingling, difficulty walking, intermittent lightheadedness, daily headaches for a brief period of time each day and dyspnea with exertion. MRI brain w/o contrast and MRA head w/o contrast was performed on 07/18/19 and he presents today to discuss his symptoms/imaging  results.   MRI/MRA Findings: Chronic absent flow in the left vertebral artery with reconstitution from the basilar. There is severe stenosis at the vertebrobasilar junction; both picas show stable patency. Diminutive basilar at baseline due to fetal type bilateral PCA flow. There is superimposed advanced stenosis just before the basilar bifurcation.  No flow limiting stenosis in the anterior circulation. There is an unchanged 3 mm laterally directed left mid cavernous ICA aneurysm  Stable compared to January 2021.  Dr. Estanislado Pandy was present for consultation.  Discussed unchanged appearance of MRI/MRA, specifically that no new strokes were noted, however cannot exclude TIA as contributing to the cause of some of his symptoms. We discussed that because of his anatomy and overall minimal symptoms the risks of an angioplasty outweighs any potential benefit at this time, however if his symptoms were to worsen we would likely proceed with angioplasty. Patient and his wife state understanding and are agreeable to this plan.  Plan for follow-up:  1. P2Y12 today to ensure Plavix is working effectively - will plan further follow up after resulted. 2. Add ASA 81 mg QD 3. Maintain adequate BP/HLD control 4. Maintain follow up appointments with other services 5. Continue to monitor for changes or worsening in current symptoms OR new symptoms including, but not limited to, vision changes, extremity weakness, trouble with speech/swallowing, facial droop, chest pain, etc. Patient instructed to call our office or call 911 if he experiences severe symptoms.  Informed patient that our schedulers will call with next follow up appointment once P2Y12 is resulted and reviewed by Dr. Estanislado Pandy.  All questions answered and concerns addressed. Patient conveys understanding and agrees with plan.  Thank you for this interesting consult.  I greatly enjoyed meeting MORDEKAI WILLE and look forward to  participating in their care.  A copy of this report was sent to the requesting provider on this date.  Electronically Signed: Joaquim Nam, PA-C 07/19/2019, 9:16 AM   I spent a total of   40 Minutes in face to face in clinical consultation, greater than 50% of which was counseling/coordinating care for follow up MRI/MRA head.

## 2019-07-23 ENCOUNTER — Telehealth (HOSPITAL_COMMUNITY): Payer: Self-pay

## 2019-07-23 ENCOUNTER — Other Ambulatory Visit: Payer: Self-pay | Admitting: Radiology

## 2019-07-23 MED ORDER — ASPIRIN EC 325 MG PO TBEC: 325.0000 mg | DELAYED_RELEASE_TABLET | Freq: Every day | ORAL | 0 refills | Status: DC

## 2019-07-23 NOTE — Telephone Encounter (Signed)
Spoke to PA Ascencion Dike) after discussion with Dr. Estanislado Pandy regarding P2Y12 results. Pt will be due for f/u mri/mra in 6 months. Pt is aware. AW

## 2019-09-27 ENCOUNTER — Ambulatory Visit (INDEPENDENT_AMBULATORY_CARE_PROVIDER_SITE_OTHER): Payer: Medicare Other

## 2019-09-27 VITALS — BP 135/80 | Wt 215.0 lb

## 2019-09-27 DIAGNOSIS — Z0000 Encounter for general adult medical examination without abnormal findings: Secondary | ICD-10-CM

## 2019-09-27 NOTE — Patient Instructions (Signed)
[name] , Thank you for taking time to come for your Medicare Wellness Visit. I appreciate your ongoing commitment to your health goals. Please review the following plan we discussed and let me know if I can assist you in the future.   Screening recommendations/referrals: Colonoscopy: Up to date, completed 12/15/2015, due 12/2020 Recommended yearly ophthalmology/optometry visit for glaucoma screening and checkup Recommended yearly dental visit for hygiene and checkup  Vaccinations: Influenza vaccine: Up to date, completed 01/19/2019, due 11/2019 Pneumococcal vaccine: Completed series Tdap vaccine: Up to date, completed 09/05/2012, due 09/2022 Shingles vaccine: due, check with your insurance for coverage   Covid-19: Completed series  Advanced directives: Advance directive discussed with you today. I have provided a copy for you to complete at home and have notarized. Once this is complete please bring a copy in to our office so we can scan it into your chart.  Conditions/risks identified: hypertension, hyperlipidemia  Next appointment: Follow up in one year for your annual wellness visit.   Preventive Care 32 Years and Older, Male Preventive care refers to lifestyle choices and visits with your health care provider that can promote health and wellness. What does preventive care include?  A yearly physical exam. This is also called an annual well check.  Dental exams once or twice a year.  Routine eye exams. Ask your health care provider how often you should have your eyes checked.  Personal lifestyle choices, including:  Daily care of your teeth and gums.  Regular physical activity.  Eating a healthy diet.  Avoiding tobacco and drug use.  Limiting alcohol use.  Practicing safe sex.  Taking low doses of aspirin every day.  Taking vitamin and mineral supplements as recommended by your health care provider. What happens during an annual well check? The services and  screenings done by your health care provider during your annual well check will depend on your age, overall health, lifestyle risk factors, and family history of disease. Counseling  Your health care provider may ask you questions about your:  Alcohol use.  Tobacco use.  Drug use.  Emotional well-being.  Home and relationship well-being.  Sexual activity.  Eating habits.  History of falls.  Memory and ability to understand (cognition).  Work and work Statistician. Screening  You may have the following tests or measurements:  Height, weight, and BMI.  Blood pressure.  Lipid and cholesterol levels. These may be checked every 5 years, or more frequently if you are over 45 years old.  Skin check.  Lung cancer screening. You may have this screening every year starting at age 56 if you have a 30-pack-year history of smoking and currently smoke or have quit within the past 15 years.  Fecal occult blood test (FOBT) of the stool. You may have this test every year starting at age 38.  Flexible sigmoidoscopy or colonoscopy. You may have a sigmoidoscopy every 5 years or a colonoscopy every 10 years starting at age 65.  Prostate cancer screening. Recommendations will vary depending on your family history and other risks.  Hepatitis C blood test.  Hepatitis B blood test.  Sexually transmitted disease (STD) testing.  Diabetes screening. This is done by checking your blood sugar (glucose) after you have not eaten for a while (fasting). You may have this done every 1-3 years.  Abdominal aortic aneurysm (AAA) screening. You may need this if you are a current or former smoker.  Osteoporosis. You may be screened starting at age 53 if you are at  high risk. Talk with your health care provider about your test results, treatment options, and if necessary, the need for more tests. Vaccines  Your health care provider may recommend certain vaccines, such as:  Influenza vaccine. This is  recommended every year.  Tetanus, diphtheria, and acellular pertussis (Tdap, Td) vaccine. You may need a Td booster every 10 years.  Zoster vaccine. You may need this after age 34.  Pneumococcal 13-valent conjugate (PCV13) vaccine. One dose is recommended after age 58.  Pneumococcal polysaccharide (PPSV23) vaccine. One dose is recommended after age 14. Talk to your health care provider about which screenings and vaccines you need and how often you need them. This information is not intended to replace advice given to you by your health care provider. Make sure you discuss any questions you have with your health care provider. Document Released: 04/18/2015 Document Revised: 12/10/2015 Document Reviewed: 01/21/2015 Elsevier Interactive Patient Education  2017 [place] Prevention in the Home Falls can cause injuries. They can happen to people of all ages. There are many things you can do to make your home safe and to help prevent falls. What can I do on the outside of my home?  Regularly fix the edges of walkways and driveways and fix any cracks.  Remove anything that might make you trip as you walk through a door, such as a raised step or threshold.  Trim any bushes or trees on the path to your home.  Use bright outdoor lighting.  Clear any walking paths of anything that might make someone trip, such as rocks or tools.  Regularly check to see if handrails are loose or broken. Make sure that both sides of any steps have handrails.  Any raised decks and porches should have guardrails on the edges.  Have any leaves, snow, or ice cleared regularly.  Use sand or salt on walking paths during winter.  Clean up any spills in your garage right away. This includes oil or grease spills. What can I do in the bathroom?  Use night lights.  Install grab bars by the toilet and in the tub and shower. Do not use towel bars as grab bars.  Use non-skid mats or decals in the tub or  shower.  If you need to sit down in the shower, use a plastic, non-slip stool.  Keep the floor dry. Clean up any water that spills on the floor as soon as it happens.  Remove soap buildup in the tub or shower regularly.  Attach bath mats securely with double-sided non-slip rug tape.  Do not have throw rugs and other things on the floor that can make you trip. What can I do in the bedroom?  Use night lights.  Make sure that you have a light by your bed that is easy to reach.  Do not use any sheets or blankets that are too big for your bed. They should not hang down onto the floor.  Have a firm chair that has side arms. You can use this for support while you get dressed.  Do not have throw rugs and other things on the floor that can make you trip. What can I do in the kitchen?  Clean up any spills right away.  Avoid walking on wet floors.  Keep items that you use a lot in easy-to-reach places.  If you need to reach something above you, use a strong step stool that has a grab bar.  Keep electrical cords out of the  way.  Do not use floor polish or wax that makes floors slippery. If you must use wax, use non-skid floor wax.  Do not have throw rugs and other things on the floor that can make you trip. What can I do with my stairs?  Do not leave any items on the stairs.  Make sure that there are handrails on both sides of the stairs and use them. Fix handrails that are broken or loose. Make sure that handrails are as long as the stairways.  Check any carpeting to make sure that it is firmly attached to the stairs. Fix any carpet that is loose or worn.  Avoid having throw rugs at the top or bottom of the stairs. If you do have throw rugs, attach them to the floor with carpet tape.  Make sure that you have a light switch at the top of the stairs and the bottom of the stairs. If you do not have them, ask someone to add them for you. What else can I do to help prevent  falls?  Wear shoes that:  Do not have high heels.  Have rubber bottoms.  Are comfortable and fit you well.  Are closed at the toe. Do not wear sandals.  If you use a stepladder:  Make sure that it is fully opened. Do not climb a closed stepladder.  Make sure that both sides of the stepladder are locked into place.  Ask someone to hold it for you, if possible.  Clearly mark and make sure that you can see:  Any grab bars or handrails.  First and last steps.  Where the edge of each step is.  Use tools that help you move around (mobility aids) if they are needed. These include:  Canes.  Walkers.  Scooters.  Crutches.  Turn on the lights when you go into a dark area. Replace any light bulbs as soon as they burn out.  Set up your furniture so you have a clear path. Avoid moving your furniture around.  If any of your floors are uneven, fix them.  If there are any pets around you, be aware of where they are.  Review your medicines with your doctor. Some medicines can make you feel dizzy. This can increase your chance of falling. Ask your doctor what other things that you can do to help prevent falls. This information is not intended to replace advice given to you by your health care provider. Make sure you discuss any questions you have with your health care provider. Document Released: 01/16/2009 Document Revised: 08/28/2015 Document Reviewed: 04/26/2014 Elsevier Interactive Patient Education  2017 [employer].

## 2019-09-27 NOTE — Progress Notes (Signed)
Subjective:   Alejandro Schneider is a 69 y.o. male who presents for an Initial Medicare Annual Wellness Visit.  Review of Systems: N/A      I connected with the patient today by telephone and verified that I am speaking with the correct person using two identifiers. Location patient: home Location nurse: work Persons participating in the virtual visit: patient, Marine scientist.   I discussed the limitations, risks, security and privacy concerns of performing an evaluation and management service by telephone and the availability of in person appointments. I also discussed with the patient that there may be a patient responsible charge related to this service. The patient expressed understanding and verbally consented to this telephonic visit.    Interactive audio and video telecommunications were attempted between this nurse and patient, however failed, due to patient having technical difficulties OR patient did not have access to video capability.  We continued and completed visit with audio only.     Cardiac Risk Factors include: advanced age (>87men, >71 women);hypertension;dyslipidemia;male gender     Objective:    Today's Vitals   09/27/19 1529  BP: 135/80  Weight: 215 lb (97.5 kg)   Body mass index is 31.75 kg/m.  Advanced Directives 09/27/2019 09/19/2018 09/19/2015 12/17/2014 10/28/2014 07/22/2014 05/23/2014  Does Patient Have a Medical Advance Directive? No No No No No No No  Would patient like information on creating a medical advance directive? Yes (MAU/Ambulatory/Procedural Areas - Information given) No - Patient declined No - patient declined information Yes - Educational materials given No - patient declined information Yes - Educational materials given Yes - Educational materials given    Current Medications (verified) Outpatient Encounter Medications as of 09/27/2019  Medication Sig  . amLODipine (NORVASC) 5 MG tablet Take 1 tablet (5 mg total) by mouth daily.  Marland Kitchen aspirin EC 325 MG  tablet Take 1 tablet (325 mg total) by mouth daily.  . clopidogrel (PLAVIX) 75 MG tablet TAKE 1 TABLET (75 MG TOTAL) BY MOUTH DAILY.  . Multiple Vitamins-Minerals (MULTIVITAMIN,TX-MINERALS) tablet Take 1 tablet by mouth daily.    . Omega-3 Fatty Acids (FISH OIL) 1000 MG CAPS Take 1,000 mg by mouth daily.  . rosuvastatin (CRESTOR) 10 MG tablet Take 1 tablet (10 mg total) by mouth daily.  . clotrimazole-betamethasone (LOTRISONE) cream Apply 1 application topically 2 (two) times daily as needed. (Patient not taking: Reported on 09/27/2019)   No facility-administered encounter medications on file as of 09/27/2019.    Allergies (verified) Bee venom, Gabapentin, Lipitor [atorvastatin], and Simvastatin   History: Past Medical History:  Diagnosis Date  . CVA (cerebral infarction)   . Hyperlipidemia   . Hypertension   . Internal hemorrhoids   . Stroke [facility])    Past Surgical History:  Procedure Laterality Date  . COLONOSCOPY  2006  . INGUINAL HERNIA REPAIR  10/16/02   Right  Dr Hassell Done  . LUMBAR LAMINECTOMY/DECOMPRESSION MICRODISCECTOMY Right 05/30/2012   Procedure: LUMBAR LAMINECTOMY/DECOMPRESSION MICRODISCECTOMY 1 LEVEL;  Surgeon: Faythe Ghee, MD;  Location: [facility];  Service: Neurosurgery;  Laterality: Right;  . RADIOLOGY WITH ANESTHESIA N/A 05/21/2014   Procedure: ANGIOPLASTY POSSIBLE STENT;  Surgeon: Rob Hickman, MD;  Location: [place];  Service: Radiology;  Laterality: N/A;  . TONSILLECTOMY  1972   Family History  Problem Relation Age of Onset  . Heart disease Father        CABGx4  . Cancer Father        Prostate (Radiation)  . Prostate cancer Father   .  Heart disease Paternal Grandfather        MI  . Depression Mother   . Stroke Maternal Grandmother   . Prostate cancer Brother   . Diabetes Cousin   . Hypertension Neg Hx   . Drug abuse Neg Hx   . Alcohol abuse Neg Hx   . Colon cancer Neg Hx   . Esophageal cancer Neg Hx   . Pancreatic cancer Neg Hx   . Rectal  cancer Neg Hx   . Stomach cancer Neg Hx    Social History   Socioeconomic History  . Marital status: Married    Spouse name: Not on file  . Number of children: 1  . Years of education: Not on file  . Highest education level: Not on file  Occupational History  . Occupation: Counselling psychologist  Tobacco Use  . Smoking status: Never Smoker  . Smokeless tobacco: Never Used  Substance and Sexual Activity  . Alcohol use: Yes    Alcohol/week: 2.0 standard drinks    Types: 2 Cans of beer per week  . Drug use: No  . Sexual activity: Yes  Other Topics Concern  . Not on file  Social History Narrative   Married 1974   1 son, Engineer, agricultural (In [place]) [place]   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[employer]: [place]   . Difficulty of Paying Living Expenses: Not hard at all  Food Insecurity: No Food Insecurity  . Worried About Charity fundraiser in the Last Year: Never true  . Ran Out of Food in the Last Year: Never true  Transportation Needs: No Transportation Needs  . Lack of Transportation (Medical): No  . Lack of Transportation (Non-Medical): No  Physical Activity: Sufficiently Active  . Days of Exercise per Week: 7 days  . Minutes of Exercise per Session: 30 min  Stress: No Stress Concern Present  . Feeling of Stress : Not at all  Social Connections:   . Frequency of Communication with Friends and Family:   . Frequency of Social Gatherings with Friends and Family:   . Attends Religious Services:   . Active Member of Clubs or Organizations:   . Attends Archivist Meetings:   Marland Kitchen Marital Status:     Tobacco Counseling Counseling given: Not Answered   Clinical Intake:  Pre-visit preparation completed: Yes  Pain : No/denies pain     Nutritional Risks: None Diabetes: No  How often do you need to have someone help you when you read instructions, pamphlets, or other written materials from your  doctor or pharmacy?: 1 - Never What is the last grade level you completed in school?: 12th  Diabetic: no Nutrition Risk Assessment:  Has the patient had any N/V/D within the last 2 months?  No  Does the patient have any non-healing wounds?  No  Has the patient had any unintentional weight loss or weight gain?  No   Diabetes:  Is the patient diabetic?  No  If diabetic, was a CBG obtained today?  No  Did the patient bring in their glucometer from home?  No  How often do you monitor your CBG's? N/A.   Financial Strains and Diabetes Management:  Are you having any financial strains with the device, your supplies or your medication? No .  Does the patient want to be seen by Chronic Care Management for management of their diabetes?  No  Would the patient like to be referred to a Nutritionist or for Diabetic Management?  No     Interpreter Needed?: No  Information entered by :: CJohnson, LPN   Activities of Daily Living In your present state of health, do you have any difficulty performing the following activities: 09/27/2019  Hearing? N  Vision? N  Difficulty concentrating or making decisions? N  Walking or climbing stairs? N  Dressing or bathing? N  Doing errands, shopping? N  Preparing Food and eating ? N  Using the Toilet? N  In the past six months, have you accidently leaked urine? N  Do you have problems with loss of bowel control? N  Managing your Medications? N  Managing your Finances? N  Housekeeping or managing your Housekeeping? N  Some recent data might be hidden    Patient Care Team: Tonia Ghent, MD as PCP - General (Family Medicine)  Indicate any recent Medical Services you may have received from other than [facility] providers in the past year (date may be approximate).     Assessment:   This is a routine wellness examination for [name].  Hearing/Vision screen  Hearing Screening   125Hz  250Hz  500Hz  1000Hz  2000Hz  3000Hz  4000Hz  6000Hz  8000Hz   Right  ear:           Left ear:           Vision Screening Comments: Patient gets annual eye exams  Dietary issues and exercise activities discussed: Current Exercise Habits: Home exercise routine, Type of exercise: walking, Time (Minutes): 30, Frequency (Times/Week): 7, Weekly Exercise (Minutes/Week): 210, Intensity: Moderate, Exercise limited by: None identified  Goals    . Patient Stated     Starting 09/20/18, I will continue to take medications as prescribed.     . Patient Stated     09/27/2019, I will continue to walk everyday for 30 minutes.      Depression Screen PHQ 2/9 Scores 09/27/2019 09/19/2018 10/19/2017 10/01/2016 12/17/2014 10/28/2014 07/12/2014  PHQ - 2 Score 0 0 0 0 0 0 0  PHQ- 9 Score 0 0 - - - - 1    Fall Risk Fall Risk  09/27/2019 09/19/2018 10/19/2017 11/24/2015 05/26/2015  Falls in the past year? 0 0 No No No  Number falls in past yr: 0 - - - -  Injury with Fall? 0 - - - -  Risk for fall due to : Medication side effect - - - -  Follow up Falls evaluation completed;Falls prevention discussed - - - -    Any stairs in or around the home? Yes  If so, are there any without handrails? No  Home free of loose throw rugs in walkways, pet beds, electrical cords, etc? Yes  Adequate lighting in your home to reduce risk of falls? Yes   ASSISTIVE DEVICES UTILIZED TO PREVENT FALLS:  Life alert? No  Use of a cane, walker or w/c? No  Grab bars in the bathroom? No  Shower chair or bench in shower? No  Elevated toilet seat or a handicapped toilet? No   TIMED UP AND GO:  Was the test performed? N/A, telephonic visit .    Cognitive Function: MMSE - Mini Mental State Exam 09/27/2019 09/19/2018  Not completed: Refused -  Orientation to time - 5  Orientation to Place - 5  Registration - 3  Attention/ Calculation - 0  Recall - 3  Language- name 2 objects - 0  Language- repeat - 1  Language- follow 3 step command -  0  Language- read & follow direction - 0  Write a sentence - 0  Copy  design - 0  Total score - 17  Mini Cog  Mini-Cog screen was completed. Maximum score is 22. A value of 0 denotes this part of the MMSE was not completed or the patient failed this part of the Mini-Cog screening.       Immunizations Immunization History  Administered Date(s) Administered  . Influenza, High Dose Seasonal PF 02/01/2017  . Influenza-Unspecified 02/01/2014, 01/19/2019  . Moderna SARS-COVID-2 Vaccination 04/10/2019, 05/07/2019  . Pneumococcal Conjugate-13 10/01/2016  . Pneumococcal Polysaccharide-23 10/19/2017  . Td 04/05/2002  . Tdap 09/05/2012  . Zoster 09/06/2013    TDAP status: Up to date Flu Vaccine status: Up to date Pneumococcal vaccine status: Up to date Covid-19 vaccine status: Completed vaccines  Qualifies for Shingles Vaccine? Yes   Zostavax completed Yes   Shingrix Completed?: No.    Education has been provided regarding the importance of this vaccine. Patient has been advised to call insurance company to determine out of pocket expense if they have not yet received this vaccine. Advised may also receive vaccine at local pharmacy or Health Dept. Verbalized acceptance and understanding.  Screening Tests Health Maintenance  Topic Date Due  . INFLUENZA VACCINE  11/04/2019  . COLONOSCOPY  12/14/2020  . TETANUS/TDAP  09/06/2022  . COVID-19 Vaccine  Completed  . Hepatitis C Screening  Completed  . PNA vac Low Risk Adult  Completed    Health Maintenance  There are no preventive care reminders to display for this patient.  Colorectal cancer screening: Completed 12/15/2015. Repeat every 5 years  Lung Cancer Screening: (Low Dose CT Chest recommended if Age 69-80 years, 30 pack-year currently smoking OR have quit w/in 15years.) does not qualify.    Additional Screening:  Hepatitis C Screening: does qualify; Completed 09/23/2015  Vision Screening: Recommended annual ophthalmology exams for early detection of glaucoma and other disorders of the eye. Is  the patient up to date with their annual eye exam?  Yes  Who is the provider or what is the name of the office in which the patient attends annual eye exams? [facility] If pt is not established with a provider, would they like to be referred to a provider to establish care? No .   Dental Screening: Recommended annual dental exams for proper oral hygiene  Community Resource Referral / Chronic Care Management: CRR required this visit?  No   CCM required this visit?  No      Plan:     I have personally reviewed and noted the following in the patient's chart:   . Medical and social history . Use of alcohol, tobacco or illicit drugs  . Current medications and supplements . Functional ability and status . Nutritional status . Physical activity . Advanced directives . List of other physicians . Hospitalizations, surgeries, and ER visits in previous 12 months . Vitals . Screenings to include cognitive, depression, and falls . Referrals and appointments  In addition, I have reviewed and discussed with patient certain preventive protocols, quality metrics, and best practice recommendations. A written personalized care plan for preventive services as well as general preventive health recommendations were provided to patient.   Due to this being a telephonic visit, the after visit summary with patients personalized plan was offered to patient via mail or my-chart. Patient preferred to pick up at office at next visit.   Andrez Grime, LPN   [id number]/[id number]

## 2019-09-27 NOTE — Progress Notes (Signed)
PCP notes:  Health Maintenance: No gaps noted    Abnormal Screenings: none   Patient concerns: none   Nurse concerns: none   Next PCP appt: none 

## 2019-10-03 ENCOUNTER — Other Ambulatory Visit (INDEPENDENT_AMBULATORY_CARE_PROVIDER_SITE_OTHER): Payer: Medicare Other

## 2019-10-03 ENCOUNTER — Other Ambulatory Visit: Payer: Self-pay | Admitting: Family Medicine

## 2019-10-03 DIAGNOSIS — R739 Hyperglycemia, unspecified: Secondary | ICD-10-CM

## 2019-10-03 DIAGNOSIS — E785 Hyperlipidemia, unspecified: Secondary | ICD-10-CM | POA: Diagnosis not present

## 2019-10-03 DIAGNOSIS — Z125 Encounter for screening for malignant neoplasm of prostate: Secondary | ICD-10-CM

## 2019-10-03 LAB — COMPREHENSIVE METABOLIC PANEL
ALT: 26 U/L (ref 0–53)
AST: 18 U/L (ref 0–37)
Albumin: 4.6 g/dL (ref 3.5–5.2)
Alkaline Phosphatase: 63 U/L (ref 39–117)
BUN: 21 mg/dL (ref 6–23)
CO2: 25 mEq/L (ref 19–32)
Calcium: 9.1 mg/dL (ref 8.4–10.5)
Chloride: 106 mEq/L (ref 96–112)
Creatinine, Ser: 0.96 mg/dL (ref 0.40–1.50)
GFR: 77.72 mL/min (ref >60.00)
Glucose, Bld: 116 mg/dL — ABNORMAL HIGH (ref 70–99)
Potassium: 4.1 mEq/L (ref 3.5–5.1)
Sodium: 139 mEq/L (ref 135–145)
Total Bilirubin: 1 mg/dL (ref 0.2–1.2)
Total Protein: 6.7 g/dL (ref 6.0–8.3)

## 2019-10-03 LAB — LIPID PANEL
Cholesterol: 140 mg/dL (ref 0–200)
HDL: 41.8 mg/dL (ref >39.00)
LDL Cholesterol: 75 mg/dL (ref 0–99)
NonHDL: 97.91
Total CHOL/HDL Ratio: 3
Triglycerides: 116 mg/dL (ref 0.0–149.0)
VLDL: 23.2 mg/dL (ref 0.0–40.0)

## 2019-10-03 LAB — PSA, MEDICARE: PSA: 2.13 ng/ml (ref 0.10–4.00)

## 2019-10-03 LAB — HEMOGLOBIN A1C: Hgb A1c MFr Bld: 5.6 % (ref 4.6–6.5)

## 2019-10-12 ENCOUNTER — Telehealth: Payer: Self-pay

## 2019-10-12 MED ORDER — ROSUVASTATIN CALCIUM 10 MG PO TABS: 10.0000 mg | ORAL_TABLET | Freq: Every day | ORAL | 0 refills | Status: DC

## 2019-10-12 MED ORDER — CLOPIDOGREL BISULFATE 75 MG PO TABS: ORAL_TABLET | ORAL | 0 refills | Status: DC

## 2019-10-12 MED ORDER — AMLODIPINE BESYLATE 5 MG PO TABS: 5.0000 mg | ORAL_TABLET | Freq: Every day | ORAL | 0 refills | Status: DC

## 2019-10-12 NOTE — Telephone Encounter (Signed)
Rxs sent electronically.  

## 2019-10-16 ENCOUNTER — Ambulatory Visit (INDEPENDENT_AMBULATORY_CARE_PROVIDER_SITE_OTHER): Payer: Medicare Other | Admitting: Family Medicine

## 2019-10-16 ENCOUNTER — Other Ambulatory Visit: Payer: Self-pay

## 2019-10-16 ENCOUNTER — Encounter: Payer: Self-pay | Admitting: Family Medicine

## 2019-10-16 VITALS — BP 144/82 | HR 54 | Temp 96.2°F | Ht 69.0 in | Wt 216.0 lb

## 2019-10-16 DIAGNOSIS — I10 Essential (primary) hypertension: Secondary | ICD-10-CM

## 2019-10-16 DIAGNOSIS — Z8673 Personal history of transient ischemic attack (TIA), and cerebral infarction without residual deficits: Secondary | ICD-10-CM | POA: Diagnosis not present

## 2019-10-16 DIAGNOSIS — R739 Hyperglycemia, unspecified: Secondary | ICD-10-CM | POA: Diagnosis not present

## 2019-10-16 DIAGNOSIS — I639 Cerebral infarction, unspecified: Secondary | ICD-10-CM

## 2019-10-16 DIAGNOSIS — E785 Hyperlipidemia, unspecified: Secondary | ICD-10-CM | POA: Diagnosis not present

## 2019-10-16 DIAGNOSIS — Z0000 Encounter for general adult medical examination without abnormal findings: Secondary | ICD-10-CM

## 2019-10-16 DIAGNOSIS — Z7189 Other specified counseling: Secondary | ICD-10-CM

## 2019-10-16 MED ORDER — ROSUVASTATIN CALCIUM 10 MG PO TABS: 10.0000 mg | ORAL_TABLET | Freq: Every day | ORAL | 3 refills | Status: DC

## 2019-10-16 MED ORDER — AMLODIPINE BESYLATE 5 MG PO TABS: 5.0000 mg | ORAL_TABLET | Freq: Every day | ORAL | 3 refills | Status: DC

## 2019-10-16 MED ORDER — CLOPIDOGREL BISULFATE 75 MG PO TABS: ORAL_TABLET | ORAL | 3 refills | Status: DC

## 2019-10-16 NOTE — Patient Instructions (Addendum)
Check with your insurance to see if they will cover the shingrix shot. Update me as needed.  Take care.  Glad to see you.

## 2019-10-16 NOTE — Progress Notes (Signed)
This visit occurred during the SARS-CoV-2 public health emergency.  Safety protocols were in place, including screening questions prior to the visit, additional usage of staff PPE, and extensive cleaning of exam room while observing appropriate contact time as indicated for disinfecting solutions.  Hypertension:    Using medication without problems or lightheadedness: yes, amlodipine  Chest pain with exertion:no Edema:no Short of breath:no Average home BPs: variable BP, has been lower on home check.  I don't want to induce hypotension.     Elevated Cholesterol: Using medications without problems: yes Muscle aches: no Diet compliance:yes Exercise:yes,  Still on crestor.    Hyperglycemia d/w pt.  He has checked his sugar at home and it has been below 100.  Continued work on diet and exercise would help with sugar- and LDL, which is nearly at goal.  Labs d/w pt.    H/o CVA, back on ASA and plavix.  Still on statin.  Still on CCB.  He still has some episodic R arm and leg paresthesia.  No new neuro sx o/w.    Tetanus 2014 zostavax 2015, d/w pt about shingrix.   Flu prev done PNA 2019 covid 2021 Colonosocpy 2017.  PSA wnl. D/w pt. FH noted.  Living will d/w pt- wife designated if patient were incapacitated.  Diet and exercise d/w pt. Exercise as tolerated, diet is good.  He drove his motorcycle to [place] on trip recently.  He had a good trip.    PMH and SH reviewed  Meds, vitals, and allergies reviewed.   ROS: Per HPI unless specifically indicated in ROS section   GEN: nad, alert and oriented HEENT: ncat NECK: supple w/o LA CV: rrr. PULM: ctab, no inc wob ABD: soft, +bs EXT: no edema SKIN: no acute rash  He has some R shin but not calf discomfort but this isn't escalating.  ttp locally w/o erythema but no pain walking. No pain if not palpating the area.  We talked about options and he is going to monitor this in the meantime.  We both suspect a benign cause of the  discomfort that should resolve.

## 2019-10-17 NOTE — Assessment & Plan Note (Signed)
Living will d/w pt- wife designated if patient were incapacitated.   

## 2019-10-17 NOTE — Assessment & Plan Note (Signed)
Tetanus 2014 zostavax 2015, d/w pt about shingrix.   Flu prev done PNA 2019 covid 2021 Colonosocpy 2017.  PSA wnl. D/w pt. FH noted.  Living will d/w pt- wife designated if patient were incapacitated.  Diet and exercise d/w pt. Exercise as tolerated, diet is good.  He drove his motorcycle to [place] on trip recently.  He had a good trip.

## 2019-10-17 NOTE — Assessment & Plan Note (Signed)
variable BP, has been lower on home check.  I don't want to induce hypotension.    Would continue amlodipine as is.

## 2019-10-17 NOTE — Assessment & Plan Note (Signed)
No new symptoms.  Continue calcium channel blocker, statin, aspirin, Plavix.  He agrees.

## 2019-10-17 NOTE — Assessment & Plan Note (Signed)
  Hyperglycemia d/w pt.  He has checked his sugar at home and it has been below 100.  Continued work on diet and exercise would help with sugar- and LDL, which is nearly at goal.  Labs d/w pt. he agrees with plan.

## 2019-10-17 NOTE — Assessment & Plan Note (Signed)
Continue Crestor.  Continue work on diet and exercise.  He agrees.  Labs discussed with patient.

## 2020-01-10 ENCOUNTER — Other Ambulatory Visit: Payer: Self-pay | Admitting: *Deleted

## 2020-01-10 MED ORDER — ROSUVASTATIN CALCIUM 10 MG PO TABS: 10.0000 mg | ORAL_TABLET | Freq: Every day | ORAL | 3 refills | Status: DC

## 2020-01-10 MED ORDER — CLOPIDOGREL BISULFATE 75 MG PO TABS: ORAL_TABLET | ORAL | 3 refills | Status: DC

## 2020-01-10 MED ORDER — AMLODIPINE BESYLATE 5 MG PO TABS: 5.0000 mg | ORAL_TABLET | Freq: Every day | ORAL | 3 refills | Status: DC

## 2020-01-14 DIAGNOSIS — Z23 Encounter for immunization: Secondary | ICD-10-CM | POA: Diagnosis not present

## 2020-02-05 DIAGNOSIS — Z23 Encounter for immunization: Secondary | ICD-10-CM | POA: Diagnosis not present

## 2020-03-27 ENCOUNTER — Encounter: Payer: Self-pay | Admitting: Family Medicine

## 2020-03-27 ENCOUNTER — Other Ambulatory Visit: Payer: Self-pay | Admitting: Family Medicine

## 2020-03-27 MED ORDER — CLOTRIMAZOLE-BETAMETHASONE 1-0.05 % EX CREA: 1.0000 "application " | TOPICAL_CREAM | Freq: Two times a day (BID) | CUTANEOUS | 1 refills | Status: AC | PRN

## 2020-04-02 ENCOUNTER — Telehealth (HOSPITAL_COMMUNITY): Payer: Self-pay

## 2020-04-02 ENCOUNTER — Other Ambulatory Visit (HOSPITAL_COMMUNITY): Payer: Self-pay | Admitting: Interventional Radiology

## 2020-04-02 DIAGNOSIS — I671 Cerebral aneurysm, nonruptured: Secondary | ICD-10-CM

## 2020-04-02 NOTE — Telephone Encounter (Signed)
Called to schedule mri, no answer, left vm. AW 

## 2020-05-01 ENCOUNTER — Ambulatory Visit (HOSPITAL_COMMUNITY)
Admission: RE | Admit: 2020-05-01 | Discharge: 2020-05-01 | Disposition: A | Discharge status: Home / Self Care | Payer: Medicare Other | Source: Ambulatory Visit | Attending: Interventional Radiology | Admitting: Interventional Radiology

## 2020-05-01 ENCOUNTER — Other Ambulatory Visit: Payer: Self-pay

## 2020-05-01 ENCOUNTER — Ambulatory Visit (HOSPITAL_COMMUNITY): Payer: Medicare Other

## 2020-05-01 DIAGNOSIS — I671 Cerebral aneurysm, nonruptured: Secondary | ICD-10-CM | POA: Diagnosis not present

## 2020-05-01 DIAGNOSIS — I6502 Occlusion and stenosis of left vertebral artery: Secondary | ICD-10-CM | POA: Diagnosis not present

## 2020-05-05 ENCOUNTER — Telehealth (HOSPITAL_COMMUNITY): Payer: Self-pay

## 2020-05-05 NOTE — Telephone Encounter (Signed)
Pt agreed to f/u in 6 months with [facility] head wo. AW  

## 2020-08-11 ENCOUNTER — Other Ambulatory Visit: Payer: Self-pay

## 2020-08-11 ENCOUNTER — Ambulatory Visit (INDEPENDENT_AMBULATORY_CARE_PROVIDER_SITE_OTHER): Payer: Medicare Other | Admitting: Family Medicine

## 2020-08-11 ENCOUNTER — Encounter: Payer: Self-pay | Admitting: Family Medicine

## 2020-08-11 DIAGNOSIS — M79606 Pain in leg, unspecified: Secondary | ICD-10-CM

## 2020-08-11 MED ORDER — PREDNISONE 10 MG PO TABS: ORAL_TABLET | ORAL | 0 refills | Status: DC

## 2020-08-11 NOTE — Progress Notes (Signed)
This visit occurred during the SARS-CoV-2 public health emergency.  Safety protocols were in place, including screening questions prior to the visit, additional usage of staff PPE, and extensive cleaning of exam room while observing appropriate contact time as indicated for disinfecting solutions.  R knee pain.  No pain now when sitting but pain on full ext and standing.  Some pain on the medial anterior shin, distally.  Pain on standing after prolonged sitting.  No pain on motorcycle, with leg in extension.  Some R buttock pain.  He hasn't lately felt radicular pain during the day, may happen at night.  No knee trauma.    Going on about 3 weeks.    Meds, vitals, and allergies reviewed.   ROS: Per HPI unless specifically indicated in ROS section   GEN: nad, alert and oriented HEENT: ncat EXT: no ankle edema SKIN: no acute rash  B calf 37cm and equal.   R knee looks puffy but minimal crepitus on ROM.  Joint line not ttp.   R SLR neg but tight hamstring  No weakness R leg on gross testing.

## 2020-08-11 NOTE — Patient Instructions (Signed)
Use the back stretches and take prednisone with food.   Update me if not getting better.   Take care.  Glad to see you.

## 2020-08-13 DIAGNOSIS — M79606 Pain in leg, unspecified: Secondary | ICD-10-CM | POA: Insufficient documentation

## 2020-08-13 NOTE — Assessment & Plan Note (Signed)
Minimal crepitus on knee exam and the concern is for either L4 or L5 radicular symptoms, in spite of having negative straight leg raise at the time of exam.  Discussed options.  Steroid cautions given to patient.  He will use back exercises, take prednisone with routine cautions and update me as needed.  He agrees with plan.

## 2020-08-25 ENCOUNTER — Encounter: Payer: Self-pay | Admitting: Family Medicine

## 2020-08-26 ENCOUNTER — Other Ambulatory Visit: Payer: Self-pay | Admitting: Family Medicine

## 2020-08-26 ENCOUNTER — Encounter: Payer: Self-pay | Admitting: Family Medicine

## 2020-08-26 MED ORDER — PREDNISONE 10 MG PO TABS: ORAL_TABLET | ORAL | 0 refills | Status: DC

## 2020-09-17 ENCOUNTER — Telehealth: Payer: Self-pay | Admitting: Family Medicine

## 2020-09-17 NOTE — Telephone Encounter (Signed)
LVM for pt to rtn my call to r/s appt with nha on 10/07/20

## 2020-10-07 ENCOUNTER — Ambulatory Visit: Payer: Medicare Other

## 2020-10-09 ENCOUNTER — Ambulatory Visit (INDEPENDENT_AMBULATORY_CARE_PROVIDER_SITE_OTHER): Payer: Medicare Other

## 2020-10-09 DIAGNOSIS — Z0000 Encounter for general adult medical examination without abnormal findings: Secondary | ICD-10-CM

## 2020-10-09 NOTE — Progress Notes (Signed)
PCP notes:  Health Maintenance: No gaps noted   Abnormal Screenings: none   Patient concerns: none   Nurse concerns: none   Next PCP appt.: 10/17/2020 @ 11 am

## 2020-10-09 NOTE — Progress Notes (Signed)
Subjective:   Alejandro Schneider is a 70 y.o. male who presents for Medicare Annual/Subsequent preventive examination.  Review of Systems: N/A      I connected with the patient today by telephone and verified that I am speaking with the correct person using two identifiers. Location patient: home Location nurse: work Persons participating in the telephone visit: patient, nurse.   I discussed the limitations, risks, security and privacy concerns of performing an evaluation and management service by telephone and the availability of in person appointments. I also discussed with the patient that there may be a patient responsible charge related to this service. The patient expressed understanding and verbally consented to this telephonic visit.        Cardiac Risk Factors include: advanced age (>2men, >71 women);hypertension;male gender;Other (see comment), Risk factor comments: hyperlipidemia     Objective:    Today's Vitals   There is no height or weight on file to calculate BMI.  Advanced Directives 10/09/2020 09/27/2019 09/19/2018 09/19/2015 12/17/2014 10/28/2014 07/22/2014  Does Patient Have a Medical Advance Directive? Yes No No No No No No  Type of Paramedic of [place];Living will - - - - - -  Copy of [place] in Chart? No - copy requested - - - - - -  Would patient like information on creating a medical advance directive? - Yes (MAU/Ambulatory/Procedural Areas - Information given) No - Patient declined No - patient declined information Yes - Educational materials given No - patient declined information Yes - Educational materials given    Current Medications (verified) Outpatient Encounter Medications as of 10/09/2020  Medication Sig   amLODipine (NORVASC) 5 MG tablet Take 1 tablet (5 mg total) by mouth daily.   aspirin EC 325 MG tablet Take 1 tablet (325 mg total) by mouth daily.   clopidogrel (PLAVIX) 75 MG tablet TAKE 1 TABLET (75 MG  TOTAL) BY MOUTH DAILY.   clotrimazole-betamethasone (LOTRISONE) cream Apply 1 application topically 2 (two) times daily as needed.   Multiple Vitamins-Minerals (MULTIVITAMIN,TX-MINERALS) tablet Take 1 tablet by mouth daily.   Omega-3 Fatty Acids (FISH OIL) 1000 MG CAPS Take 1,000 mg by mouth daily.   rosuvastatin (CRESTOR) 10 MG tablet Take 1 tablet (10 mg total) by mouth daily.   predniSONE (DELTASONE) 10 MG tablet Take 2 a day for 5 days, then 1 a day for 5 days, with food. Don't take with aleve/ibuprofen. (Patient not taking: Reported on 10/09/2020)   No facility-administered encounter medications on file as of 10/09/2020.    Allergies (verified) Bee venom, Gabapentin, Lipitor [atorvastatin], and Simvastatin   History: Past Medical History:  Diagnosis Date   CVA (cerebral infarction)    Hyperlipidemia    Hypertension    Internal hemorrhoids    Stroke [facility])    Past Surgical History:  Procedure Laterality Date   COLONOSCOPY  2006   INGUINAL HERNIA REPAIR  10/16/02   Right  Dr Jeralyn Ruths LAMINECTOMY/DECOMPRESSION MICRODISCECTOMY Right 05/30/2012   Procedure: LUMBAR LAMINECTOMY/DECOMPRESSION MICRODISCECTOMY 1 LEVEL;  Surgeon: Faythe Ghee, MD;  Location: [facility];  Service: Neurosurgery;  Laterality: Right;   RADIOLOGY WITH ANESTHESIA N/A 05/21/2014   Procedure: ANGIOPLASTY POSSIBLE STENT;  Surgeon: Rob Hickman, MD;  Location: [facility];  Service: Radiology;  Laterality: N/A;   TONSILLECTOMY  1972   Family History  Problem Relation Age of Onset   Heart disease Father        CABGx4   Cancer Father  Prostate (Radiation)   Prostate cancer Father    Heart disease Paternal Grandfather        MI   Depression Mother    Stroke Maternal Grandmother    Prostate cancer Brother    Diabetes Cousin    Hypertension Neg Hx    Drug abuse Neg Hx    Alcohol abuse Neg Hx    Colon cancer Neg Hx    Esophageal cancer Neg Hx    Pancreatic cancer Neg Hx    Rectal cancer Neg  Hx    Stomach cancer Neg Hx    Social History   Socioeconomic History   Marital status: Married    Spouse name: Not on file   Number of children: 1   Years of education: Not on file   Highest education level: Not on file  Occupational History   Occupation: Lineman [employer]  Tobacco Use   Smoking status: Never   Smokeless tobacco: Never  Substance and Sexual Activity   Alcohol use: Yes    Alcohol/week: 2.0 standard drinks    Types: 2 Cans of beer per week   Drug use: No   Sexual activity: Yes  Other Topics Concern   Not on file  Social History Narrative   Married 1974   1 son, Engineer, agricultural (In [place]) [place]   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[employer] Strain: Low Risk    Difficulty of Paying Living Expenses: Not hard at [employer] Insecurity: No Food Insecurity   Worried About Charity fundraiser in the Last Year: Never true   Arboriculturist in the Last Year: Never true  Transportation Needs: No Data processing manager (Medical): No   Lack of Transportation (Non-Medical): No  Physical Activity: Sufficiently Active   Days of Exercise per Week: 7 days   Minutes of Exercise per Session: 30 min  Stress: No Stress Concern Present   Feeling of Stress : Not at all  Social Connections: Not on file    Tobacco Counseling Counseling given: Not Answered   Clinical Intake:  Pre-visit preparation completed: Yes  Pain : No/denies pain     Nutritional Risks: None Diabetes: No  How often do you need to have someone help you when you read instructions, pamphlets, or other written materials from your doctor or pharmacy?: 1 - Never  Diabetic: No Nutrition Risk Assessment:  Has the patient had any N/V/D within the last 2 months?  No  Does the patient have any non-healing wounds?  No  Has the patient had any unintentional weight loss or weight gain?  No   Diabetes:  Is the patient  diabetic?  No  If diabetic, was a CBG obtained today?   N/A Did the patient bring in their glucometer from home?   N/A How often do you monitor your CBG's? N/A.   Financial Strains and Diabetes Management:  Are you having any financial strains with the device, your supplies or your medication?  N/A .  Does the patient want to be seen by Chronic Care Management for management of their diabetes?   N/A Would the patient like to be referred to a Nutritionist or for Diabetic Management?   N/A    Interpreter Needed?: No  Information entered by :: CJohnson, LPN   Activities of Daily Living In your present state of health, do you have any difficulty performing  the following activities: 10/09/2020  Hearing? N  Vision? N  Difficulty concentrating or making decisions? N  Walking or climbing stairs? N  Dressing or bathing? N  Doing errands, shopping? N  Preparing Food and eating ? N  Using the Toilet? N  In the past six months, have you accidently leaked urine? N  Do you have problems with loss of bowel control? N  Managing your Medications? N  Managing your Finances? N  Housekeeping or managing your Housekeeping? N  Some recent data might be hidden    Patient Care Team: Tonia Ghent, MD as PCP - General (Family Medicine)  Indicate any recent Medical Services you may have received from other than [facility] providers in the past year (date may be approximate).     Assessment:   This is a routine wellness examination for Saeed.  Hearing/Vision screen Vision Screening - Comments:: Patient gets annual eye exams   Dietary issues and exercise activities discussed: Current Exercise Habits: Home exercise routine, Type of exercise: walking, Time (Minutes): 30, Frequency (Times/Week): 7, Weekly Exercise (Minutes/Week): 210, Intensity: Moderate, Exercise limited by: None identified   Goals Addressed             This Visit's Progress    Patient Stated       10/09/2020, I will continue  to walk everyday for 30 minutes.        Depression Screen PHQ 2/9 Scores 10/09/2020 09/27/2019 09/19/2018 10/19/2017 10/01/2016 12/17/2014 10/28/2014  PHQ - 2 Score 0 0 0 0 0 0 0  PHQ- 9 Score 0 0 0 - - - -    Fall Risk Fall Risk  10/09/2020 10/16/2019 09/27/2019 09/19/2018 10/19/2017  Falls in the past year? 0 0 0 0 No  Number falls in past yr: 0 0 0 - -  Injury with Fall? 0 0 0 - -  Risk for fall due to : Medication side effect - Medication side effect - -  Follow up Falls prevention discussed;Falls evaluation completed - Falls evaluation completed;Falls prevention discussed - -    FALL RISK PREVENTION PERTAINING TO THE HOME:  Any stairs in or around the home? Yes  If so, are there any without handrails? No  Home free of loose throw rugs in walkways, pet beds, electrical cords, etc? Yes  Adequate lighting in your home to reduce risk of falls? Yes   ASSISTIVE DEVICES UTILIZED TO PREVENT FALLS:  Life alert? No  Use of a cane, walker or w/c? No  Grab bars in the bathroom? No  Shower chair or bench in shower? No  Elevated toilet seat or a handicapped toilet? No   TIMED UP AND GO:  Was the test performed?  N/A telephone visit .   Cognitive Function: MMSE - Mini Mental State Exam 10/09/2020 09/27/2019 09/19/2018  Not completed: Refused Refused -  Orientation to time - - 5  Orientation to Place - - 5  Registration - - 3  Attention/ Calculation - - 0  Recall - - 3  Language- name 2 objects - - 0  Language- repeat - - 1  Language- follow 3 step command - - 0  Language- read & follow direction - - 0  Write a sentence - - 0  Copy design - - 0  Total score - - 17  Mini Cog  Mini-Cog screen was not completed. Patient wanted to skip this. Maximum score is 22. A value of 0 denotes this part of the MMSE was not completed or  the patient failed this part of the Mini-Cog screening.       Immunizations Immunization History  Administered Date(s) Administered   Influenza, High Dose Seasonal  PF 02/01/2017, 01/14/2020   Influenza-Unspecified 02/01/2014, 01/19/2019   Moderna Sars-Covid-2 Vaccination 04/10/2019, 05/07/2019, 02/05/2020   Pneumococcal Conjugate-13 10/01/2016   Pneumococcal Polysaccharide-23 10/19/2017   Td 04/05/2002   Tdap 09/05/2012   Zoster Recombinat (Shingrix) 01/14/2020, 07/11/2020   Zoster, Live 09/06/2013    TDAP status: Up to date  Flu Vaccine status: Up to date  Pneumococcal vaccine status: Up to date  Covid-19 vaccine status: Completed 3 vaccines  Qualifies for Shingles Vaccine? Yes   Zostavax completed Yes   Shingrix Completed?: Yes  Screening Tests Health Maintenance  Topic Date Due   COVID-19 Vaccine (4 - Booster for Moderna series) 05/07/2020   INFLUENZA VACCINE  11/03/2020   COLONOSCOPY (Pts 45-34yrs Insurance coverage will need to be confirmed)  12/14/2020   TETANUS/TDAP  09/06/2022   Hepatitis C Screening  Completed   PNA vac Low Risk Adult  Completed   Zoster Vaccines- Shingrix  Completed   HPV VACCINES  Aged Out    Health Maintenance  Health Maintenance Due  Topic Date Due   COVID-19 Vaccine (4 - Booster for Moderna series) 05/07/2020    Colorectal cancer screening: Type of screening: Colonoscopy. Completed 12/15/2015. Repeat every 5 years  Lung Cancer Screening: (Low Dose CT Chest recommended if Age 30-80 years, 30 pack-year currently smoking OR have quit w/in 15years.) does not qualify.   Additional Screening:  Hepatitis C Screening: does qualify; Completed 09/23/2015  Vision Screening: Recommended annual ophthalmology exams for early detection of glaucoma and other disorders of the eye. Is the patient up to date with their annual eye exam?  Yes  Who is the provider or what is the name of the office in which the patient attends annual eye exams? [facility] If pt is not established with a provider, would they like to be referred to a provider to establish care? No .   Dental Screening: Recommended annual dental  exams for proper oral hygiene  Community Resource Referral / Chronic Care Management: CRR required this visit?  No   CCM required this visit?  No      Plan:     I have personally reviewed and noted the following in the patient's chart:   Medical and social history Use of alcohol, tobacco or illicit drugs  Current medications and supplements including opioid prescriptions. Patient is not currently taking opioid prescriptions. Functional ability and status Nutritional status Physical activity Advanced directives List of other physicians Hospitalizations, surgeries, and ER visits in previous 12 months Vitals Screenings to include cognitive, depression, and falls Referrals and appointments  In addition, I have reviewed and discussed with patient certain preventive protocols, quality metrics, and best practice recommendations. A written personalized care plan for preventive services as well as general preventive health recommendations were provided to patient.   Due to this being a telephonic visit, the after visit summary with patients personalized plan was offered to patient via office or my-chart. Patient preferred to pick up at office at next visit or via mychart.   Andrez Grime, LPN   05/12/7822

## 2020-10-09 NOTE — Patient Instructions (Signed)
Alejandro Schneider , Thank you for taking time to come for your Medicare Wellness Visit. I appreciate your ongoing commitment to your health goals. Please review the following plan we discussed and let me know if I can assist you in the future.   Screening recommendations/referrals: Colonoscopy: Up to date, completed 12/15/2015, due 12/2020 Recommended yearly ophthalmology/optometry visit for glaucoma screening and checkup Recommended yearly dental visit for hygiene and checkup  Vaccinations: Influenza vaccine: Up to date, completed 01/14/2020, due 11/2020 Pneumococcal vaccine: Completed series Tdap vaccine: Up to date, completed 09/05/2012, due 09/2022 Shingles vaccine: Completed series   Covid-19: completed 3 vaccines   Advanced directives: Please bring a copy of your POA (Power of Attorney) and/or Living Will to your next appointment.   Conditions/risks identified: hypertension, hyperlipidemia   Next appointment: Follow up in one year for your annual wellness visit.   Preventive Care 70 Years and Older, Male Preventive care refers to lifestyle choices and visits with your health care provider that can promote health and wellness. What does preventive care include? A yearly physical exam. This is also called an annual well check. Dental exams once or twice a year. Routine eye exams. Ask your health care provider how often you should have your eyes checked. Personal lifestyle choices, including: Daily care of your teeth and gums. Regular physical activity. Eating a healthy diet. Avoiding tobacco and drug use. Limiting alcohol use. Practicing safe sex. Taking low doses of aspirin every day. Taking vitamin and mineral supplements as recommended by your health care provider. What happens during an annual well check? The services and screenings done by your health care provider during your annual well check will depend on your age, overall health, lifestyle risk factors, and family history of  disease. Counseling  Your health care provider may ask you questions about your: Alcohol use. Tobacco use. Drug use. Emotional well-being. Home and relationship well-being. Sexual activity. Eating habits. History of falls. Memory and ability to understand (cognition). Work and work Statistician. Screening  You may have the following tests or measurements: Height, weight, and BMI. Blood pressure. Lipid and cholesterol levels. These may be checked every 5 years, or more frequently if you are over 50 years old. Skin check. Lung cancer screening. You may have this screening every year starting at age 50 if you have a 30-pack-year history of smoking and currently smoke or have quit within the past 15 years. Fecal occult blood test (FOBT) of the stool. You may have this test every year starting at age 81. Flexible sigmoidoscopy or colonoscopy. You may have a sigmoidoscopy every 5 years or a colonoscopy every 10 years starting at age 35. Prostate cancer screening. Recommendations will vary depending on your family history and other risks. Hepatitis C blood test. Hepatitis B blood test. Sexually transmitted disease (STD) testing. Diabetes screening. This is done by checking your blood sugar (glucose) after you have not eaten for a while (fasting). You may have this done every 1-3 years. Abdominal aortic aneurysm (AAA) screening. You may need this if you are a current or former smoker. Osteoporosis. You may be screened starting at age 67 if you are at high risk. Talk with your health care provider about your test results, treatment options, and if necessary, the need for more tests. Vaccines  Your health care provider may recommend certain vaccines, such as: Influenza vaccine. This is recommended every year. Tetanus, diphtheria, and acellular pertussis (Tdap, Td) vaccine. You may need a Td booster every 10 years. Zoster vaccine.  You may need this after age 21. Pneumococcal 13-valent  conjugate (PCV13) vaccine. One dose is recommended after age 8. Pneumococcal polysaccharide (PPSV23) vaccine. One dose is recommended after age 45. Talk to your health care provider about which screenings and vaccines you need and how often you need them. This information is not intended to replace advice given to you by your health care provider. Make sure you discuss any questions you have with your health care provider. Document Released: 04/18/2015 Document Revised: 12/10/2015 Document Reviewed: 01/21/2015 Elsevier Interactive Patient Education  2017 [employer] Prevention in the Home Falls can cause injuries. They can happen to people of all ages. There are many things you can do to make your home safe and to help prevent falls. What can I do on the outside of my home? Regularly fix the edges of walkways and driveways and fix any cracks. Remove anything that might make you trip as you walk through a door, such as a raised step or threshold. Trim any bushes or trees on the path to your home. Use bright outdoor lighting. Clear any walking paths of anything that might make someone trip, such as rocks or tools. Regularly check to see if handrails are loose or broken. Make sure that both sides of any steps have handrails. Any raised decks and porches should have guardrails on the edges. Have any leaves, snow, or ice cleared regularly. Use sand or salt on walking paths during winter. Clean up any spills in your garage right away. This includes oil or grease spills. What can I do in the bathroom? Use night lights. Install grab bars by the toilet and in the tub and shower. Do not use towel bars as grab bars. Use non-skid mats or decals in the tub or shower. If you need to sit down in the shower, use a plastic, non-slip stool. Keep the floor dry. Clean up any water that spills on the floor as soon as it happens. Remove soap buildup in the tub or shower regularly. Attach bath mats  securely with double-sided non-slip rug tape. Do not have throw rugs and other things on the floor that can make you trip. What can I do in the bedroom? Use night lights. Make sure that you have a light by your bed that is easy to reach. Do not use any sheets or blankets that are too big for your bed. They should not hang down onto the floor. Have a firm chair that has side arms. You can use this for support while you get dressed. Do not have throw rugs and other things on the floor that can make you trip. What can I do in the kitchen? Clean up any spills right away. Avoid walking on wet floors. Keep items that you use a lot in easy-to-reach places. If you need to reach something above you, use a strong step stool that has a grab bar. Keep electrical cords out of the way. Do not use floor polish or wax that makes floors slippery. If you must use wax, use non-skid floor wax. Do not have throw rugs and other things on the floor that can make you trip. What can I do with my stairs? Do not leave any items on the stairs. Make sure that there are handrails on both sides of the stairs and use them. Fix handrails that are broken or loose. Make sure that handrails are as long as the stairways. Check any carpeting to make sure that it is  firmly attached to the stairs. Fix any carpet that is loose or worn. Avoid having throw rugs at the top or bottom of the stairs. If you do have throw rugs, attach them to the floor with carpet tape. Make sure that you have a light switch at the top of the stairs and the bottom of the stairs. If you do not have them, ask someone to add them for you. What else can I do to help prevent falls? Wear shoes that: Do not have high heels. Have rubber bottoms. Are comfortable and fit you well. Are closed at the toe. Do not wear sandals. If you use a stepladder: Make sure that it is fully opened. Do not climb a closed stepladder. Make sure that both sides of the stepladder  are locked into place. Ask someone to hold it for you, if possible. Clearly mark and make sure that you can see: Any grab bars or handrails. First and last steps. Where the edge of each step is. Use tools that help you move around (mobility aids) if they are needed. These include: Canes. Walkers. Scooters. Crutches. Turn on the lights when you go into a dark area. Replace any light bulbs as soon as they burn out. Set up your furniture so you have a clear path. Avoid moving your furniture around. If any of your floors are uneven, fix them. If there are any pets around you, be aware of where they are. Review your medicines with your doctor. Some medicines can make you feel dizzy. This can increase your chance of falling. Ask your doctor what other things that you can do to help prevent falls. This information is not intended to replace advice given to you by your health care provider. Make sure you discuss any questions you have with your health care provider. Document Released: 01/16/2009 Document Revised: 08/28/2015 Document Reviewed: 04/26/2014 Elsevier Interactive Patient Education  2017 [employer].

## 2020-10-12 ENCOUNTER — Other Ambulatory Visit: Payer: Self-pay | Admitting: Family Medicine

## 2020-10-12 DIAGNOSIS — Z125 Encounter for screening for malignant neoplasm of prostate: Secondary | ICD-10-CM

## 2020-10-12 DIAGNOSIS — I10 Essential (primary) hypertension: Secondary | ICD-10-CM

## 2020-10-12 DIAGNOSIS — R739 Hyperglycemia, unspecified: Secondary | ICD-10-CM

## 2020-10-14 ENCOUNTER — Other Ambulatory Visit: Payer: Self-pay

## 2020-10-14 ENCOUNTER — Other Ambulatory Visit (INDEPENDENT_AMBULATORY_CARE_PROVIDER_SITE_OTHER): Payer: Medicare Other

## 2020-10-14 DIAGNOSIS — I10 Essential (primary) hypertension: Secondary | ICD-10-CM | POA: Diagnosis not present

## 2020-10-14 DIAGNOSIS — Z125 Encounter for screening for malignant neoplasm of prostate: Secondary | ICD-10-CM

## 2020-10-14 DIAGNOSIS — R739 Hyperglycemia, unspecified: Secondary | ICD-10-CM | POA: Diagnosis not present

## 2020-10-14 LAB — LIPID PANEL
Cholesterol: 144 mg/dL (ref 0–200)
HDL: 43.2 mg/dL (ref >39.00)
LDL Cholesterol: 76 mg/dL (ref 0–99)
NonHDL: 100.66
Total CHOL/HDL Ratio: 3
Triglycerides: 121 mg/dL (ref 0.0–149.0)
VLDL: 24.2 mg/dL (ref 0.0–40.0)

## 2020-10-14 LAB — COMPREHENSIVE METABOLIC PANEL
ALT: 18 U/L (ref 0–53)
AST: 16 U/L (ref 0–37)
Albumin: 4.4 g/dL (ref 3.5–5.2)
Alkaline Phosphatase: 67 U/L (ref 39–117)
BUN: 14 mg/dL (ref 6–23)
CO2: 26 mEq/L (ref 19–32)
Calcium: 9 mg/dL (ref 8.4–10.5)
Chloride: 103 mEq/L (ref 96–112)
Creatinine, Ser: 0.77 mg/dL (ref 0.40–1.50)
GFR: 91.17 mL/min (ref >60.00)
Glucose, Bld: 109 mg/dL — ABNORMAL HIGH (ref 70–99)
Potassium: 4.2 mEq/L (ref 3.5–5.1)
Sodium: 137 mEq/L (ref 135–145)
Total Bilirubin: 1 mg/dL (ref 0.2–1.2)
Total Protein: 6.5 g/dL (ref 6.0–8.3)

## 2020-10-14 LAB — PSA, MEDICARE: PSA: 5.95 ng/ml — ABNORMAL HIGH (ref 0.10–4.00)

## 2020-10-14 LAB — HEMOGLOBIN A1C: Hgb A1c MFr Bld: 5.8 % (ref 4.6–6.5)

## 2020-10-17 ENCOUNTER — Other Ambulatory Visit: Payer: Self-pay

## 2020-10-17 ENCOUNTER — Ambulatory Visit (INDEPENDENT_AMBULATORY_CARE_PROVIDER_SITE_OTHER): Payer: Medicare Other | Admitting: Family Medicine

## 2020-10-17 ENCOUNTER — Encounter: Payer: Self-pay | Admitting: Family Medicine

## 2020-10-17 VITALS — BP 160/78 | HR 97 | Temp 97.0°F | Ht 69.0 in | Wt 210.2 lb

## 2020-10-17 DIAGNOSIS — E785 Hyperlipidemia, unspecified: Secondary | ICD-10-CM

## 2020-10-17 DIAGNOSIS — R972 Elevated prostate specific antigen [PSA]: Secondary | ICD-10-CM | POA: Diagnosis not present

## 2020-10-17 DIAGNOSIS — M79606 Pain in leg, unspecified: Secondary | ICD-10-CM

## 2020-10-17 DIAGNOSIS — I10 Essential (primary) hypertension: Secondary | ICD-10-CM

## 2020-10-17 NOTE — Progress Notes (Signed)
This visit occurred during the SARS-CoV-2 public health emergency.  Safety protocols were in place, including screening questions prior to the visit, additional usage of staff PPE, and extensive cleaning of exam room while observing appropriate contact time as indicated for disinfecting solutions.  PSA elevation d/w pt.  FH prostate cancer.  D/w pt about referral to urology.  Order placed.  CVA hx d/w pt.  See avs.  No new events.  Continues on amlodipine aspirin Plavix and statin in the meantime.  Still with leg pain.  Prednisone didn't help with 2nd course.  Right leg pain/right knee pain.  GI should call pt this fall.  See after visit summary.  Discussed.  Elevated Cholesterol: Using medications without problems: yes Muscle aches: "not that I know of" but he has some R knee and L hip pain, more with prolonged riding in the car.   D/w pt.   Diet compliance: yes Exercise: yes  Hypertension:    Using medication without problems or lightheadedness: yes Chest pain with exertion:no Edema:no Short of breath:no Average home BPs: variable BPs noted by patient.   He was admittedly worried about PSA.    Meds, vitals, and allergies reviewed.   PMH and SH reviewed  ROS: Per HPI unless specifically indicated in ROS section   GEN: nad, alert and oriented HEENT: ncat NECK: supple w/o LA CV: rrr. PULM: ctab, no inc wob ABD: soft, +bs EXT: no edema SKIN: no acute rash Right straight leg raise negative.  Able to bear weight.

## 2020-10-17 NOTE — Patient Instructions (Signed)
GI should call you this fall.  Radiology should call you soon.   Let me know if you need help getting scheduled.    We'll call about seeing urology.

## 2020-10-19 DIAGNOSIS — R972 Elevated prostate specific antigen [PSA]: Secondary | ICD-10-CM | POA: Insufficient documentation

## 2020-10-19 NOTE — Assessment & Plan Note (Signed)
Continue Crestor for now.  Labs discussed with patient.

## 2020-10-19 NOTE — Assessment & Plan Note (Signed)
Refer to urology.  PSA discussed with patient.  He agrees with plan.

## 2020-10-19 NOTE — Assessment & Plan Note (Signed)
Continue amlodipine.  He is admittedly worried about his PSA today.  He can check his blood pressure at home and let me know if it stays elevated.

## 2020-10-19 NOTE — Assessment & Plan Note (Signed)
Prednisone did not help, with the second course.  Discussed patient about options.  X-rays down at our clinic.  We will see about getting L-spine and right knee films set up.

## 2020-10-28 ENCOUNTER — Ambulatory Visit
Admission: RE | Admit: 2020-10-28 | Discharge: 2020-10-28 | Disposition: A | Discharge status: Home / Self Care | Payer: Medicare Other | Source: Ambulatory Visit | Attending: Family Medicine | Admitting: Family Medicine

## 2020-10-28 DIAGNOSIS — M545 Low back pain, unspecified: Secondary | ICD-10-CM | POA: Diagnosis not present

## 2020-10-28 DIAGNOSIS — M79604 Pain in right leg: Secondary | ICD-10-CM | POA: Diagnosis not present

## 2020-10-28 DIAGNOSIS — M79606 Pain in leg, unspecified: Secondary | ICD-10-CM

## 2020-11-04 ENCOUNTER — Other Ambulatory Visit (HOSPITAL_COMMUNITY): Payer: Self-pay | Admitting: Interventional Radiology

## 2020-11-04 ENCOUNTER — Telehealth (HOSPITAL_COMMUNITY): Payer: Self-pay

## 2020-11-04 DIAGNOSIS — I671 Cerebral aneurysm, nonruptured: Secondary | ICD-10-CM

## 2020-11-04 NOTE — Telephone Encounter (Signed)
Called to schedule mra, no answer, left vm. AW  ?

## 2020-11-05 ENCOUNTER — Other Ambulatory Visit: Payer: Self-pay | Admitting: Family Medicine

## 2020-11-05 DIAGNOSIS — M79606 Pain in leg, unspecified: Secondary | ICD-10-CM

## 2020-11-19 ENCOUNTER — Other Ambulatory Visit: Payer: Self-pay

## 2020-11-19 ENCOUNTER — Ambulatory Visit (HOSPITAL_COMMUNITY)
Admission: RE | Admit: 2020-11-19 | Discharge: 2020-11-19 | Disposition: A | Discharge status: Home / Self Care | Payer: Medicare Other | Source: Ambulatory Visit | Attending: Interventional Radiology | Admitting: Interventional Radiology

## 2020-11-19 DIAGNOSIS — I671 Cerebral aneurysm, nonruptured: Secondary | ICD-10-CM | POA: Diagnosis not present

## 2020-11-19 DIAGNOSIS — I6501 Occlusion and stenosis of right vertebral artery: Secondary | ICD-10-CM | POA: Diagnosis not present

## 2020-11-25 ENCOUNTER — Encounter: Payer: Self-pay | Admitting: *Deleted

## 2020-11-26 ENCOUNTER — Telehealth (HOSPITAL_COMMUNITY): Payer: Self-pay

## 2020-11-26 NOTE — Telephone Encounter (Signed)
Called pt regarding recent imaging, no answer, left vm. AW  

## 2020-11-27 ENCOUNTER — Telehealth (HOSPITAL_COMMUNITY): Payer: Self-pay

## 2020-11-27 DIAGNOSIS — R972 Elevated prostate specific antigen [PSA]: Secondary | ICD-10-CM | POA: Diagnosis not present

## 2020-11-27 DIAGNOSIS — N400 Enlarged prostate without lower urinary tract symptoms: Secondary | ICD-10-CM | POA: Diagnosis not present

## 2020-11-27 NOTE — Telephone Encounter (Signed)
Returned pt's call, no answer, left vm. AW  

## 2020-12-02 ENCOUNTER — Telehealth (HOSPITAL_COMMUNITY): Payer: Self-pay

## 2020-12-02 NOTE — Telephone Encounter (Signed)
Pt agreed to f/u in 6 months with mra. AW  

## 2020-12-09 ENCOUNTER — Encounter: Payer: Self-pay | Admitting: Gastroenterology

## 2020-12-16 DIAGNOSIS — M1711 Unilateral primary osteoarthritis, right knee: Secondary | ICD-10-CM | POA: Diagnosis not present

## 2021-01-09 ENCOUNTER — Other Ambulatory Visit: Payer: Self-pay | Admitting: Family Medicine

## 2021-01-13 DIAGNOSIS — Z23 Encounter for immunization: Secondary | ICD-10-CM | POA: Diagnosis not present

## 2021-01-28 DIAGNOSIS — Z23 Encounter for immunization: Secondary | ICD-10-CM | POA: Diagnosis not present

## 2021-03-04 DIAGNOSIS — M13861 Other specified arthritis, right knee: Secondary | ICD-10-CM | POA: Diagnosis not present

## 2021-03-04 DIAGNOSIS — M25561 Pain in right knee: Secondary | ICD-10-CM | POA: Diagnosis not present

## 2021-03-13 DIAGNOSIS — M25561 Pain in right knee: Secondary | ICD-10-CM | POA: Diagnosis not present

## 2021-03-19 DIAGNOSIS — S83231D Complex tear of medial meniscus, current injury, right knee, subsequent encounter: Secondary | ICD-10-CM | POA: Diagnosis not present

## 2021-03-19 DIAGNOSIS — S83281A Other tear of lateral meniscus, current injury, right knee, initial encounter: Secondary | ICD-10-CM | POA: Diagnosis not present

## 2021-04-13 ENCOUNTER — Other Ambulatory Visit: Payer: Self-pay

## 2021-04-13 ENCOUNTER — Encounter: Payer: Self-pay | Admitting: Family Medicine

## 2021-04-13 ENCOUNTER — Ambulatory Visit (INDEPENDENT_AMBULATORY_CARE_PROVIDER_SITE_OTHER): Payer: Medicare Other | Admitting: Family Medicine

## 2021-04-13 DIAGNOSIS — M79606 Pain in leg, unspecified: Secondary | ICD-10-CM

## 2021-04-13 NOTE — Patient Instructions (Signed)
Let me see about the timing to stop aspirin and plavix and we'll be in touch.  Assuming no other complicating factors, you should be appropriately low risk for surgery.  Take care.  Glad to see you.

## 2021-04-13 NOTE — Progress Notes (Signed)
bThis visit occurred during the SARS-CoV-2 public health emergency.  Safety protocols were in place, including screening questions prior to the visit, additional usage of staff PPE, and extensive cleaning of exam room while observing appropriate contact time as indicated for disinfecting solutions.  Planned knee scope, R knee.  Not a full replacement. He has sig R knee pain and this may help.  He had prev injections w/o sig relief.  No CP, not SOB.  He is limited by his knee pain.    D/w pt about that I want to check with Dr. Estanislado Pandy about aspirin and plavix and duration to hold either.  The main consideration going into his surgery is his antiplatelet agents and the previous indication for them.  We talked about the risks and benefits of surgery, and his situation overall.  Meds, vitals, and allergies reviewed.   ROS: Per HPI unless specifically indicated in ROS section   GEN: nad, alert and oriented HEENT: ncat NECK: supple w/o LA CV: rrr.   PULM: ctab, no inc wob ABD: soft, +bs EXT: no edema SKIN: no acute rash

## 2021-04-16 ENCOUNTER — Telehealth: Payer: Self-pay | Admitting: Family Medicine

## 2021-04-16 NOTE — Telephone Encounter (Signed)
Opened in error

## 2021-04-16 NOTE — Telephone Encounter (Signed)
I wanted to check with you prior to patient's procedure.  He has a knee scope pending.  It likely makes sense to hold aspirin and plavix for 5 days, but I wanted to make sure you were in agreement.  Please let me know.  Thanks.

## 2021-04-16 NOTE — Assessment & Plan Note (Signed)
With plan for right knee scope. D/w pt about that I want to check with Dr. Estanislado Pandy about aspirin and plavix and duration to hold either.  Assuming we can clarify his plan regarding his antiplatelet agents, it would appear that he is appropriately low risk for surgery.  We talked about the fact that I cannot "clear" a patient for surgery, but given his situation it would seem to be appropriate/reasonable/indicated to go ahead with surgery.

## 2021-04-17 MED ORDER — ASPIRIN 81 MG PO TBEC: 81.0000 mg | DELAYED_RELEASE_TABLET | Freq: Every day | ORAL | Status: DC

## 2021-04-17 NOTE — Telephone Encounter (Addendum)
Received: Alejandro Schneider, Armando Gang, PA-C  Tonia Ghent, MD Dr. Damita Dunnings,   [name], one of the APPs who work with Dr. Estanislado Pandy.  I reviewed this patient with Dr. 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  He says that ASA dose can be changed to 81 mg indefinitely, and he recommends patient to be on Plavix as soon as he can after the knee surgery.  Please let me know if you have questions and concerns.   ===================================== See above.  Please notify pt.  Would change aspirin to 81mg  daily and continue before/during/after the surgery.  Hold plavix x5 doses prior to surgery but restart as soon as possible after the surgery.  I will update his papers for ortho.  Thanks.

## 2021-04-17 NOTE — Telephone Encounter (Signed)
Spoke with patient and notified to change aspirin dose to 81 mg QD now and to hold plavix x 5 doses prior to surgery. Form has been faxed to ortho.

## 2021-05-13 DIAGNOSIS — Y999 Unspecified external cause status: Secondary | ICD-10-CM | POA: Diagnosis not present

## 2021-05-13 DIAGNOSIS — M94261 Chondromalacia, right knee: Secondary | ICD-10-CM | POA: Diagnosis not present

## 2021-05-13 DIAGNOSIS — S83271A Complex tear of lateral meniscus, current injury, right knee, initial encounter: Secondary | ICD-10-CM | POA: Diagnosis not present

## 2021-05-13 DIAGNOSIS — X58XXXA Exposure to other specified factors, initial encounter: Secondary | ICD-10-CM | POA: Diagnosis not present

## 2021-05-13 DIAGNOSIS — S83231A Complex tear of medial meniscus, current injury, right knee, initial encounter: Secondary | ICD-10-CM | POA: Diagnosis not present

## 2021-05-13 DIAGNOSIS — G8918 Other acute postprocedural pain: Secondary | ICD-10-CM | POA: Diagnosis not present

## 2021-05-27 DIAGNOSIS — Z125 Encounter for screening for malignant neoplasm of prostate: Secondary | ICD-10-CM | POA: Diagnosis not present

## 2021-06-03 DIAGNOSIS — N400 Enlarged prostate without lower urinary tract symptoms: Secondary | ICD-10-CM | POA: Diagnosis not present

## 2021-06-03 DIAGNOSIS — R972 Elevated prostate specific antigen [PSA]: Secondary | ICD-10-CM | POA: Diagnosis not present

## 2021-06-24 DIAGNOSIS — Z4789 Encounter for other orthopedic aftercare: Secondary | ICD-10-CM | POA: Diagnosis not present

## 2021-07-28 DIAGNOSIS — M2241 Chondromalacia patellae, right knee: Secondary | ICD-10-CM | POA: Insufficient documentation

## 2021-08-04 DIAGNOSIS — M25561 Pain in right knee: Secondary | ICD-10-CM | POA: Diagnosis not present

## 2021-08-11 DIAGNOSIS — M1992 Post-traumatic osteoarthritis, unspecified site: Secondary | ICD-10-CM | POA: Insufficient documentation

## 2021-09-03 ENCOUNTER — Telehealth (HOSPITAL_COMMUNITY): Payer: Self-pay

## 2021-09-03 NOTE — Telephone Encounter (Signed)
Called to schedule mra, no answer, left vm. AW  ?

## 2021-09-07 ENCOUNTER — Other Ambulatory Visit (HOSPITAL_COMMUNITY): Payer: Self-pay | Admitting: Interventional Radiology

## 2021-09-07 DIAGNOSIS — I671 Cerebral aneurysm, nonruptured: Secondary | ICD-10-CM

## 2021-09-22 DIAGNOSIS — M25561 Pain in right knee: Secondary | ICD-10-CM | POA: Diagnosis not present

## 2021-10-12 ENCOUNTER — Ambulatory Visit: Payer: Medicare Other

## 2021-10-12 ENCOUNTER — Other Ambulatory Visit: Payer: Self-pay | Admitting: Family Medicine

## 2021-10-12 ENCOUNTER — Other Ambulatory Visit: Payer: Self-pay

## 2021-10-12 DIAGNOSIS — E785 Hyperlipidemia, unspecified: Secondary | ICD-10-CM

## 2021-10-12 DIAGNOSIS — R739 Hyperglycemia, unspecified: Secondary | ICD-10-CM

## 2021-10-12 DIAGNOSIS — Z125 Encounter for screening for malignant neoplasm of prostate: Secondary | ICD-10-CM

## 2021-10-13 ENCOUNTER — Other Ambulatory Visit (INDEPENDENT_AMBULATORY_CARE_PROVIDER_SITE_OTHER): Payer: Medicare Other

## 2021-10-13 ENCOUNTER — Ambulatory Visit (INDEPENDENT_AMBULATORY_CARE_PROVIDER_SITE_OTHER): Payer: Medicare Other

## 2021-10-13 ENCOUNTER — Other Ambulatory Visit: Payer: Self-pay | Admitting: Family Medicine

## 2021-10-13 VITALS — Ht 69.0 in | Wt 219.0 lb

## 2021-10-13 DIAGNOSIS — E785 Hyperlipidemia, unspecified: Secondary | ICD-10-CM | POA: Diagnosis not present

## 2021-10-13 DIAGNOSIS — Z125 Encounter for screening for malignant neoplasm of prostate: Secondary | ICD-10-CM | POA: Diagnosis not present

## 2021-10-13 DIAGNOSIS — Z0000 Encounter for general adult medical examination without abnormal findings: Secondary | ICD-10-CM | POA: Diagnosis not present

## 2021-10-13 DIAGNOSIS — R5383 Other fatigue: Secondary | ICD-10-CM

## 2021-10-13 DIAGNOSIS — R739 Hyperglycemia, unspecified: Secondary | ICD-10-CM | POA: Diagnosis not present

## 2021-10-13 LAB — COMPREHENSIVE METABOLIC PANEL
ALT: 23 U/L (ref 0–53)
AST: 18 U/L (ref 0–37)
Albumin: 4.5 g/dL (ref 3.5–5.2)
Alkaline Phosphatase: 71 U/L (ref 39–117)
BUN: 18 mg/dL (ref 6–23)
CO2: 26 mEq/L (ref 19–32)
Calcium: 9.2 mg/dL (ref 8.4–10.5)
Chloride: 104 mEq/L (ref 96–112)
Creatinine, Ser: 0.83 mg/dL (ref 0.40–1.50)
GFR: 88.51 mL/min (ref >60.00)
Glucose, Bld: 115 mg/dL — ABNORMAL HIGH (ref 70–99)
Potassium: 4.1 mEq/L (ref 3.5–5.1)
Sodium: 139 mEq/L (ref 135–145)
Total Bilirubin: 0.9 mg/dL (ref 0.2–1.2)
Total Protein: 6.5 g/dL (ref 6.0–8.3)

## 2021-10-13 LAB — LIPID PANEL
Cholesterol: 154 mg/dL (ref 0–200)
HDL: 47.7 mg/dL (ref >39.00)
LDL Cholesterol: 86 mg/dL (ref 0–99)
NonHDL: 105.88
Total CHOL/HDL Ratio: 3
Triglycerides: 97 mg/dL (ref 0.0–149.0)
VLDL: 19.4 mg/dL (ref 0.0–40.0)

## 2021-10-13 LAB — CBC WITH DIFFERENTIAL/PLATELET
Basophils Absolute: 0 10*3/uL (ref 0.0–0.1)
Basophils Relative: 0.6 % (ref 0.0–3.0)
Eosinophils Absolute: 0.3 10*3/uL (ref 0.0–0.7)
Eosinophils Relative: 3.5 % (ref 0.0–5.0)
HCT: 44.1 % (ref 39.0–52.0)
Hemoglobin: 14.9 g/dL (ref 13.0–17.0)
Lymphocytes Relative: 14.4 % (ref 12.0–46.0)
Lymphs Abs: 1.1 10*3/uL (ref 0.7–4.0)
MCHC: 33.7 g/dL (ref 30.0–36.0)
MCV: 91.5 fl (ref 78.0–100.0)
Monocytes Absolute: 0.8 10*3/uL (ref 0.1–1.0)
Monocytes Relative: 10.5 % (ref 3.0–12.0)
Neutro Abs: 5.2 10*3/uL (ref 1.4–7.7)
Neutrophils Relative %: 71 % (ref 43.0–77.0)
Platelets: 153 10*3/uL (ref 150.0–400.0)
RBC: 4.82 Mil/uL (ref 4.22–5.81)
RDW: 14.2 % (ref 11.5–15.5)
WBC: 7.3 10*3/uL (ref 4.0–10.5)

## 2021-10-13 LAB — TSH: TSH: 3.33 u[IU]/mL (ref 0.35–5.50)

## 2021-10-13 LAB — HEMOGLOBIN A1C: Hgb A1c MFr Bld: 5.9 % (ref 4.6–6.5)

## 2021-10-13 LAB — PSA, MEDICARE: PSA: 2.61 ng/ml (ref 0.10–4.00)

## 2021-10-13 LAB — TESTOSTERONE: Testosterone: 173.25 ng/dL — ABNORMAL LOW (ref 300.00–890.00)

## 2021-10-13 NOTE — Progress Notes (Signed)
Virtual Visit via Telephone Note  I connected with  Alejandro Schneider on 10/13/21 at  3:00 PM EDT by telephone and verified that I am speaking with the correct person using two identifiers.  Location: Patient: home Provider: [place] Persons participating in the virtual visit: [place]   I discussed the limitations, risks, security and privacy concerns of performing an evaluation and management service by telephone and the availability of in person appointments. The patient expressed understanding and agreed to proceed.  Interactive audio and video telecommunications were attempted between this nurse and patient, however failed, due to patient having technical difficulties OR patient did not have access to video capability.  We continued and completed visit with audio only.  Some vital signs may be absent or patient reported.   Dionisio David, LPN  Subjective:   VERDELL Schneider is a 71 y.o. male who presents for Medicare Annual/Subsequent preventive examination.  Review of Systems     Cardiac Risk Factors include: advanced age (>71mn, >>89women);hypertension;male gender     Objective:    There were no vitals filed for this visit. There is no height or weight on file to calculate BMI.     10/13/2021    3:07 PM 10/09/2020    9:47 AM 09/27/2019    3:35 PM 09/19/2018    1:08 PM 09/19/2015    7:21 AM 12/17/2014    9:51 AM 10/28/2014   11:14 AM  Advanced Directives  Does Patient Have a Medical Advance Directive? No Yes No No No No No  Type of ASocial research officer, governmentLiving will       Copy of [place] Chart?  No - copy requested       Would patient like information on creating a medical advance directive? No - Patient declined  Yes (MAU/Ambulatory/Procedural Areas - Information given) No - Patient declined No - patient declined information Yes - Educational materials given No - patient declined information     Current Medications (verified) Outpatient Encounter Medications as of 10/13/2021  Medication Sig   amLODipine (NORVASC) 5 MG tablet TAKE ONE TABLET BY MOUTH ONE TIME DAILY   aspirin 81 MG chewable tablet CHEW ONE TABLET BY MOUTH TWICE A DAY FOR 30 DAYS   clopidogrel (PLAVIX) 75 MG tablet TAKE ONE TABLET BY MOUTH ONE TIME DAILY   clotrimazole-betamethasone (LOTRISONE) cream Apply 1 application topically 2 (two) times daily as needed.   Multiple Vitamins-Minerals (MULTIVITAMIN,TX-MINERALS) tablet Take 1 tablet by mouth daily.   Omega-3 Fatty Acids (FISH OIL) 1000 MG CAPS Take 1,000 mg by mouth daily.   rosuvastatin (CRESTOR) 10 MG tablet TAKE ONE TABLET BY MOUTH ONE TIME DAILY   methocarbamol (ROBAXIN) 500 MG tablet methocarbamol 500 mg tablet  TAKE ONE TABLET BY MOUTH EVERY 6 TO 8 HOURS AS NEEDED FOR SPASM FOR 10 DAYS (Patient not taking: Reported on 10/13/2021)   ondansetron (ZOFRAN) 8 MG tablet ondansetron HCl 8 mg tablet  TAKE ONE TABLET BY MOUTH EVERY 8 HOURS AS NEEDED FOR NAUSEA FOR 5 DAYS (Patient not taking: Reported on 10/13/2021)   oxyCODONE-acetaminophen (PERCOCET/ROXICET) 5-325 MG tablet Take by mouth. (Patient not taking: Reported on 10/13/2021)   oxyCODONE-acetaminophen (PERCOCET/ROXICET) 5-325 MG tablet oxycodone-acetaminophen 5 mg-325 mg tablet  TAKE ONE TO TWO TABLETS BY MOUTH EVERY 4 TO 6 HOURS AS NEEDED FOR PAIN, DO NOT EXCEED 6 TABLETS IN A DAY (Patient not taking: Reported on 10/13/2021)   [DISCONTINUED] amLODipine (NORVASC)  5 MG tablet Take 1 tablet by mouth daily.   [DISCONTINUED] aspirin 81 MG EC tablet Take 1 tablet (81 mg total) by mouth daily.   [DISCONTINUED] clopidogrel (PLAVIX) 75 MG tablet Take 1 tablet by mouth daily.   No facility-administered encounter medications on file as of 10/13/2021.    Allergies (verified) Bee venom, Gabapentin, Lipitor [atorvastatin], and Simvastatin   History: Past Medical History:  Diagnosis Date   CVA (cerebral infarction)     Hyperlipidemia    Hypertension    Internal hemorrhoids    Stroke [facility])    Past Surgical History:  Procedure Laterality Date   COLONOSCOPY  2006   INGUINAL HERNIA REPAIR  10/16/02   Right  Dr Jeralyn Ruths LAMINECTOMY/DECOMPRESSION MICRODISCECTOMY Right 05/30/2012   Procedure: LUMBAR LAMINECTOMY/DECOMPRESSION MICRODISCECTOMY 1 LEVEL;  Surgeon: Faythe Ghee, MD;  Location: [facility];  Service: Neurosurgery;  Laterality: Right;   RADIOLOGY WITH ANESTHESIA N/A 05/21/2014   Procedure: ANGIOPLASTY POSSIBLE STENT;  Surgeon: Rob Hickman, MD;  Location: [facility];  Service: Radiology;  Laterality: N/A;   TONSILLECTOMY  1972   Family History  Problem Relation Age of Onset   Heart disease Father        CABGx4   Cancer Father        Prostate (Radiation)   Prostate cancer Father    Heart disease Paternal Grandfather        MI   Depression Mother    Stroke Maternal Grandmother    Prostate cancer Brother    Diabetes Cousin    Hypertension Neg Hx    Drug abuse Neg Hx    Alcohol abuse Neg Hx    Colon cancer Neg Hx    Esophageal cancer Neg Hx    Pancreatic cancer Neg Hx    Rectal cancer Neg Hx    Stomach cancer Neg Hx    Social History   Socioeconomic History   Marital status: Married    Spouse name: Not on file   Number of children: 1   Years of education: Not on file   Highest education level: Not on file  Occupational History   Occupation: Lineman [employer]  Tobacco Use   Smoking status: Never   Smokeless tobacco: Never  Substance and Sexual Activity   Alcohol use: Not Currently   Drug use: No   Sexual activity: Yes  Other Topics Concern   Not on file  Social History Narrative   Married 1974   1 son, Engineer, agricultural (In [place]) [place]   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[place] [employer] Strain: [place]  (10/13/2021)   Overall Financial Resource Strain (CARDIA)    Difficulty of Paying Living Expenses: Not  hard at all  Food Insecurity: No Food Insecurity (10/13/2021)   Hunger Vital Sign    Worried About Running Out of Food in the Last Year: Never true    [place] in the Last Year: Never true  Transportation Needs: No Transportation Needs (10/13/2021)   PRAPARE - Hydrologist (Medical): No    Lack of Transportation (Non-Medical): No  Physical Activity: Insufficiently Active (10/13/2021)   Exercise Vital Sign    Days of Exercise per Week: 3 days    Minutes of Exercise per Session: 30 min  Stress: No Stress Concern Present (10/13/2021)   [place]  Feeling of Stress : Not at all  Social Connections: Moderately Integrated (10/13/2021)   Social Connection and Isolation Panel [NHANES]    Frequency of Communication with Friends and Family: More than three times a week    Frequency of Social Gatherings with Friends and Family: Once a week    Attends Religious Services: More than 4 times per year    Active Member of [employer] or Organizations: No    Attends Music therapist: Never    Marital Status: Married    Tobacco Counseling Counseling given: Not Answered   Clinical Intake:  Pre-visit preparation completed: Yes  Pain : No/denies pain     Nutritional Risks: None Diabetes: No  How often do you need to have someone help you when you read instructions, pamphlets, or other written materials from your doctor or pharmacy?: 1 - Never  Diabetic?no  Interpreter Needed?: No  Information entered by :: Kirke Shaggy, LPN   Activities of Daily Living    10/13/2021    3:08 PM  In your present state of health, do you have any difficulty performing the following activities:  Hearing? 0  Vision? 0  Difficulty concentrating or making decisions? 0  Walking or climbing stairs? 0  Dressing or bathing? 0  Doing errands, shopping? 0  Preparing Food and eating ? N  Using the  Toilet? N  In the past six months, have you accidently leaked urine? N  Do you have problems with loss of bowel control? N  Managing your Medications? N  Managing your Finances? N  Housekeeping or managing your Housekeeping? N    Patient Care Team: Tonia Ghent, MD as PCP - General (Family Medicine)  Indicate any recent Medical Services you may have received from other than [facility] providers in the past year (date may be approximate).     Assessment:   This is a routine wellness examination for [name].  Hearing/Vision screen Hearing Screening - Comments:: No aids Vision Screening - Comments:: Readers- [facility]  Dietary issues and exercise activities discussed: Current Exercise Habits: Home exercise routine, Type of exercise: walking, Time (Minutes): 30, Frequency (Times/Week): 3, Weekly Exercise (Minutes/Week): 90, Intensity: Mild   Goals Addressed             This Visit's Progress    DIET - EAT MORE FRUITS AND VEGETABLES         Depression Screen    10/13/2021    3:05 PM 10/09/2020    9:48 AM 09/27/2019    3:36 PM 09/19/2018    1:05 PM 10/19/2017    5:45 PM 10/01/2016    8:59 AM 12/17/2014    9:56 AM  PHQ 2/9 Scores  PHQ - 2 Score 0 0 0 0 0 0 0  PHQ- 9 Score 0 0 0 0       Fall Risk    10/13/2021    3:07 PM 10/09/2020    9:47 AM 10/16/2019    8:52 AM 09/27/2019    3:36 PM 09/19/2018    1:05 PM  Fall Risk   Falls in the past year? 0 0 0 0 0  Number falls in past yr: 0 0 0 0   Injury with Fall? 0 0 0 0   Risk for fall due to : No Fall Risks Medication side effect  Medication side effect   Follow up Falls evaluation completed Falls prevention discussed;Falls evaluation completed  Falls evaluation completed;Falls prevention discussed     FALL RISK  PREVENTION PERTAINING TO THE HOME:  Any stairs in or around the home? No  If so, are there any without handrails? No  Home free of loose throw rugs in walkways, pet beds, electrical cords, etc? Yes  Adequate  lighting in your home to reduce risk of falls? Yes   ASSISTIVE DEVICES UTILIZED TO PREVENT FALLS:  Life alert? No  Use of a cane, walker or w/c? No  Grab bars in the bathroom? No  Shower chair or bench in shower? No  Elevated toilet seat or a handicapped toilet? No    Cognitive Function:    10/09/2020    9:48 AM 09/27/2019    3:38 PM 09/19/2018    1:08 PM  MMSE - Mini Mental State Exam  Not completed: Refused Refused   Orientation to time   5  Orientation to Place   5  Registration   3  Attention/ Calculation   0  Recall   3  Language- name 2 objects   0  Language- repeat   1  Language- follow 3 step command   0  Language- read & follow direction   0  Write a sentence   0  Copy design   0  Total score   17        10/13/2021    3:09 PM  6CIT Screen  What Year? 0 points  What month? 0 points  What time? 0 points  Count back from 20 0 points  Months in reverse 0 points  Repeat phrase 0 points  Total Score 0 points    Immunizations Immunization History  Administered Date(s) Administered   Influenza, High Dose Seasonal PF 02/01/2017, 01/14/2020   Influenza-Unspecified 02/01/2014, 01/19/2019, 01/28/2021   Moderna Sars-Covid-2 Vaccination 04/10/2019, 05/07/2019, 02/05/2020, 01/13/2021   Pneumococcal Conjugate-13 10/01/2016   Pneumococcal Polysaccharide-23 10/19/2017   Td 04/05/2002   Tdap 09/05/2012   Zoster Recombinat (Shingrix) 01/14/2020, 07/11/2020   Zoster, Live 09/06/2013    TDAP status: Up to date  Flu Vaccine status: Up to date  Pneumococcal vaccine status: Up to date  Covid-19 vaccine status: Completed vaccines  Qualifies for Shingles Vaccine? Yes   Zostavax completed Yes   Shingrix Completed?: Yes  Screening Tests Health Maintenance  Topic Date Due   COLONOSCOPY (Pts 45-64yr Insurance coverage will need to be confirmed)  12/14/2020   COVID-19 Vaccine (5 - Booster for Moderna series) 03/10/2021   INFLUENZA VACCINE  11/03/2021   TETANUS/TDAP   09/06/2022   Pneumonia Vaccine 71 Years old  Completed   Hepatitis C Screening  Completed   Zoster Vaccines- Shingrix  Completed   HPV VACCINES  Aged Out    Health Maintenance  Health Maintenance Due  Topic Date Due   COLONOSCOPY (Pts 45-423yrInsurance coverage will need to be confirmed)  12/14/2020   COVID-19 Vaccine (5 - Booster for Moderna series) 03/10/2021    Colorectal cancer screening: Type of screening: Colonoscopy. Completed 12/15/15. Repeat every 5 years- letter from gastro said it will be 2 more years before he needs one  Lung Cancer Screening: (Low Dose CT Chest recommended if Age 71-80ears, 30 pack-year currently smoking OR have quit w/in 15years.) does not qualify.   Additional Screening:  Hepatitis C Screening: does qualify; Completed 09/23/15  Vision Screening: Recommended annual ophthalmology exams for early detection of glaucoma and other disorders of the eye. Is the patient up to date with their annual eye exam?  Yes  Who is the provider or what is the name  of the office in which the patient attends annual eye exams? [facility] If pt is not established with a provider, would they like to be referred to a provider to establish care? No .   Dental Screening: Recommended annual dental exams for proper oral hygiene  Community Resource Referral / Chronic Care Management: CRR required this visit?  No   CCM required this visit?  No      Plan:     I have personally reviewed and noted the following in the patient's chart:   Medical and social history Use of alcohol, tobacco or illicit drugs  Current medications and supplements including opioid prescriptions. Patient is not currently taking opioid prescriptions. Functional ability and status Nutritional status Physical activity Advanced directives List of other physicians Hospitalizations, surgeries, and ER visits in previous 12 months Vitals Screenings to include cognitive, depression, and  falls Referrals and appointments  In addition, I have reviewed and discussed with patient certain preventive protocols, quality metrics, and best practice recommendations. A written personalized care plan for preventive services as well as general preventive health recommendations were provided to patient.     Dionisio David, LPN   [id number]/50/[id number]   Nurse Notes: none

## 2021-10-13 NOTE — Patient Instructions (Addendum)
Alejandro Schneider , Thank you for taking time to come for your Medicare Wellness Visit. I appreciate your ongoing commitment to your health goals. Please review the following plan we discussed and let me know if I can assist you in the future.   Screening recommendations/referrals: Colonoscopy: 12/15/15, letter from gastro said he didn't need for another 2 years (7 years since his last) Recommended yearly ophthalmology/optometry visit for glaucoma screening and checkup Recommended yearly dental visit for hygiene and checkup  Vaccinations: Influenza vaccine: 01/28/21 Pneumococcal vaccine: 10/19/17 Tdap vaccine: 09/05/12 Shingles vaccine: Zostavax 09/06/13  Shingrix 01/14/20, 07/11/20   Covid-19: 04/10/19, 05/07/19, 02/05/20, 01/13/21  Advanced directives: no  Conditions/risks identified: none  Next appointment: Follow up in one year for your annual wellness visit. 10/15/22 @ 1:45 pm by phone  Preventive Care 65 Years and Older, Male Preventive care refers to lifestyle choices and visits with your health care provider that can promote health and wellness. What does preventive care include? A yearly physical exam. This is also called an annual well check. Dental exams once or twice a year. Routine eye exams. Ask your health care provider how often you should have your eyes checked. Personal lifestyle choices, including: Daily care of your teeth and gums. Regular physical activity. Eating a healthy diet. Avoiding tobacco and drug use. Limiting alcohol use. Practicing safe sex. Taking low doses of aspirin every day. Taking vitamin and mineral supplements as recommended by your health care provider. What happens during an annual well check? The services and screenings done by your health care provider during your annual well check will depend on your age, overall health, lifestyle risk factors, and family history of disease. Counseling  Your health care provider may ask you questions about your: Alcohol  use. Tobacco use. Drug use. Emotional well-being. Home and relationship well-being. Sexual activity. Eating habits. History of falls. Memory and ability to understand (cognition). Work and work Statistician. Screening  You may have the following tests or measurements: Height, weight, and BMI. Blood pressure. Lipid and cholesterol levels. These may be checked every 5 years, or more frequently if you are over 75 years old. Skin check. Lung cancer screening. You may have this screening every year starting at age 71 if you have a 30-pack-year history of smoking and currently smoke or have quit within the past 15 years. Fecal occult blood test (FOBT) of the stool. You may have this test every year starting at age 71. Flexible sigmoidoscopy or colonoscopy. You may have a sigmoidoscopy every 5 years or a colonoscopy every 10 years starting at age 71. Prostate cancer screening. Recommendations will vary depending on your family history and other risks. Hepatitis C blood test. Hepatitis B blood test. Sexually transmitted disease (STD) testing. Diabetes screening. This is done by checking your blood sugar (glucose) after you have not eaten for a while (fasting). You may have this done every 1-3 years. Abdominal aortic aneurysm (AAA) screening. You may need this if you are a current or former smoker. Osteoporosis. You may be screened starting at age 35 if you are at high risk. Talk with your health care provider about your test results, treatment options, and if necessary, the need for more tests. Vaccines  Your health care provider may recommend certain vaccines, such as: Influenza vaccine. This is recommended every year. Tetanus, diphtheria, and acellular pertussis (Tdap, Td) vaccine. You may need a Td booster every 10 years. Zoster vaccine. You may need this after age 51. Pneumococcal 13-valent conjugate (PCV13) vaccine. One  dose is recommended after age 42. Pneumococcal polysaccharide  (PPSV23) vaccine. One dose is recommended after age 40. Talk to your health care provider about which screenings and vaccines you need and how often you need them. This information is not intended to replace advice given to you by your health care provider. Make sure you discuss any questions you have with your health care provider. Document Released: 04/18/2015 Document Revised: 12/10/2015 Document Reviewed: 01/21/2015 Elsevier Interactive Patient Education  2017 [place] Prevention in the Home Falls can cause injuries. They can happen to people of all ages. There are many things you can do to make your home safe and to help prevent falls. What can I do on the outside of my home? Regularly fix the edges of walkways and driveways and fix any cracks. Remove anything that might make you trip as you walk through a door, such as a raised step or threshold. Trim any bushes or trees on the path to your home. Use bright outdoor lighting. Clear any walking paths of anything that might make someone trip, such as rocks or tools. Regularly check to see if handrails are loose or broken. Make sure that both sides of any steps have handrails. Any raised decks and porches should have guardrails on the edges. Have any leaves, snow, or ice cleared regularly. Use sand or salt on walking paths during winter. Clean up any spills in your garage right away. This includes oil or grease spills. What can I do in the bathroom? Use night lights. Install grab bars by the toilet and in the tub and shower. Do not use towel bars as grab bars. Use non-skid mats or decals in the tub or shower. If you need to sit down in the shower, use a plastic, non-slip stool. Keep the floor dry. Clean up any water that spills on the floor as soon as it happens. Remove soap buildup in the tub or shower regularly. Attach bath mats securely with double-sided non-slip rug tape. Do not have throw rugs and other things on the  floor that can make you trip. What can I do in the bedroom? Use night lights. Make sure that you have a light by your bed that is easy to reach. Do not use any sheets or blankets that are too big for your bed. They should not hang down onto the floor. Have a firm chair that has side arms. You can use this for support while you get dressed. Do not have throw rugs and other things on the floor that can make you trip. What can I do in the kitchen? Clean up any spills right away. Avoid walking on wet floors. Keep items that you use a lot in easy-to-reach places. If you need to reach something above you, use a strong step stool that has a grab bar. Keep electrical cords out of the way. Do not use floor polish or wax that makes floors slippery. If you must use wax, use non-skid floor wax. Do not have throw rugs and other things on the floor that can make you trip. What can I do with my stairs? Do not leave any items on the stairs. Make sure that there are handrails on both sides of the stairs and use them. Fix handrails that are broken or loose. Make sure that handrails are as long as the stairways. Check any carpeting to make sure that it is firmly attached to the stairs. Fix any carpet that is loose or worn.  Avoid having throw rugs at the top or bottom of the stairs. If you do have throw rugs, attach them to the floor with carpet tape. Make sure that you have a light switch at the top of the stairs and the bottom of the stairs. If you do not have them, ask someone to add them for you. What else can I do to help prevent falls? Wear shoes that: Do not have high heels. Have rubber bottoms. Are comfortable and fit you well. Are closed at the toe. Do not wear sandals. If you use a stepladder: Make sure that it is fully opened. Do not climb a closed stepladder. Make sure that both sides of the stepladder are locked into place. Ask someone to hold it for you, if possible. Clearly mark and make  sure that you can see: Any grab bars or handrails. First and last steps. Where the edge of each step is. Use tools that help you move around (mobility aids) if they are needed. These include: Canes. Walkers. Scooters. Crutches. Turn on the lights when you go into a dark area. Replace any light bulbs as soon as they burn out. Set up your furniture so you have a clear path. Avoid moving your furniture around. If any of your floors are uneven, fix them. If there are any pets around you, be aware of where they are. Review your medicines with your doctor. Some medicines can make you feel dizzy. This can increase your chance of falling. Ask your doctor what other things that you can do to help prevent falls. This information is not intended to replace advice given to you by your health care provider. Make sure you discuss any questions you have with your health care provider. Document Released: 01/16/2009 Document Revised: 08/28/2015 Document Reviewed: 04/26/2014 Elsevier Interactive Patient Education  2017 [employer].

## 2021-10-13 NOTE — Progress Notes (Signed)
t

## 2021-10-14 ENCOUNTER — Ambulatory Visit (HOSPITAL_COMMUNITY)
Admission: RE | Admit: 2021-10-14 | Discharge: 2021-10-14 | Disposition: A | Discharge status: Home / Self Care | Payer: Medicare Other | Source: Ambulatory Visit | Attending: Interventional Radiology | Admitting: Interventional Radiology

## 2021-10-14 DIAGNOSIS — I720 Aneurysm of carotid artery: Secondary | ICD-10-CM | POA: Diagnosis not present

## 2021-10-14 DIAGNOSIS — I671 Cerebral aneurysm, nonruptured: Secondary | ICD-10-CM | POA: Insufficient documentation

## 2021-10-16 ENCOUNTER — Telehealth: Payer: Self-pay | Admitting: Internal Medicine

## 2021-10-16 NOTE — Progress Notes (Addendum)
Imaging reviewed with Dr. Estanislado Pandy who recommends 6 month follow-up MRA.   Contacted pt and gave him recommendation that if he is not symptomatic or having new symptoms, he can to f/u with MRA scan in 6 months. Pt denies new symptoms. He was educated that if he has new onset symptoms, he should call 911. Pt in agreement with plan.  Schedulers were notified to call patient to schedule follow-up scan.    Narda Rutherford, AGNP-BC 10/16/2021, 2:58 PM

## 2021-10-19 ENCOUNTER — Telehealth (HOSPITAL_COMMUNITY): Payer: Self-pay

## 2021-10-19 NOTE — Telephone Encounter (Signed)
Pt agreed to f/u in 6 months with an mra. AW

## 2021-10-20 ENCOUNTER — Ambulatory Visit: Payer: Medicare Other | Admitting: Family Medicine

## 2021-10-20 ENCOUNTER — Ambulatory Visit (INDEPENDENT_AMBULATORY_CARE_PROVIDER_SITE_OTHER): Payer: Medicare Other | Admitting: Family Medicine

## 2021-10-20 ENCOUNTER — Encounter: Payer: Self-pay | Admitting: Family Medicine

## 2021-10-20 VITALS — BP 140/70 | HR 68 | Temp 97.6°F | Ht 69.0 in | Wt 211.0 lb

## 2021-10-20 DIAGNOSIS — I651 Occlusion and stenosis of basilar artery: Secondary | ICD-10-CM | POA: Diagnosis not present

## 2021-10-20 DIAGNOSIS — I10 Essential (primary) hypertension: Secondary | ICD-10-CM | POA: Diagnosis not present

## 2021-10-20 DIAGNOSIS — E291 Testicular hypofunction: Secondary | ICD-10-CM

## 2021-10-20 DIAGNOSIS — Z0000 Encounter for general adult medical examination without abnormal findings: Secondary | ICD-10-CM

## 2021-10-20 DIAGNOSIS — E785 Hyperlipidemia, unspecified: Secondary | ICD-10-CM | POA: Diagnosis not present

## 2021-10-20 DIAGNOSIS — Z7189 Other specified counseling: Secondary | ICD-10-CM

## 2021-10-20 MED ORDER — CLOPIDOGREL BISULFATE 75 MG PO TABS
ORAL_TABLET | ORAL | 3 refills | Status: DC
Start: 2021-10-20 — End: 2022-01-04

## 2021-10-20 MED ORDER — ROSUVASTATIN CALCIUM 10 MG PO TABS: 15.0000 mg | ORAL_TABLET | Freq: Every day | ORAL | Status: DC

## 2021-10-20 MED ORDER — AMLODIPINE BESYLATE 5 MG PO TABS: 5.0000 mg | ORAL_TABLET | Freq: Every day | ORAL | 3 refills | Status: DC

## 2021-10-20 NOTE — Progress Notes (Unsigned)
Hypertension:    Using medication without problems or lightheadedness: yes Chest pain with exertion:no Edema:no Short of breath:no Average home BPs: variable BPs noted.  Some lower than others.    Elevated Cholesterol: Using medications without problems:yes Muscle aches: occ cramping at night.  About once a week.   Diet compliance: yes Exercise: yes Recent MRI d/w pt.  D/w pt about inc statin.    Tetanus 2014 Shingrix prev done.  Flu prev done PNA 2019 covid 2021 Colonosocpy 2017.  Due 2024 per GI clinic.   PSA wnl.  D/w pt.  FH noted.   Living will d/w pt- wife designated if patient were incapacitated.   Diet and exercise d/w pt.  Exercise as tolerated, diet is good.  He is considering having knee replacement.  He is putting up with R knee pain.  Stiff and puffy.  Prev injection didn't help a lot.  He can update me as needed.  Low testosterone noted.  I am hesitant to start replacement given his vascular disease.  D/w pt.    Meds, vitals, and allergies reviewed.   PMH and SH reviewed  ROS: Per HPI unless specifically indicated in ROS section   GEN: nad, alert and oriented HEENT: ncat NECK: supple w/o LA CV: rrr. PULM: ctab, no inc wob ABD: soft, +bs EXT: no edema SKIN: no acute rash

## 2021-10-20 NOTE — Patient Instructions (Signed)
Try increasing crestor to '15mg'$ .  If not feeling any different then try '20mg'$ .  If tolerated at either dose, then recheck lipids in about 2 months, fasting lab visit.   If not tolerated let me know and we'll see about other options prior to a lab visit.  Take care.  Glad to see you.

## 2021-10-21 ENCOUNTER — Telehealth: Payer: Self-pay | Admitting: Family Medicine

## 2021-10-21 DIAGNOSIS — E291 Testicular hypofunction: Secondary | ICD-10-CM | POA: Insufficient documentation

## 2021-10-21 NOTE — Telephone Encounter (Signed)
OV notes and labs have been faxed to Dr. Purvis Sheffield office. I have asked questions below and asked for a call back with responses.

## 2021-10-21 NOTE — Assessment & Plan Note (Signed)
Continue amlodipine aspirin Plavix and Crestor.  Labs discussed with patient.

## 2021-10-21 NOTE — Telephone Encounter (Addendum)
Please send a copy of his most recent office visit note and labs to Dr. Gloriann Loan at [facility] urology.  I want to know if the patient still needs to follow-up with Dr. Gloriann Loan since his PSA has normalized.  I am hesitant to start testosterone replacement on this patient given his history of vascular disease.  I talked to the patient about that.  Appreciate urology input.

## 2021-10-21 NOTE — Assessment & Plan Note (Signed)
1. 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 2. 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 3. 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 4. 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 5. Posterior cerebral arteries are predominantly fed by the posterior communicating arteries bilaterally.  Discussed with patient about increasing Crestor to 15 mg and then 20 mg if tolerated.  See after visit summary.  We can recheck his lipids later on.  He will let me know if not tolerated.

## 2021-10-21 NOTE — Assessment & Plan Note (Signed)
Living will d/w pt- wife designated if patient were incapacitated.

## 2021-10-21 NOTE — Assessment & Plan Note (Signed)
Tetanus 2014 Shingrix prev done.  Flu prev done PNA 2019 covid 2021 Colonosocpy 2017.  Due 2024 per GI clinic.   PSA wnl.  D/w pt.  FH noted.   Living will d/w pt- wife designated if patient were incapacitated.   Diet and exercise d/w pt.  Exercise as tolerated, diet is good.

## 2021-10-21 NOTE — Assessment & Plan Note (Signed)
Discussed with patient about increasing Crestor to 15 mg and then 20 mg if tolerated.  See after visit summary.  We can recheck his lipids later on.  He will let me know if not tolerated.

## 2021-10-21 NOTE — Assessment & Plan Note (Signed)
Low testosterone noted.  I am hesitant to start replacement given his vascular disease.  D/w pt.   I will update urology about this.  Testosterone levels checked due to fatigue.

## 2021-11-11 ENCOUNTER — Telehealth (HOSPITAL_COMMUNITY): Payer: Self-pay

## 2021-11-11 NOTE — Telephone Encounter (Signed)
Called pt back to let him know that he can't ride roller coasters per Dr. Estanislado Pandy. AW

## 2022-01-04 ENCOUNTER — Other Ambulatory Visit: Payer: Self-pay | Admitting: Family Medicine

## 2022-01-11 DIAGNOSIS — D3132 Benign neoplasm of left choroid: Secondary | ICD-10-CM | POA: Diagnosis not present

## 2022-02-03 DIAGNOSIS — Z85828 Personal history of other malignant neoplasm of skin: Secondary | ICD-10-CM | POA: Diagnosis not present

## 2022-02-03 DIAGNOSIS — L814 Other melanin hyperpigmentation: Secondary | ICD-10-CM | POA: Diagnosis not present

## 2022-02-03 DIAGNOSIS — L570 Actinic keratosis: Secondary | ICD-10-CM | POA: Diagnosis not present

## 2022-02-03 DIAGNOSIS — L821 Other seborrheic keratosis: Secondary | ICD-10-CM | POA: Diagnosis not present

## 2022-02-03 DIAGNOSIS — D225 Melanocytic nevi of trunk: Secondary | ICD-10-CM | POA: Diagnosis not present

## 2022-02-03 DIAGNOSIS — Z08 Encounter for follow-up examination after completed treatment for malignant neoplasm: Secondary | ICD-10-CM | POA: Diagnosis not present

## 2022-02-19 DIAGNOSIS — Z23 Encounter for immunization: Secondary | ICD-10-CM | POA: Diagnosis not present

## 2022-02-19 DIAGNOSIS — Z0189 Encounter for other specified special examinations: Secondary | ICD-10-CM | POA: Diagnosis not present

## 2022-03-08 DIAGNOSIS — G8918 Other acute postprocedural pain: Secondary | ICD-10-CM | POA: Diagnosis not present

## 2022-03-08 DIAGNOSIS — M1711 Unilateral primary osteoarthritis, right knee: Secondary | ICD-10-CM | POA: Diagnosis not present

## 2022-03-08 HISTORY — PX: TOTAL KNEE ARTHROPLASTY: SHX125

## 2022-03-11 DIAGNOSIS — M25661 Stiffness of right knee, not elsewhere classified: Secondary | ICD-10-CM | POA: Diagnosis not present

## 2022-03-15 DIAGNOSIS — M25661 Stiffness of right knee, not elsewhere classified: Secondary | ICD-10-CM | POA: Diagnosis not present

## 2022-03-17 DIAGNOSIS — M25661 Stiffness of right knee, not elsewhere classified: Secondary | ICD-10-CM | POA: Diagnosis not present

## 2022-03-18 DIAGNOSIS — Z4789 Encounter for other orthopedic aftercare: Secondary | ICD-10-CM | POA: Diagnosis not present

## 2022-03-19 DIAGNOSIS — M25661 Stiffness of right knee, not elsewhere classified: Secondary | ICD-10-CM | POA: Diagnosis not present

## 2022-03-22 DIAGNOSIS — M25661 Stiffness of right knee, not elsewhere classified: Secondary | ICD-10-CM | POA: Diagnosis not present

## 2022-03-24 DIAGNOSIS — M25661 Stiffness of right knee, not elsewhere classified: Secondary | ICD-10-CM | POA: Diagnosis not present

## 2022-03-26 DIAGNOSIS — M25661 Stiffness of right knee, not elsewhere classified: Secondary | ICD-10-CM | POA: Diagnosis not present

## 2022-03-31 DIAGNOSIS — M25661 Stiffness of right knee, not elsewhere classified: Secondary | ICD-10-CM | POA: Diagnosis not present

## 2022-04-02 DIAGNOSIS — M25661 Stiffness of right knee, not elsewhere classified: Secondary | ICD-10-CM | POA: Diagnosis not present

## 2022-04-06 DIAGNOSIS — M25661 Stiffness of right knee, not elsewhere classified: Secondary | ICD-10-CM | POA: Diagnosis not present

## 2022-04-08 DIAGNOSIS — M25661 Stiffness of right knee, not elsewhere classified: Secondary | ICD-10-CM | POA: Diagnosis not present

## 2022-04-13 DIAGNOSIS — M25661 Stiffness of right knee, not elsewhere classified: Secondary | ICD-10-CM | POA: Diagnosis not present

## 2022-04-16 DIAGNOSIS — M25661 Stiffness of right knee, not elsewhere classified: Secondary | ICD-10-CM | POA: Diagnosis not present

## 2022-04-16 IMAGING — CR DG KNEE COMPLETE 4+V*R*
4 series · 4 of 4 positions shown · non-contrast
Comparison: None.

CLINICAL DATA: Right leg pain

EXAM:
RIGHT KNEE - COMPLETE 4+ VIEW

[w knee ap right]
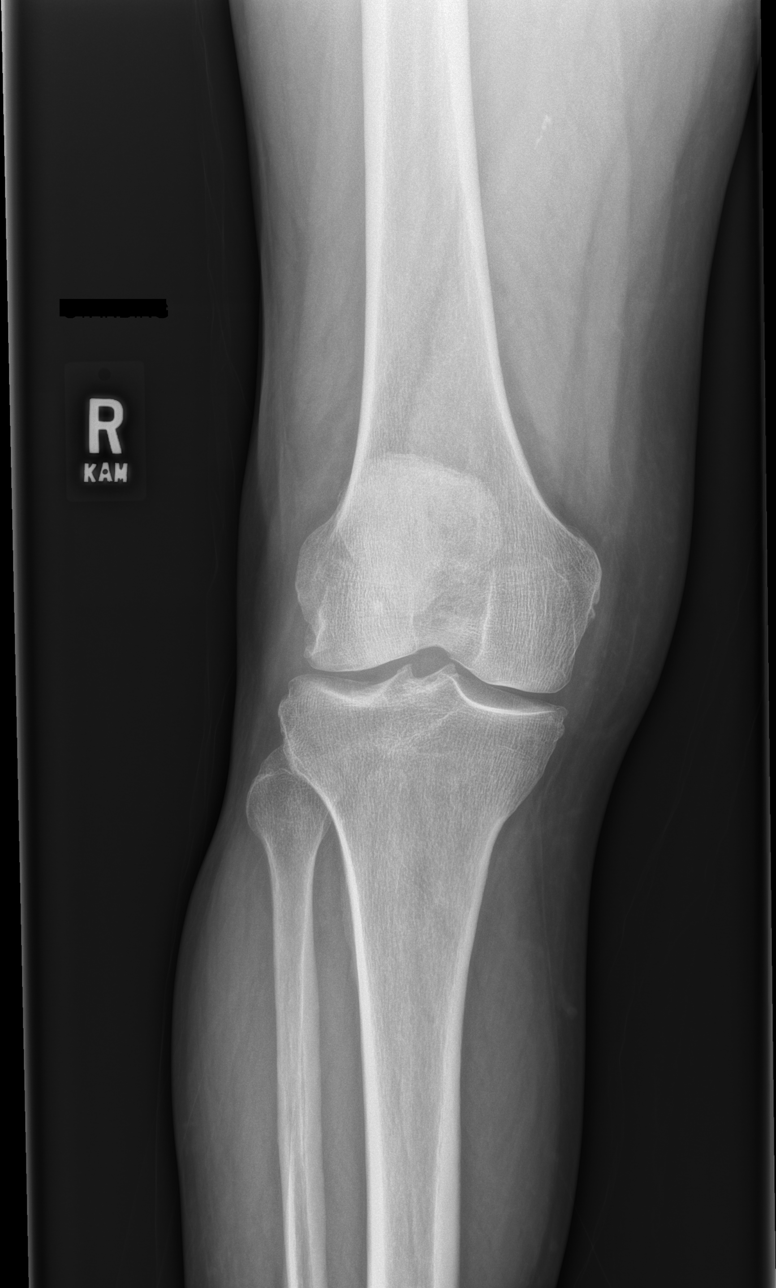

[w knee lat right]
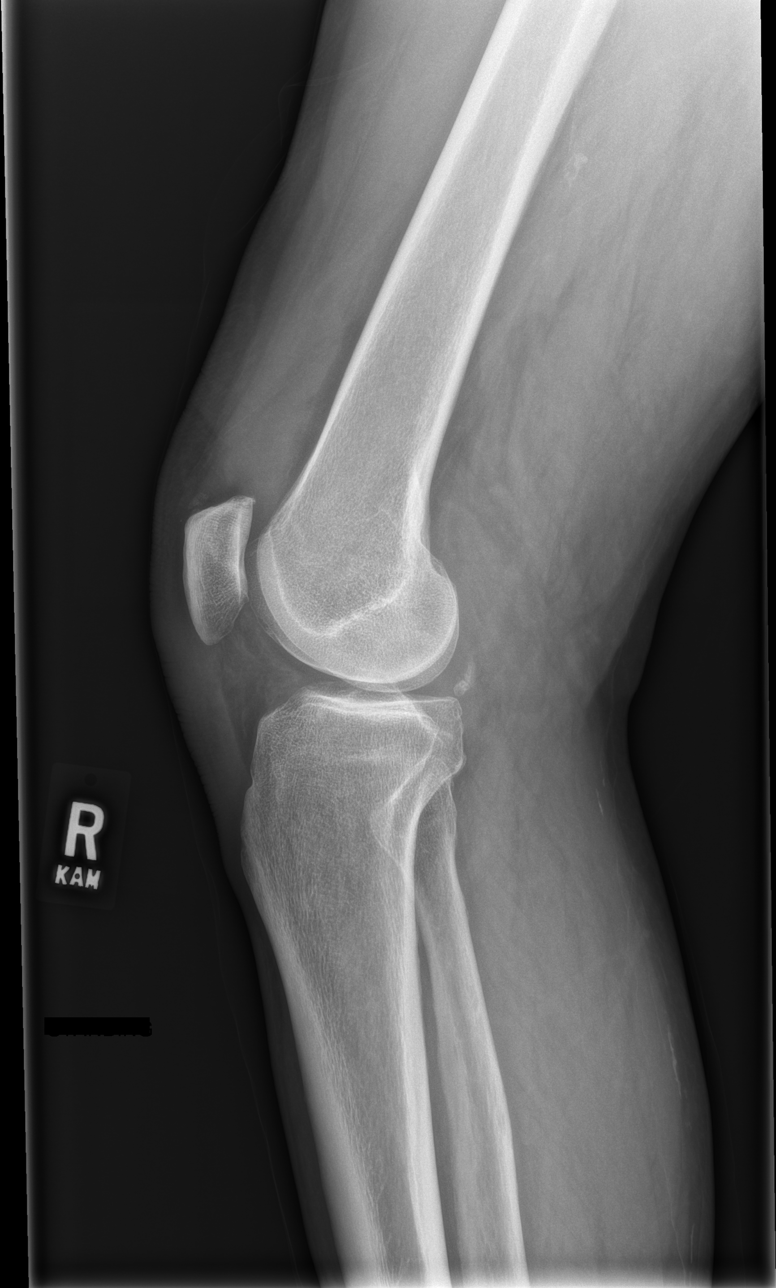

[w knee tunnel pa right]
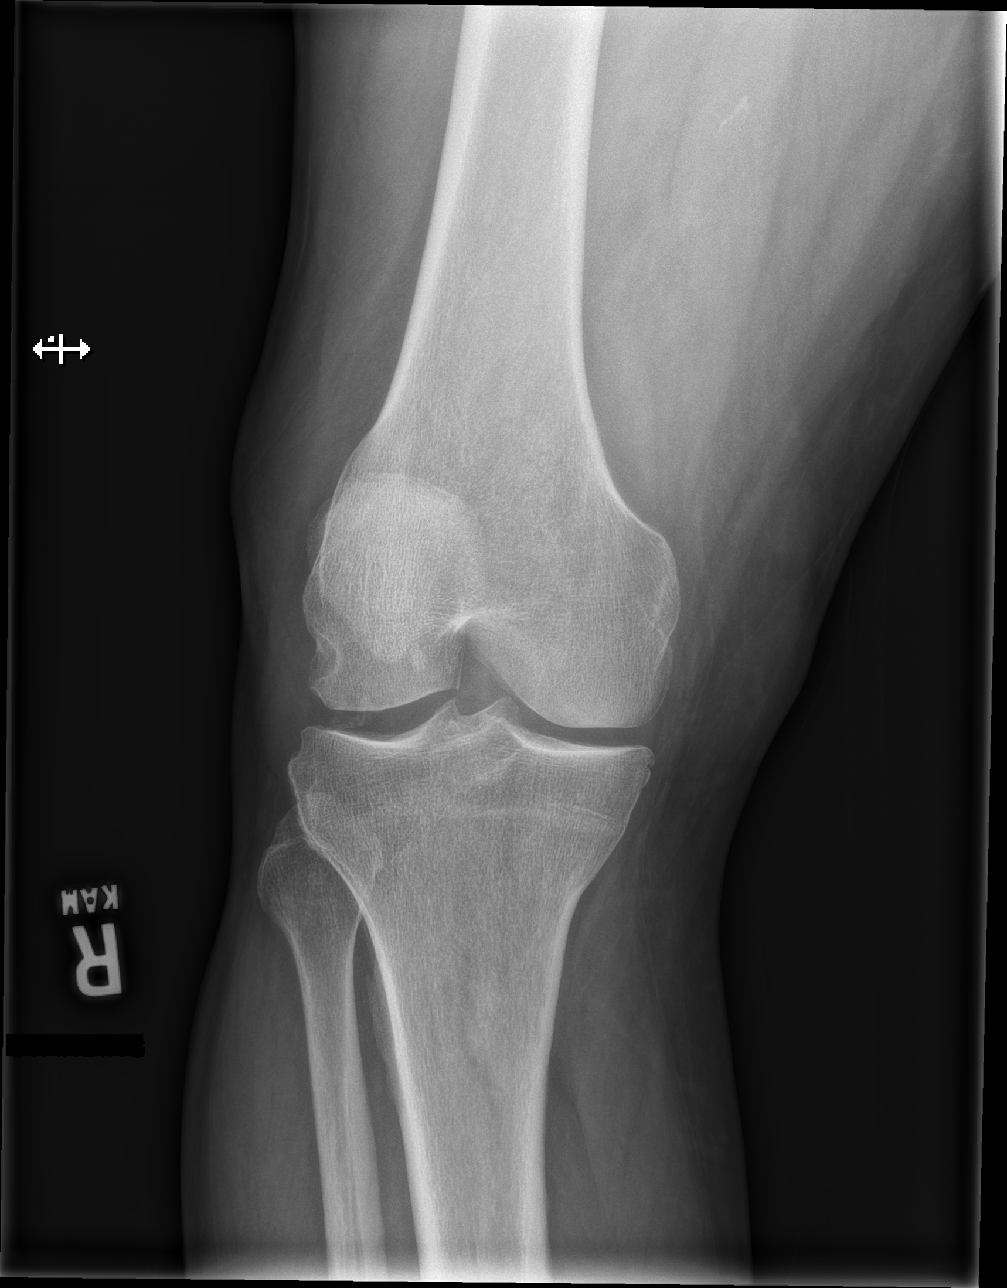

[x knee sunrise right]
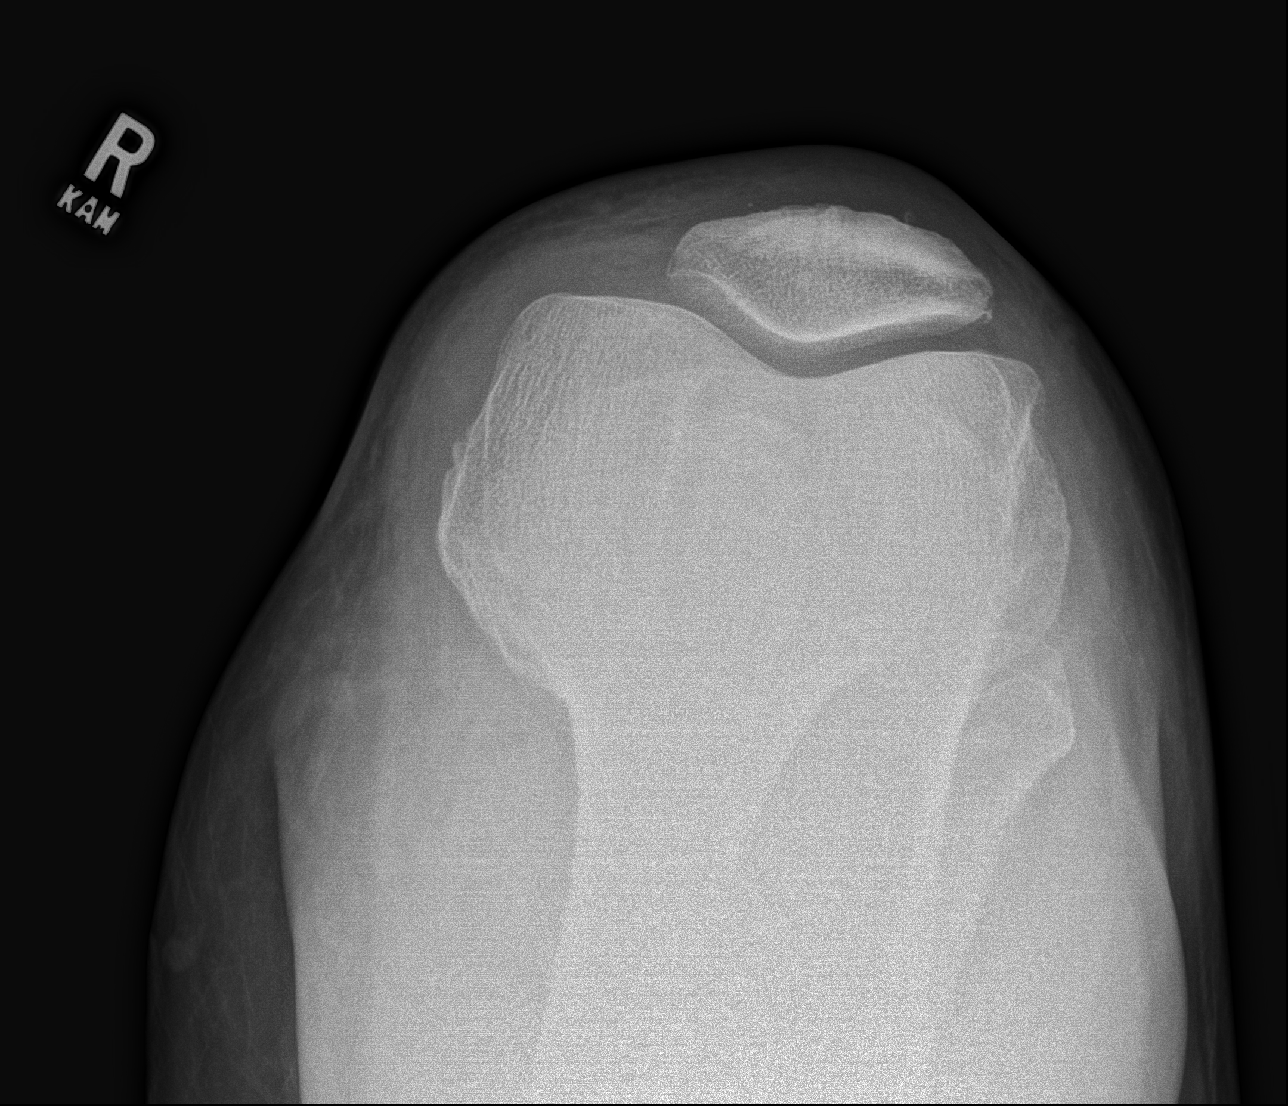

[4 of 4 positions shown; findings below may reference images not displayed]

FINDINGS: Normal alignment.  No fracture or bone lesion.

Joint spaces maintained. There is a joint effusion. Calcified loose
body posterior to the joint space laterally.
IMPRESSION: Calcified loose body and joint effusion. No significant joint space
narrowing.

## 2022-05-04 ENCOUNTER — Other Ambulatory Visit (HOSPITAL_COMMUNITY): Payer: Self-pay | Admitting: Interventional Radiology

## 2022-05-04 ENCOUNTER — Telehealth (HOSPITAL_COMMUNITY): Payer: Self-pay

## 2022-05-04 DIAGNOSIS — I671 Cerebral aneurysm, nonruptured: Secondary | ICD-10-CM

## 2022-05-04 NOTE — Telephone Encounter (Signed)
Called to schedule mra, no answer, left vm. AB  

## 2022-05-06 ENCOUNTER — Telehealth (INDEPENDENT_AMBULATORY_CARE_PROVIDER_SITE_OTHER): Payer: Medicare Other | Admitting: Family Medicine

## 2022-05-06 ENCOUNTER — Encounter: Payer: Self-pay | Admitting: Family Medicine

## 2022-05-06 ENCOUNTER — Telehealth: Payer: Self-pay | Admitting: Family Medicine

## 2022-05-06 VITALS — BP 142/82 | HR 62 | Temp 98.4°F | Ht 69.0 in | Wt 211.0 lb

## 2022-05-06 DIAGNOSIS — U071 COVID-19: Secondary | ICD-10-CM | POA: Diagnosis not present

## 2022-05-06 MED ORDER — MOLNUPIRAVIR EUA 200MG CAPSULE: 4.0000 | ORAL_CAPSULE | Freq: Two times a day (BID) | ORAL | 0 refills | Status: DC

## 2022-05-06 MED ORDER — MOLNUPIRAVIR EUA 200MG CAPSULE: 4.0000 | ORAL_CAPSULE | Freq: Two times a day (BID) | ORAL | 0 refills | Status: AC

## 2022-05-06 NOTE — Progress Notes (Signed)
VIRTUAL VISIT A virtual visit is felt to be most appropriate for this patient at this time.   I connected with the patient on 05/06/22 at  9:20 AM EST by virtual telehealth platform and verified that I am speaking with the correct person using two identifiers.   I discussed the limitations, risks, security and privacy concerns of performing an evaluation and management service by  virtual telehealth platform and the availability of in person appointments. I also discussed with the patient that there may be a patient responsible charge related to this service. The patient expressed understanding and agreed to proceed.  Patient location: Home Provider Location: [place] Participants: Alejandro Schneider and [name]   Chief Complaint  Patient presents with   Covid Positive    Tested + on 1/29   Cough    History of Present Illness:  72 y.o. male patient of Alejandro Ghent, MD with history of CVA and hypertension  presents with  COVID infection.   Date of onset: day 4-5 of illness Initial symptoms included  congestion, runny nose, headache Symptoms progressed to cough occ.  No body aches. No fever No sob, no wheeze.   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 COVID testing:   positive home test on 05/03/21     He has tried to treat with AlkasSelzer cold med    No history of chronic lung disease such as asthma or COPD. Non-smoker.   PAXLOVID CONTRAINDICATED GIVEN ON AMLODIPINE AND PLAVIX.   COVID 19 screen No recent travel or known exposure to COVID19 The patient denies respiratory symptoms of COVID 19 at this time.  The importance of social distancing was discussed today.   Review of Systems  Constitutional:  Negative for chills and fever.  HENT:  Positive for congestion. Negative for ear pain.   Eyes:  Negative for pain and redness.  Respiratory:  Positive for cough. Negative for shortness of breath.   Cardiovascular:  Negative for chest pain,  palpitations and leg swelling.  Gastrointestinal:  Negative for abdominal pain, blood in stool, constipation, diarrhea, nausea and vomiting.  Genitourinary:  Negative for dysuria.  Musculoskeletal:  Negative for falls and myalgias.  Skin:  Negative for rash.  Neurological:  Negative for dizziness.  Psychiatric/Behavioral:  Negative for depression. The patient is not nervous/anxious.       Past Medical History:  Diagnosis Date   CVA (cerebral infarction)    Hyperlipidemia    Hypertension    Internal hemorrhoids    Stroke [facility])     reports that he has never smoked. He has never used smokeless tobacco. He reports that he does not currently use alcohol. He reports that he does not use drugs.   Current Outpatient Medications:    molnupiravir EUA (LAGEVRIO) 200 mg CAPS capsule, Take 4 capsules (800 mg total) by mouth 2 (two) times daily for 5 days., Disp: 40 capsule, Rfl: 0   amLODipine (NORVASC) 5 MG tablet, TAKE ONE TABLET BY MOUTH ONE TIME DAILY, Disp: 90 tablet, Rfl: 3   aspirin 81 MG chewable tablet, CHEW ONE TABLET BY MOUTH TWICE A DAY FOR 30 DAYS, Disp: , Rfl:    clopidogrel (PLAVIX) 75 MG tablet, TAKE ONE TABLET BY MOUTH ONE TIME DAILY, Disp: 90 tablet, Rfl: 3   clotrimazole-betamethasone (LOTRISONE) cream, Apply 1 application topically 2 (two) times daily as needed., Disp: 30 g, Rfl: 1   Multiple Vitamins-Minerals (MULTIVITAMIN,TX-MINERALS) tablet, Take 1 tablet by mouth daily., Disp: ,  Rfl:    Omega-3 Fatty Acids (FISH OIL) 1000 MG CAPS, Take 1,000 mg by mouth daily., Disp: , Rfl:    rosuvastatin (CRESTOR) 10 MG tablet, TAKE ONE TABLET BY MOUTH ONE TIME DAILY, Disp: 90 tablet, Rfl: 3   Observations/Objective: Blood pressure (!) 142/82, pulse 62, temperature 98.4 F (36.9 C), height '5\' 9"'$  (1.753 m), weight 211 lb (95.7 kg).  Physical Exam Constitutional:      General: The patient is not in acute distress. Pulmonary:     Effort: Pulmonary effort is normal. No respiratory  distress.  Neurological:     Mental Status: The patient is alert and oriented to person, place, and time.  Psychiatric:        Mood and Affect: Mood normal.        Behavior: Behavior normal.    Assessment and Plan COVID-19 Assessment & Plan: COVID19  Infection < 5 days from onset of symptoms in  vaccinated overweight individual with history of HTN and CVA  No clear sign of bacterial infection at this time.   No SOB.  No red flags/need for ER visit or in-person exam at respiratory clinic at this time..    Pt higher risk for COVID complications given  age and HTN. Paxlovid contraindicated given on amlodipine and Plavix.  Will treat with molnupiravir.  Start molnupiravir 5 day course. Reviewed course of medication and side effect profile with patient in detail.   Symptomatic care with mucinex and cough suppressant at night. If SOB begins symptoms worsening.. have low threshold for in-person exam, if severe shortness of breath ER visit recommended.  Can monitor Oxygen saturation at home with home monitor if able to obtain.  Go to ER if O2 sat < 90% on room air.   Reviewed home care and provided information through [place].  Recommended quarantine 5 days isolation recommended. Return to work day 6 and wear mask for 4 more days to complete 10 days. Provided info about prevention of spread of COVID 19.    Other orders -     molnupiravir EUA; Take 4 capsules (800 mg total) by mouth 2 (two) times daily for 5 days.  Dispense: 40 capsule; Refill: 0      I discussed the assessment and treatment plan with the patient. The patient was provided an opportunity to ask questions and all were answered. The patient agreed with the plan and demonstrated an understanding of the instructions.   The patient was advised to call back or seek an in-person evaluation if the symptoms worsen or if the condition fails to improve as anticipated.     Alejandro Lofts, MD

## 2022-05-06 NOTE — Telephone Encounter (Signed)
Patient called in stating that [facility] do not have medication molnupiravir EUA (LAGEVRIO) 200 mg CAPS capsule in stock,would like it sent in to  [facility] [place] ([place] [place] Phone: [phone]  Fax: [phone]

## 2022-05-06 NOTE — Assessment & Plan Note (Signed)
COVID19  Infection < 5 days from onset of symptoms in  vaccinated overweight individual with history of HTN and CVA  No clear sign of bacterial infection at this time.   No SOB.  No red flags/need for ER visit or in-person exam at respiratory clinic at this time..    Pt higher risk for COVID complications given  age and HTN. Paxlovid contraindicated given on amlodipine and Plavix.  Will treat with molnupiravir.  Start molnupiravir 5 day course. Reviewed course of medication and side effect profile with patient in detail.   Symptomatic care with mucinex and cough suppressant at night. If SOB begins symptoms worsening.. have low threshold for in-person exam, if severe shortness of breath ER visit recommended.  Can monitor Oxygen saturation at home with home monitor if able to obtain.  Go to ER if O2 sat < 90% on room air.   Reviewed home care and provided information through [place].  Recommended quarantine 5 days isolation recommended. Return to work day 6 and wear mask for 4 more days to complete 10 days. Provided info about prevention of spread of COVID 19.

## 2022-05-06 NOTE — Telephone Encounter (Signed)
Rx resent to [facility] on [address] as requested.  Left message for [facility] that prescription has been resent as requested.

## 2022-05-18 ENCOUNTER — Ambulatory Visit (HOSPITAL_COMMUNITY)
Admission: RE | Admit: 2022-05-18 | Discharge: 2022-05-18 | Disposition: A | Discharge status: Home / Self Care | Payer: Medicare Other | Source: Ambulatory Visit | Attending: Interventional Radiology | Admitting: Interventional Radiology

## 2022-05-18 DIAGNOSIS — I671 Cerebral aneurysm, nonruptured: Secondary | ICD-10-CM | POA: Diagnosis not present

## 2022-05-24 ENCOUNTER — Telehealth (HOSPITAL_COMMUNITY): Payer: Self-pay

## 2022-05-24 NOTE — Telephone Encounter (Signed)
Pt agreed to f/u in 6-8 months with an mra head wo. AB

## 2022-06-14 DIAGNOSIS — K080 Exfoliation of teeth due to systemic causes: Secondary | ICD-10-CM | POA: Diagnosis not present

## 2022-06-16 ENCOUNTER — Telehealth: Payer: Self-pay | Admitting: Family Medicine

## 2022-06-16 MED ORDER — SILDENAFIL CITRATE 20 MG PO TABS: 20.0000 mg | ORAL_TABLET | Freq: Every day | ORAL | 2 refills | Status: DC | PRN

## 2022-06-16 NOTE — Telephone Encounter (Signed)
LMTCB

## 2022-06-16 NOTE — Telephone Encounter (Signed)
Patient called back and is asking for a prescription for ED. He has not been seen since his CPE 10/2021. Can you prescribe before seeing him or does he need an appt to discuss?

## 2022-06-16 NOTE — Telephone Encounter (Signed)
Pt called asking for a call back from [place] to discuss an erection. Call back # [phone]

## 2022-06-16 NOTE — Telephone Encounter (Signed)
Please verify that he is not having chest pain or using nitroglycerin.  If he has no chest pain and is not using nitroglycerin then he could try using sildenafil.  Start with 20 mg per dose and gradually increase the dose up to 100 mg per dose if needed.  100 mg/day is the maximum dose.  Update me as needed.  Thanks.

## 2022-06-16 NOTE — Addendum Note (Signed)
Addended by: Tonia Ghent on: 06/16/2022 04:42 PM   Modules accepted: Orders

## 2022-06-17 NOTE — Telephone Encounter (Signed)
Called patient and reviewed all information. Patient verbalized understanding. Will call if any further questions.  

## 2022-06-17 NOTE — Telephone Encounter (Signed)
He could hold crestor for 1 week and see if he had a sig improvement.  If sig better, then let me know.  If not better, then would restart.  Thanks.

## 2022-06-17 NOTE — Telephone Encounter (Signed)
Called patient and reviewed all information. Patient verbalized understanding.   Patient stated he has had an increase in bilateral knee pain and wanted to know if you would advise him to stop taking the rosuvastatin for a period of time to see if this is the cause? He stated yall dicussed this at a previous visit he wasn't having any joint pain then but was told to advise you if he did develop any that it may be from his cholesterol medication.

## 2022-06-24 DIAGNOSIS — K080 Exfoliation of teeth due to systemic causes: Secondary | ICD-10-CM | POA: Diagnosis not present

## 2022-06-29 ENCOUNTER — Telehealth (HOSPITAL_COMMUNITY): Payer: Self-pay

## 2022-06-29 NOTE — Telephone Encounter (Signed)
Returned pt's call, no answer, left vm. AB  

## 2022-06-29 NOTE — Telephone Encounter (Signed)
Pt called wanting to know if he can take Sildenafil. Pt stated that his PCP was fine with it but wanted to check with Deveshwar. Sent a message to our [place] and she discussed with Dr. Estanislado Pandy. Per Dr. Estanislado Pandy, it is ok to take this medicine. AB

## 2022-08-11 DIAGNOSIS — M25461 Effusion, right knee: Secondary | ICD-10-CM | POA: Diagnosis not present

## 2022-08-11 DIAGNOSIS — Z4789 Encounter for other orthopedic aftercare: Secondary | ICD-10-CM | POA: Diagnosis not present

## 2022-09-27 ENCOUNTER — Other Ambulatory Visit (HOSPITAL_COMMUNITY): Payer: Self-pay | Admitting: Physician Assistant

## 2022-09-27 DIAGNOSIS — Z4789 Encounter for other orthopedic aftercare: Secondary | ICD-10-CM | POA: Diagnosis not present

## 2022-10-12 ENCOUNTER — Other Ambulatory Visit: Payer: Medicare Other

## 2022-10-15 ENCOUNTER — Encounter: Payer: Medicare Other | Admitting: Family Medicine

## 2022-10-18 ENCOUNTER — Encounter (HOSPITAL_COMMUNITY): Payer: Self-pay

## 2022-10-18 ENCOUNTER — Ambulatory Visit (HOSPITAL_COMMUNITY): Payer: Medicare Other

## 2022-10-20 ENCOUNTER — Other Ambulatory Visit (INDEPENDENT_AMBULATORY_CARE_PROVIDER_SITE_OTHER): Payer: Medicare Other

## 2022-10-20 ENCOUNTER — Other Ambulatory Visit: Payer: Self-pay | Admitting: Family Medicine

## 2022-10-20 DIAGNOSIS — I10 Essential (primary) hypertension: Secondary | ICD-10-CM

## 2022-10-20 DIAGNOSIS — R739 Hyperglycemia, unspecified: Secondary | ICD-10-CM

## 2022-10-20 DIAGNOSIS — Z125 Encounter for screening for malignant neoplasm of prostate: Secondary | ICD-10-CM

## 2022-10-20 DIAGNOSIS — E785 Hyperlipidemia, unspecified: Secondary | ICD-10-CM

## 2022-10-20 DIAGNOSIS — Z4789 Encounter for other orthopedic aftercare: Secondary | ICD-10-CM

## 2022-10-21 ENCOUNTER — Other Ambulatory Visit (HOSPITAL_COMMUNITY): Payer: Self-pay | Admitting: Physician Assistant

## 2022-10-21 ENCOUNTER — Other Ambulatory Visit: Payer: Self-pay | Admitting: Physician Assistant

## 2022-10-21 DIAGNOSIS — M25561 Pain in right knee: Secondary | ICD-10-CM

## 2022-10-21 DIAGNOSIS — Z4789 Encounter for other orthopedic aftercare: Secondary | ICD-10-CM

## 2022-10-21 LAB — LIPID PANEL
Cholesterol: 135 mg/dL (ref 0–200)
HDL: 40.9 mg/dL (ref >39.00)
LDL Cholesterol: 63 mg/dL (ref 0–99)
NonHDL: 93.8
Total CHOL/HDL Ratio: 3
Triglycerides: 153 mg/dL — ABNORMAL HIGH (ref 0.0–149.0)
VLDL: 30.6 mg/dL (ref 0.0–40.0)

## 2022-10-21 LAB — TSH: TSH: 1.86 u[IU]/mL (ref 0.35–5.50)

## 2022-10-21 LAB — CBC WITH DIFFERENTIAL/PLATELET
Basophils Absolute: 0.1 10*3/uL (ref 0.0–0.1)
Basophils Relative: 1.1 % (ref 0.0–3.0)
Eosinophils Absolute: 0.5 10*3/uL (ref 0.0–0.7)
Eosinophils Relative: 8 % — ABNORMAL HIGH (ref 0.0–5.0)
HCT: 44.4 % (ref 39.0–52.0)
Hemoglobin: 14.9 g/dL (ref 13.0–17.0)
Lymphocytes Relative: 19.1 % (ref 12.0–46.0)
Lymphs Abs: 1.2 10*3/uL (ref 0.7–4.0)
MCHC: 33.5 g/dL (ref 30.0–36.0)
MCV: 92.2 fl (ref 78.0–100.0)
Monocytes Absolute: 0.7 10*3/uL (ref 0.1–1.0)
Monocytes Relative: 11.8 % (ref 3.0–12.0)
Neutro Abs: 3.6 10*3/uL (ref 1.4–7.7)
Neutrophils Relative %: 60 % (ref 43.0–77.0)
Platelets: 181 10*3/uL (ref 150.0–400.0)
RBC: 4.82 Mil/uL (ref 4.22–5.81)
RDW: 14.6 % (ref 11.5–15.5)
WBC: 6 10*3/uL (ref 4.0–10.5)

## 2022-10-21 LAB — COMPREHENSIVE METABOLIC PANEL
ALT: 22 U/L (ref 0–53)
AST: 16 U/L (ref 0–37)
Albumin: 4.2 g/dL (ref 3.5–5.2)
Alkaline Phosphatase: 75 U/L (ref 39–117)
BUN: 13 mg/dL (ref 6–23)
CO2: 28 mEq/L (ref 19–32)
Calcium: 9 mg/dL (ref 8.4–10.5)
Chloride: 104 mEq/L (ref 96–112)
Creatinine, Ser: 0.83 mg/dL (ref 0.40–1.50)
GFR: 87.88 mL/min (ref >60.00)
Glucose, Bld: 134 mg/dL — ABNORMAL HIGH (ref 70–99)
Potassium: 4.4 mEq/L (ref 3.5–5.1)
Sodium: 139 mEq/L (ref 135–145)
Total Bilirubin: 0.8 mg/dL (ref 0.2–1.2)
Total Protein: 6.3 g/dL (ref 6.0–8.3)

## 2022-10-21 LAB — HEMOGLOBIN A1C: Hgb A1c MFr Bld: 6 % (ref 4.6–6.5)

## 2022-10-21 LAB — PSA, MEDICARE: PSA: 3.38 ng/ml (ref 0.10–4.00)

## 2022-10-22 ENCOUNTER — Encounter (HOSPITAL_COMMUNITY): Admission: RE | Admit: 2022-10-22 | Payer: Medicare Other | Source: Ambulatory Visit

## 2022-10-22 ENCOUNTER — Ambulatory Visit (HOSPITAL_COMMUNITY): Admission: RE | Admit: 2022-10-22 | Payer: Medicare Other | Source: Ambulatory Visit

## 2022-10-22 ENCOUNTER — Encounter (HOSPITAL_COMMUNITY): Payer: Medicare Other

## 2022-10-22 ENCOUNTER — Encounter (HOSPITAL_COMMUNITY): Payer: Self-pay

## 2022-10-25 ENCOUNTER — Ambulatory Visit (HOSPITAL_COMMUNITY)
Admission: RE | Admit: 2022-10-25 | Discharge: 2022-10-25 | Disposition: A | Discharge status: Home / Self Care | Payer: Medicare Other | Source: Ambulatory Visit | Attending: Physician Assistant | Admitting: Physician Assistant

## 2022-10-25 DIAGNOSIS — Z96651 Presence of right artificial knee joint: Secondary | ICD-10-CM | POA: Diagnosis not present

## 2022-10-25 DIAGNOSIS — M25561 Pain in right knee: Secondary | ICD-10-CM | POA: Diagnosis not present

## 2022-10-25 DIAGNOSIS — Z4789 Encounter for other orthopedic aftercare: Secondary | ICD-10-CM | POA: Diagnosis not present

## 2022-10-25 DIAGNOSIS — M7989 Other specified soft tissue disorders: Secondary | ICD-10-CM | POA: Diagnosis not present

## 2022-10-25 MED ORDER — TECHNETIUM TC 99M MEDRONATE IV KIT
20.0000 | PACK | Freq: Once | INTRAVENOUS | Status: AC | PRN
  Administered 2022-10-25: 20.7 via INTRAVENOUS

## 2022-10-26 ENCOUNTER — Encounter: Payer: Self-pay | Admitting: *Deleted

## 2022-10-26 ENCOUNTER — Ambulatory Visit: Payer: Medicare Other | Admitting: Family Medicine

## 2022-10-26 ENCOUNTER — Encounter: Payer: Self-pay | Admitting: Family Medicine

## 2022-10-26 VITALS — BP 138/70 | HR 67 | Temp 98.4°F | Ht 69.0 in | Wt 216.0 lb

## 2022-10-26 DIAGNOSIS — E785 Hyperlipidemia, unspecified: Secondary | ICD-10-CM | POA: Diagnosis not present

## 2022-10-26 DIAGNOSIS — Z7189 Other specified counseling: Secondary | ICD-10-CM

## 2022-10-26 DIAGNOSIS — I10 Essential (primary) hypertension: Secondary | ICD-10-CM

## 2022-10-26 DIAGNOSIS — N529 Male erectile dysfunction, unspecified: Secondary | ICD-10-CM | POA: Diagnosis not present

## 2022-10-26 DIAGNOSIS — Z0000 Encounter for general adult medical examination without abnormal findings: Secondary | ICD-10-CM

## 2022-10-26 MED ORDER — SILDENAFIL CITRATE 20 MG PO TABS: 20.0000 mg | ORAL_TABLET | Freq: Every day | ORAL | 12 refills | Status: DC | PRN

## 2022-10-26 NOTE — Patient Instructions (Addendum)
Let me know if you don't get a call about scheduling the heart scan.  Take care.  Glad to see you. I would get a flu shot each fall.   Tetanus shot when possible.  GI should call you this fall.  I'll await the ortho notes/bone scan results.

## 2022-10-26 NOTE — Progress Notes (Unsigned)
Tetanus 2014 Shingrix prev done.  Flu prev done PNA 2019 covid 2021 Colonosocpy 2017.  Due 2024 per GI clinic.  D/w pt.   PSA wnl.  D/w pt.  FH noted.   Living will d/w pt- wife designated if patient were incapacitated.   Diet and exercise d/w pt.  Exercise as tolerated, diet is good.  We talked about cardiology eval/options given his vascular status.  We agreed to proceed with calcium scoring, d/w pt.    Hypertension:    Using medication without problems or lightheadedness: yes Chest pain with exertion:no Edema:no Short of breath:no  R knee puffy with bone scan done, awaiting report.  Discussed.  Sugar was 89 on home check, d/w pt.    H/o ED. No NTG use.  Needed refill on sildenail.  It helped.    Elevated Cholesterol: Using medications without problems: yes Muscle aches: no Diet compliance: yes Exercise: yes Labs d/w pt.    PMH and SH reviewed Meds, vitals, and allergies reviewed.   ROS: Per HPI.  Unless specifically indicated otherwise in HPI, the patient denies:  General: fever. Eyes: acute vision changes ENT: sore throat Cardiovascular: chest pain Respiratory: SOB GI: vomiting GU: dysuria Musculoskeletal: acute back pain Derm: acute rash Neuro: acute motor dysfunction Psych: worsening mood Endocrine: polydipsia Heme: bleeding Allergy: hayfever  GEN: nad, alert and oriented HEENT: mucous membranes moist NECK: supple w/o LA CV: rrr. PULM: ctab, no inc wob ABD: soft, +bs EXT: no edema SKIN: no acute rash but 0.8x1cm SK on the upper back.  This is not changed from prior. Right knee puffy but not red.

## 2022-10-28 DIAGNOSIS — N529 Male erectile dysfunction, unspecified: Secondary | ICD-10-CM | POA: Insufficient documentation

## 2022-10-28 NOTE — Assessment & Plan Note (Signed)
Continue amlodipine as is.  Continue work on diet and exercise.

## 2022-10-28 NOTE — Assessment & Plan Note (Signed)
Continue sildenafil as needed.  No nitroglycerin use.  Routine cautions given to patient.

## 2022-10-28 NOTE — Assessment & Plan Note (Signed)
We talked about cardiology eval/options given his vascular status.  We agreed to proceed with calcium scoring, d/w pt.   Continue Crestor as is.  Continue work on diet and exercise.

## 2022-10-28 NOTE — Assessment & Plan Note (Signed)
Living will d/w pt- wife designated if patient were incapacitated.   

## 2022-10-28 NOTE — Assessment & Plan Note (Signed)
Tetanus 2014 Shingrix prev done.  Flu prev done PNA 2019 covid 2021 Colonosocpy 2017.  Due 2024 per GI clinic.  D/w pt.   PSA wnl.  D/w pt.  FH noted.   Living will d/w pt- wife designated if patient were incapacitated.   Diet and exercise d/w pt.  Exercise as tolerated, diet is good.

## 2022-11-04 ENCOUNTER — Ambulatory Visit (HOSPITAL_COMMUNITY)
Admission: RE | Admit: 2022-11-04 | Discharge: 2022-11-04 | Disposition: A | Discharge status: Home / Self Care | Payer: Medicare Other | Source: Ambulatory Visit | Attending: Family Medicine | Admitting: Family Medicine

## 2022-11-04 DIAGNOSIS — E785 Hyperlipidemia, unspecified: Secondary | ICD-10-CM | POA: Insufficient documentation

## 2022-11-05 DIAGNOSIS — Z96651 Presence of right artificial knee joint: Secondary | ICD-10-CM | POA: Diagnosis not present

## 2022-11-05 DIAGNOSIS — T8484XA Pain due to internal orthopedic prosthetic devices, implants and grafts, initial encounter: Secondary | ICD-10-CM | POA: Diagnosis not present

## 2022-11-09 ENCOUNTER — Ambulatory Visit (INDEPENDENT_AMBULATORY_CARE_PROVIDER_SITE_OTHER): Payer: Medicare Other

## 2022-11-09 VITALS — Ht 70.0 in | Wt 210.0 lb

## 2022-11-09 DIAGNOSIS — Z0000 Encounter for general adult medical examination without abnormal findings: Secondary | ICD-10-CM

## 2022-11-09 DIAGNOSIS — Z1211 Encounter for screening for malignant neoplasm of colon: Secondary | ICD-10-CM

## 2022-11-09 NOTE — Patient Instructions (Signed)
Alejandro Schneider , Thank you for taking time to come for your Medicare Wellness Visit. I appreciate your ongoing commitment to your health goals. Please review the following plan we discussed and let me know if I can assist you in the future.   Referrals/Orders/Follow-Ups/Clinician Recommendations: Aim for 30 minutes of exercise or brisk walking, 6-8 glasses of water, and 5 servings of fruits and vegetables each day.   This is a list of the screening recommended for you and due dates:  Health Maintenance  Topic Date Due   DTaP/Tdap/Td vaccine (3 - Td or Tdap) 09/06/2022   Medicare Annual Wellness Visit  10/14/2022   Flu Shot  11/04/2022   Colon Cancer Screening  12/15/2022   COVID-19 Vaccine (5 - 2023-24 season) 11/02/2038*   Pneumonia Vaccine  Completed   Hepatitis C Screening  Completed   Zoster (Shingles) Vaccine  Completed   HPV Vaccine  Aged Out  *Topic was postponed. The date shown is not the original due date.    Advanced directives: (Copy Requested) Please bring a copy of your health care power of attorney and living will to the office to be added to your chart at your convenience.  Next Medicare Annual Wellness Visit scheduled for next year: Yes  Preventive Care 72 Years and Older, Male  Preventive care refers to lifestyle choices and visits with your health care provider that can promote health and wellness. What does preventive care include? A yearly physical exam. This is also called an annual well check. Dental exams once or twice a year. Routine eye exams. Ask your health care provider how often you should have your eyes checked. Personal lifestyle choices, including: Daily care of your teeth and gums. Regular physical activity. Eating a healthy diet. Avoiding tobacco and drug use. Limiting alcohol use. Practicing safe sex. Taking low doses of aspirin every day. Taking vitamin and mineral supplements as recommended by your health care provider. What happens during an  annual well check? The services and screenings done by your health care provider during your annual well check will depend on your age, overall health, lifestyle risk factors, and family history of disease. Counseling  Your health care provider may ask you questions about your: Alcohol use. Tobacco use. Drug use. Emotional well-being. Home and relationship well-being. Sexual activity. Eating habits. History of falls. Memory and ability to understand (cognition). Work and work Astronomer. Screening  You may have the following tests or measurements: Height, weight, and BMI. Blood pressure. Lipid and cholesterol levels. These may be checked every 5 years, or more frequently if you are over 72 years old. Skin check. Lung cancer screening. You may have this screening every year starting at age 21 if you have a 30-pack-year history of smoking and currently smoke or have quit within the past 15 years. Fecal occult blood test (FOBT) of the stool. You may have this test every year starting at age 16. Flexible sigmoidoscopy or colonoscopy. You may have a sigmoidoscopy every 5 years or a colonoscopy every 10 years starting at age 72. Prostate cancer screening. Recommendations will vary depending on your family history and other risks. Hepatitis C blood test. Hepatitis B blood test. Sexually transmitted disease (STD) testing. Diabetes screening. This is done by checking your blood sugar (glucose) after you have not eaten for a while (fasting). You may have this done every 1-3 years. Abdominal aortic aneurysm (AAA) screening. You may need this if you are a current or former smoker. Osteoporosis. You may be screened  starting at age 44 if you are at high risk. Talk with your health care provider about your test results, treatment options, and if necessary, the need for more tests. Vaccines  Your health care provider may recommend certain vaccines, such as: Influenza vaccine. This is recommended  every year. Tetanus, diphtheria, and acellular pertussis (Tdap, Td) vaccine. You may need a Td booster every 10 years. Zoster vaccine. You may need this after age 72. Pneumococcal 13-valent conjugate (PCV13) vaccine. One dose is recommended after age 64. Pneumococcal polysaccharide (PPSV23) vaccine. One dose is recommended after age 30. Talk to your health care provider about which screenings and vaccines you need and how often you need them. This information is not intended to replace advice given to you by your health care provider. Make sure you discuss any questions you have with your health care provider. Document Released: 04/18/2015 Document Revised: 12/10/2015 Document Reviewed: 01/21/2015 Elsevier Interactive Patient Education  2017 [employer].  Fall Prevention in the Home Falls can cause injuries. They can happen to people of all ages. There are many things you can do to make your home safe and to help prevent falls. What can I do on the outside of my home? Regularly fix the edges of walkways and driveways and fix any cracks. Remove anything that might make you trip as you walk through a door, such as a raised step or threshold. Trim any bushes or trees on the path to your home. Use bright outdoor lighting. Clear any walking paths of anything that might make someone trip, such as rocks or tools. Regularly check to see if handrails are loose or broken. Make sure that both sides of any steps have handrails. Any raised decks and porches should have guardrails on the edges. Have any leaves, snow, or ice cleared regularly. Use sand or salt on walking paths during winter. Clean up any spills in your garage right away. This includes oil or grease spills. What can I do in the bathroom? Use night lights. Install grab bars by the toilet and in the tub and shower. Do not use towel bars as grab bars. Use non-skid mats or decals in the tub or shower. If you need to sit down in the shower,  use a plastic, non-slip stool. Keep the floor dry. Clean up any water that spills on the floor as soon as it happens. Remove soap buildup in the tub or shower regularly. Attach bath mats securely with double-sided non-slip rug tape. Do not have throw rugs and other things on the floor that can make you trip. What can I do in the bedroom? Use night lights. Make sure that you have a light by your bed that is easy to reach. Do not use any sheets or blankets that are too big for your bed. They should not hang down onto the floor. Have a firm chair that has side arms. You can use this for support while you get dressed. Do not have throw rugs and other things on the floor that can make you trip. What can I do in the kitchen? Clean up any spills right away. Avoid walking on wet floors. Keep items that you use a lot in easy-to-reach places. If you need to reach something above you, use a strong step stool that has a grab bar. Keep electrical cords out of the way. Do not use floor polish or wax that makes floors slippery. If you must use wax, use non-skid floor wax. Do not have throw  rugs and other things on the floor that can make you trip. What can I do with my stairs? Do not leave any items on the stairs. Make sure that there are handrails on both sides of the stairs and use them. Fix handrails that are broken or loose. Make sure that handrails are as long as the stairways. Check any carpeting to make sure that it is firmly attached to the stairs. Fix any carpet that is loose or worn. Avoid having throw rugs at the top or bottom of the stairs. If you do have throw rugs, attach them to the floor with carpet tape. Make sure that you have a light switch at the top of the stairs and the bottom of the stairs. If you do not have them, ask someone to add them for you. What else can I do to help prevent falls? Wear shoes that: Do not have high heels. Have rubber bottoms. Are comfortable and fit you  well. Are closed at the toe. Do not wear sandals. If you use a stepladder: Make sure that it is fully opened. Do not climb a closed stepladder. Make sure that both sides of the stepladder are locked into place. Ask someone to hold it for you, if possible. Clearly mark and make sure that you can see: Any grab bars or handrails. First and last steps. Where the edge of each step is. Use tools that help you move around (mobility aids) if they are needed. These include: Canes. Walkers. Scooters. Crutches. Turn on the lights when you go into a dark area. Replace any light bulbs as soon as they burn out. Set up your furniture so you have a clear path. Avoid moving your furniture around. If any of your floors are uneven, fix them. If there are any pets around you, be aware of where they are. Review your medicines with your doctor. Some medicines can make you feel dizzy. This can increase your chance of falling. Ask your doctor what other things that you can do to help prevent falls. This information is not intended to replace advice given to you by your health care provider. Make sure you discuss any questions you have with your health care provider. Document Released: 01/16/2009 Document Revised: 08/28/2015 Document Reviewed: 04/26/2014 Elsevier Interactive Patient Education  2017 [employer].

## 2022-11-09 NOTE — Progress Notes (Signed)
Subjective:   Alejandro Schneider is a 72 y.o. male who presents for Medicare Annual/Subsequent preventive examination.  Visit Complete: Virtual  I connected with  Alejandro Schneider on 11/09/22 by a audio enabled telemedicine application and verified that I am speaking with the correct person using two identifiers.  Patient Location: Home  Provider Location: Office/Clinic  I discussed the limitations of evaluation and management by telemedicine. The patient expressed understanding and agreed to proceed.  Patient Medicare AWV questionnaire was completed by the patient on 11/08/22; I have confirmed that all information answered by patient is correct and no changes since this date.  Vital Signs: Unable to obtain new vitals due to this being a telehealth visit.   Review of Systems           Objective:    Today's Vitals   11/09/22 1256  Weight: 210 lb (95.3 kg)  Height: 5\' 10"  (1.778 m)   Body mass index is 30.13 kg/m.     11/09/2022    1:04 PM 10/13/2021    3:07 PM 10/09/2020    9:47 AM 09/27/2019    3:35 PM 09/19/2018    1:08 PM 09/19/2015    7:21 AM 12/17/2014    9:51 AM  Advanced Directives  Does Patient Have a Medical Advance Directive? Yes No Yes No No No No  Type of Estate agent of [place];Living will  Healthcare Power of [place];Living will      Copy of Healthcare Power of Attorney in Chart? No - copy requested  No - copy requested      Would patient like information on creating a medical advance directive?  No - Patient declined  Yes (MAU/Ambulatory/Procedural Areas - Information given) No - Patient declined No - patient declined information Yes - Educational materials given    Current Medications (verified) Outpatient Encounter Medications as of 11/09/2022  Medication Sig   amLODipine (NORVASC) 5 MG tablet TAKE ONE TABLET BY MOUTH ONE TIME DAILY   aspirin 81 MG chewable tablet CHEW ONE TABLET BY MOUTH TWICE A DAY FOR 30 DAYS   clopidogrel (PLAVIX)  75 MG tablet TAKE ONE TABLET BY MOUTH ONE TIME DAILY   clotrimazole-betamethasone (LOTRISONE) cream Apply 1 application topically 2 (two) times daily as needed.   Multiple Vitamins-Minerals (MULTIVITAMIN,TX-MINERALS) tablet Take 1 tablet by mouth daily.   Omega-3 Fatty Acids (FISH OIL) 1000 MG CAPS Take 1,000 mg by mouth daily.   rosuvastatin (CRESTOR) 10 MG tablet TAKE ONE TABLET BY MOUTH ONE TIME DAILY   sildenafil (REVATIO) 20 MG tablet Take 1-5 tablets (20-100 mg total) by mouth daily as needed.   No facility-administered encounter medications on file as of 11/09/2022.    Allergies (verified) Bee venom, Gabapentin, Lipitor [atorvastatin], and Simvastatin   History: Past Medical History:  Diagnosis Date   Allergy    CVA (cerebral infarction)    Hyperlipidemia    Hypertension    Internal hemorrhoids    Stroke [facility])    Past Surgical History:  Procedure Laterality Date   COLONOSCOPY  2006   INGUINAL HERNIA REPAIR  10/16/2002   Right  Dr Rickey Barbara LAMINECTOMY/DECOMPRESSION MICRODISCECTOMY Right 05/30/2012   Procedure: LUMBAR LAMINECTOMY/DECOMPRESSION MICRODISCECTOMY 1 LEVEL;  Surgeon: Reinaldo Meeker, MD;  Location: [facility] NEURO ORS;  Service: Neurosurgery;  Laterality: Right;   RADIOLOGY WITH ANESTHESIA N/A 05/21/2014   Procedure: ANGIOPLASTY POSSIBLE STENT;  Surgeon: Oneal Grout, MD;  Location: [facility] OR;  Service: Radiology;  Laterality: N/A;  TONSILLECTOMY  1972   TOTAL KNEE ARTHROPLASTY  03/08/2022   Family History  Problem Relation Age of Onset   Depression Mother    Heart disease Father        CABGx4   Cancer Father        Prostate (Radiation)   Prostate cancer Father    Prostate cancer Brother    Lung cancer Brother    Stroke Maternal Grandmother    Heart disease Paternal Grandfather        MI   Diabetes Cousin    Hypertension Neg Hx    Drug abuse Neg Hx    Alcohol abuse Neg Hx    Colon cancer Neg Hx    Esophageal cancer Neg Hx    Pancreatic cancer  Neg Hx    Rectal cancer Neg Hx    Stomach cancer Neg Hx    Social History   Socioeconomic History   Marital status: Married    Spouse name: Not on file   Number of children: 1   Years of education: Not on file   Highest education level: Not on file  Occupational History   Occupation: Lineman [employer]  Tobacco Use   Smoking status: Never   Smokeless tobacco: Never  Substance and Sexual Activity   Alcohol use: Not Currently    Comment: occasional   Drug use: No   Sexual activity: Yes  Other Topics Concern   Not on file  Social History Narrative   Married 1974   1 son, Systems developer (In [place]) FD   Retired Copywriter, 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 [employer]: Low Risk  (11/09/2022)   Overall Financial Resource Strain (CARDIA)    Difficulty of Paying Living Expenses: Not hard at all  Food Insecurity: No Food Insecurity (11/09/2022)   Hunger Vital Sign    Worried About Running Out of Food in the Last Year: Never true    Ran Out of Food in the Last Year: Never true  Transportation Needs: No Transportation Needs (11/09/2022)   PRAPARE - Administrator, Civil Service (Medical): No    Lack of Transportation (Non-Medical): No  Physical Activity: Sufficiently Active (11/09/2022)   Exercise Vital Sign    Days of Exercise per Week: 5 days    Minutes of Exercise per Session: 30 min  Stress: No Stress Concern Present (11/09/2022)   [employer] of Occupational Health - Occupational Stress Questionnaire    Feeling of Stress : Not at all  Social Connections: Socially Integrated (11/09/2022)   Social Connection and Isolation Panel [NHANES]    Frequency of Communication with Friends and Family: More than three times a week    Frequency of Social Gatherings with Friends and Family: More than three times a week    Attends Religious Services: More than 4 times per year    Active Member of [employer] or Organizations: Yes     Attends Engineer, structural: More than 4 times per year    Marital Status: Married    Tobacco Counseling Counseling given: Not Answered   Clinical Intake:  Pre-visit preparation completed: Yes  Pain : No/denies pain     BMI - recorded: 30.13 Nutritional Status: BMI > 30  Obese Nutritional Risks: None Diabetes: No  How often do you need to have someone help you when you read instructions, pamphlets, or other written materials from your doctor or pharmacy?: 1 -  Never  Interpreter Needed?: No  Information entered by :: C.Luretha Eberly LPN   Activities of Daily Living    11/08/2022    5:23 PM  In your present state of health, do you have any difficulty performing the following activities:  Hearing? 0  Vision? 0  Difficulty concentrating or making decisions? 0  Walking or climbing stairs? 0  Dressing or bathing? 0  Doing errands, shopping? 0  Preparing Food and eating ? N  Using the Toilet? N  In the past six months, have you accidently leaked urine? N  Do you have problems with loss of bowel control? N  Managing your Medications? N  Managing your Finances? N  Housekeeping or managing your Housekeeping? N    Patient Care Team: Joaquim Nam, MD as PCP - General (Family Medicine) Crista Elliot, MD as Consulting Physician (Urology)  Indicate any recent Medical Services you may have received from other than [facility] providers in the past year (date may be approximate).     Assessment:   This is a routine wellness examination for [place].  Hearing/Vision screen Hearing Screening - Comments:: Denies hearing difficulties   Vision Screening - Comments:: Readers - [facility] - UTD on eye exams  Dietary issues and exercise activities discussed:     Goals Addressed             This Visit's Progress    Patient Stated       Stay healthy       Depression Screen    11/09/2022    1:03 PM 10/26/2022   12:27 PM 10/13/2021    3:05 PM 10/09/2020    9:48 AM  09/27/2019    3:36 PM 09/19/2018    1:05 PM 10/19/2017    5:45 PM  PHQ 2/9 Scores  PHQ - 2 Score 0 0 0 0 0 0 0  PHQ- 9 Score 0 0 0 0 0 0     Fall Risk    11/08/2022    5:23 PM 10/26/2022   12:27 PM 05/06/2022    9:22 AM 10/13/2021    3:07 PM 10/09/2020    9:47 AM  Fall Risk   Falls in the past year? 0 0 0 0 0  Number falls in past yr: 0 0 0 0 0  Injury with Fall? 0 0 0 0 0  Risk for fall due to : No Fall Risks No Fall Risks No Fall Risks No Fall Risks Medication side effect  Follow up Falls evaluation completed;Falls prevention discussed Falls evaluation completed Falls evaluation completed Falls evaluation completed Falls prevention discussed;Falls evaluation completed    MEDICARE RISK AT HOME:   TIMED UP AND GO:  Was the test performed?  No    Cognitive Function:    10/09/2020    9:48 AM 09/27/2019    3:38 PM 09/19/2018    1:08 PM  MMSE - Mini Mental State Exam  Not completed: Refused Refused   Orientation to time   5  Orientation to Place   5  Registration   3  Attention/ Calculation   0  Recall   3  Language- name 2 objects   0  Language- repeat   1  Language- follow 3 step command   0  Language- read & follow direction   0  Write a sentence   0  Copy design   0  Total score   17        11/09/2022  1:04 PM 10/13/2021    3:09 PM  6CIT Screen  What Year? 0 points 0 points  What month? 0 points 0 points  What time? 0 points 0 points  Count back from 20 0 points 0 points  Months in reverse 0 points 0 points  Repeat phrase 0 points 0 points  Total Score 0 points 0 points    Immunizations Immunization History  Administered Date(s) Administered   Influenza, High Dose Seasonal PF 02/01/2017, 01/14/2020   Influenza,inj,Quad PF,6+ Mos 02/19/2022   Influenza-Unspecified 02/01/2014, 01/19/2019, 01/28/2021   Moderna Sars-Covid-2 Vaccination 04/10/2019, 05/07/2019, 02/05/2020, 01/13/2021   Pneumococcal Conjugate-13 10/01/2016   Pneumococcal Polysaccharide-23  10/19/2017   Td 04/05/2002   Tdap 09/05/2012   Zoster Recombinant(Shingrix) 01/14/2020, 07/11/2020   Zoster, Live 09/06/2013    TDAP status: Due, Education has been provided regarding the importance of this vaccine. Advised may receive this vaccine at local pharmacy or Health Dept. Aware to provide a copy of the vaccination record if obtained from local pharmacy or Health Dept. Verbalized acceptance and understanding.  Flu Vaccine status: Due, Education has been provided regarding the importance of this vaccine. Advised may receive this vaccine at local pharmacy or Health Dept. Aware to provide a copy of the vaccination record if obtained from local pharmacy or Health Dept. Verbalized acceptance and understanding.  Pneumococcal vaccine status: Up to date  Covid-19 vaccine status: Information provided on how to obtain vaccines.   Qualifies for Shingles Vaccine? Yes   Zostavax completed No   Shingrix Completed?: Yes  Screening Tests Health Maintenance  Topic Date Due   DTaP/Tdap/Td (3 - Td or Tdap) 09/06/2022   INFLUENZA VACCINE  11/04/2022   Colonoscopy  12/15/2022   COVID-19 Vaccine (5 - 2023-24 season) 11/02/2038 (Originally 12/04/2021)   Medicare Annual Wellness (AWV)  11/09/2023   Pneumonia Vaccine 75+ Years old  Completed   Hepatitis C Screening  Completed   Zoster Vaccines- Shingrix  Completed   HPV VACCINES  Aged Out    Health Maintenance  Health Maintenance Due  Topic Date Due   DTaP/Tdap/Td (3 - Td or Tdap) 09/06/2022   INFLUENZA VACCINE  11/04/2022   Colonoscopy  12/15/2022    Colorectal cancer screening: Referral to GI placed 11/09/22. Pt aware the office will call re: appt.  Lung Cancer Screening: (Low Dose CT Chest recommended if Age 74-80 years, 20 pack-year currently smoking OR have quit w/in 15years.) does not qualify.   Lung Cancer Screening Referral: no  Additional Screening:  Hepatitis C Screening: does qualify; Completed 09/23/15  Vision Screening:  Recommended annual ophthalmology exams for early detection of glaucoma and other disorders of the eye. Is the patient up to date with their annual eye exam?  Yes  Who is the provider or what is the name of the office in which the patient attends annual eye exams? [facility] If pt is not established with a provider, would they like to be referred to a provider to establish care? Yes .   Dental Screening: Recommended annual dental exams for proper oral hygiene    Community Resource Referral / Chronic Care Management: CRR required this visit?  No   CCM required this visit?  No     Plan:     I have personally reviewed and noted the following in the patient's chart:   Medical and social history Use of alcohol, tobacco or illicit drugs  Current medications and supplements including opioid prescriptions. Patient is not currently taking opioid prescriptions. Functional ability  and status Nutritional status Physical activity Advanced directives List of other physicians Hospitalizations, surgeries, and ER visits in previous 12 months Vitals Screenings to include cognitive, depression, and falls Referrals and appointments  In addition, I have reviewed and discussed with patient certain preventive protocols, quality metrics, and best practice recommendations. A written personalized care plan for preventive services as well as general preventive health recommendations were provided to patient.     Maryan Puls, LPN   10/11/2954   After Visit Summary: (MyChart) Due to this being a telephonic visit, the after visit summary with patients personalized plan was offered to patient via MyChart   Nurse Notes: none

## 2022-11-10 ENCOUNTER — Other Ambulatory Visit: Payer: Self-pay | Admitting: Family Medicine

## 2022-11-10 DIAGNOSIS — I2510 Atherosclerotic heart disease of native coronary artery without angina pectoris: Secondary | ICD-10-CM | POA: Insufficient documentation

## 2022-11-11 ENCOUNTER — Telehealth: Payer: Self-pay

## 2022-11-11 NOTE — Telephone Encounter (Signed)
LVM to CB in regards to cardiac CT

## 2022-11-12 NOTE — Progress Notes (Signed)
Cardiology Office Note:   Date:  11/19/2022  ID:  Alejandro Schneider, DOB 1951/01/16, MRN [id number] PCP:  Joaquim Nam, MD  [facility] Providers Cardiologist:  Alverda Skeans, MD Referring MD: Joaquim Nam, MD   Chief Complaint/Reason for Referral: Coronary artery calcification ASSESSMENT:    1. Coronary artery calcification seen on CAT scan   2. 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 Hyperlipidemia LDL goal <55   4. Aortic atherosclerosis (HCC)   5. BMI 31.0-31.9,adult   6. Essential hypertension     PLAN:   In order of problems listed above: 1.  Coronary artery calcification: Continue aspirin, rosuvastatin, and tight blood pressure control.  Will reach out to IR Dr. Corliss Skains about stopping plavix. 2.  History of stroke: Continue aspirin, statin and tight blood pressure control. 3.  Hyperlipidemia: Recent lipid panel showed an LDL of greater than 55; given his history of stroke his LDL goal is less than 55.  Increase Crestor to 20 mg and check lipid panel and LFTs as well as LP(a) in 2 months.  His LDL last month was 63. 4.  Aortic atherosclerosis: Continue Plavix, statin, and tight blood pressure control. 5.  BMI 31: Continue diet and exercise. 6.  Hypertension: Increase amlodipine to 10 mg.             Dispo:  Return in about 6 months (around 05/22/2023).      Medication Adjustments/Labs and Tests Ordered: Current medicines are reviewed at length with the patient today.  Concerns regarding medicines are outlined above.  The following changes have been made:     Labs/tests ordered: Orders Placed This Encounter  Procedures   Lipid panel   Lipoprotein A (LPA)   Hepatic function panel   EKG 12-Lead    Medication Changes: Meds ordered this encounter  Medications   rosuvastatin (CRESTOR) 20 MG tablet    Sig: Take 1 tablet (20 mg total) by mouth daily.    Dispense:  90 tablet    Refill:  3    Dose increase    amLODipine (NORVASC) 10 MG tablet    Sig: Take 1 tablet (10 mg total) by mouth daily.    Dispense:  90 tablet    Refill:  3    Dose increase    Current medicines are reviewed at length with the patient today.  The patient does not have concerns regarding medicines.  History of Present Illness:   FOCUSED PROBLEM LIST:   Elevated coronary calcium on calcium score CT 2024 Aortic atherosclerosis on calcium score CT 2024 Stroke 2017 at the age of 30 with occluded left vertebral artery Hyperlipidemia with intolerance to Lipitor and simvastatin BMI 31  The patient is a 72 y.o. male with the indicated medical history here to establish cardiovascular care.  The patient had a calcium score CT which showed elevated coronary calcium.  He also has a history of stroke in 2017.  The patient is a retired Copywriter, advertising for [employer].  He is here with his wife.  He is doing very well.  He denies any exertional dyspnea, exertional angina, presyncope or syncope.  He has fortunately had no recurrent signs or symptoms of stroke.  He is very compliant with his medical therapy.  Interestingly he still remains on Plavix despite the fact his stroke was in 2017.  He is tolerating Crestor without issues.  He is otherwise well and without significant complaints.  Previous Medical History: Past Medical History:  Diagnosis Date   Allergy    CVA (cerebral infarction)    Hyperlipidemia    Hypertension    Internal hemorrhoids    Stroke (HCC)      Current Medications: Current Meds  Medication Sig   amLODipine (NORVASC) 10 MG tablet Take 1 tablet (10 mg total) by mouth daily.   aspirin 81 MG chewable tablet Chew 81 mg by mouth daily.   clopidogrel (PLAVIX) 75 MG tablet TAKE ONE TABLET BY MOUTH ONE TIME DAILY   clotrimazole-betamethasone (LOTRISONE) cream Apply 1 application topically 2 (two) times daily as needed.   Multiple Vitamins-Minerals (MULTIVITAMIN,TX-MINERALS) tablet Take 1 tablet by mouth daily.    Omega-3 Fatty Acids (FISH OIL) 1000 MG CAPS Take 1,000 mg by mouth daily.   rosuvastatin (CRESTOR) 20 MG tablet Take 1 tablet (20 mg total) by mouth daily.   sildenafil (REVATIO) 20 MG tablet Take 1-5 tablets (20-100 mg total) by mouth daily as needed.   [DISCONTINUED] amLODipine (NORVASC) 5 MG tablet TAKE ONE TABLET BY MOUTH ONE TIME DAILY   [DISCONTINUED] rosuvastatin (CRESTOR) 10 MG tablet TAKE ONE TABLET BY MOUTH ONE TIME DAILY     Allergies:    Bee venom, Gabapentin, Lipitor [atorvastatin], and Simvastatin   Social History:   Social History   Tobacco Use   Smoking status: Never   Smokeless tobacco: Never  Substance Use Topics   Alcohol use: Not Currently    Comment: occasional   Drug use: No     Family Hx: Family History  Problem Relation Age of Onset   Depression Mother    Heart disease Father        CABGx4   Cancer Father        Prostate (Radiation)   Prostate cancer Father    Prostate cancer Brother    Lung cancer Brother    Stroke Maternal Grandmother    Heart disease Paternal Grandfather        MI   Diabetes Cousin    Hypertension Neg Hx    Drug abuse Neg Hx    Alcohol abuse Neg Hx    Colon cancer Neg Hx    Esophageal cancer Neg Hx    Pancreatic cancer Neg Hx    Rectal cancer Neg Hx    Stomach cancer Neg Hx      Review of Systems:   Please see the history of present illness.    All other systems reviewed and are negative.     EKGs/Labs/Other Test Reviewed:   EKG:    EKG Interpretation Date/Time:  Friday November 19 2022 08:55:28 EDT Ventricular Rate:  56 PR Interval:  144 QRS Duration:  98 QT Interval:  420 QTC Calculation: 405 R Axis:   -25  Text Interpretation: Sinus bradycardia Minimal voltage criteria for LVH, may be normal variant ( R in aVL ) When compared with ECG of 20-May-2014 03:22, PREVIOUS ECG IS PRESENT Confirmed by Alverda Skeans ([id number]) on 11/19/2022 8:56:08 AM        Prior CV studies reviewed: Cardiac Studies & Procedures           CT SCANS  CT CARDIAC SCORING (SELF PAY ONLY) 11/04/2022  Addendum 11/11/2022  4:41 PM ADDENDUM REPORT: 11/11/2022 16:38  EXAM: OVER-READ INTERPRETATION  PET-CT CHEST  The following report is an over-read performed by radiologist Dr. Leatha [facility] on 11/11/2022. This over-read does not include interpretation of cardiac or coronary anatomy or pathology. The cardiac CT  interpretation by the cardiologist is to be attached.  COMPARISON:  No comparison studies available.  FINDINGS: 10 mm short axis subcarinal lymph node is upper normal, likely reactive.  The visualized lung parenchyma shows no suspicious pulmonary nodule or mass. No focal airspace consolidation. No effusion.  The liver shows diffusely decreased attenuation suggesting fat deposition.  No suspicious lytic or sclerotic osseous abnormality.  IMPRESSION: Hepatic steatosis.   Electronically Signed By: Kennith Center M.D. On: 11/11/2022 16:38  Narrative CLINICAL DATA:  Cardiovascular Disease Risk stratification  EXAM: Coronary Calcium Score  TECHNIQUE: A gated, non-contrast computed tomography scan of the heart was performed using 3 mm slice thickness. Axial images were analyzed on a dedicated workstation. Calcium scoring of the coronary arteries was performed using the Agatston method.  FINDINGS: Coronary arteries: Normal origins.  Coronary Calcium Score:  Left main: 0  Left anterior descending artery: 363  Left circumflex artery: 12.5  Right coronary artery: 297  Total: 672  Percentile: 77th  Pericardium: Normal.  Aorta: Normal caliber.  Aortic atherosclerosis.  Non-cardiac: See separate report from [facility].  IMPRESSION: 1. Coronary calcium score of 672. This was 77th percentile for age-, race-, and sex-matched controls. 2. Aortic atherosclerosis.  RECOMMENDATIONS: Coronary artery calcium (CAC) score is a strong predictor of incident coronary  heart disease (CHD) and provides predictive information beyond traditional risk factors. CAC scoring is reasonable to use in the decision to withhold, postpone, or initiate statin therapy in intermediate-risk or selected borderline-risk asymptomatic adults (age 49-75 years and LDL-C >=70 to <190 mg/dL) who do not have diabetes or established atherosclerotic cardiovascular disease (ASCVD).* In intermediate-risk (10-year ASCVD risk >=7.5% to <20%) adults or selected borderline-risk (10-year ASCVD risk >=5% to <7.5%) adults in whom a CAC score is measured for the purpose of making a treatment decision the following recommendations have been made:  If CAC=0, it is reasonable to withhold statin therapy and reassess in 5 to 10 years, as long as higher risk conditions are absent (diabetes mellitus, family history of premature CHD in first degree relatives (males <55 years; females <65 years), cigarette smoking, or LDL >=190 mg/dL).  If CAC is 1 to 99, it is reasonable to initiate statin therapy for patients >=35 years of age.  If CAC is >=100 or >=75th percentile, it is reasonable to initiate statin therapy at any age.  Cardiology referral should be considered for patients with CAC scores >=400 or >=75th percentile.  *2018 AHA/ACC/AACVPR/AAPA/ABC/ACPM/ADA/AGS/APhA/ASPC/NLA/PCNA Guideline on the Management of Blood Cholesterol: A Report of the American College of Cardiology/American Heart Association Task Force on Clinical Practice Guidelines. J Am Coll Cardiol. 2019;73(24):3168-3209.  Zoila Shutter, MD  Electronically Signed: By: Chrystie Nose M.D. On: 11/04/2022 21:59          Recent Labs: 10/20/2022: ALT 22; BUN 13; Creatinine, Ser 0.83; Hemoglobin 14.9; Platelets 181.0; Potassium 4.4; Sodium 139; TSH 1.86   Lipid Panel    Component Value Date/Time   CHOL 135 10/20/2022 1451   TRIG 153.0 (H) 10/20/2022 1451   HDL 40.90 10/20/2022 1451   CHOLHDL 3 10/20/2022 1451   VLDL  30.6 10/20/2022 1451   LDLCALC 63 10/20/2022 1451   LDLDIRECT 70.0 09/23/2015 0737    Risk Assessment/Calculations:          Physical Exam:   VS:  BP (!) 145/80   Pulse (!) 56   Ht 5\' 10"  (1.778 m)   Wt 217 lb 9.6 oz (98.7 kg)   SpO2 96%   BMI 31.22 kg/m  HYPERTENSION CONTROL Vitals:   11/19/22 0858 11/19/22 0921  BP: (!) 156/72 (!) 145/80    The patient's blood pressure is elevated above target today.  In order to address the patient's elevated BP: A current anti-hypertensive medication was adjusted today.      Wt Readings from Last 3 Encounters:  11/19/22 217 lb 9.6 oz (98.7 kg)  11/09/22 210 lb (95.3 kg)  10/26/22 216 lb (98 kg)      GENERAL:  No apparent distress, AOx3 HEENT:  No carotid bruits, +2 carotid impulses, no scleral icterus CAR: RRR no murmurs, gallops, rubs, or thrills RES:  Clear to auscultation bilaterally ABD:  Soft, nontender, nondistended, positive bowel sounds x 4 VASC:  +2 radial pulses, +2 carotid pulses NEURO:  CN 2-12 grossly intact; motor and sensory grossly intact PSYCH:  No active depression or anxiety EXT:  No edema, ecchymosis, or cyanosis  Signed, Orbie Pyo, MD  11/19/2022 9:23 AM    [facility] [address] [place], [place], [place]  [address] Phone: [phone]; Fax: [phone]   Note:  This document was prepared using Dragon voice recognition software and may include unintentional dictation errors.

## 2022-11-18 ENCOUNTER — Encounter: Payer: Self-pay | Admitting: Physician Assistant

## 2022-11-19 ENCOUNTER — Ambulatory Visit: Payer: Medicare Other | Attending: Internal Medicine | Admitting: Internal Medicine

## 2022-11-19 ENCOUNTER — Encounter: Payer: Self-pay | Admitting: Internal Medicine

## 2022-11-19 VITALS — BP 145/80 | HR 56 | Ht 70.0 in | Wt 217.6 lb

## 2022-11-19 DIAGNOSIS — E785 Hyperlipidemia, unspecified: Secondary | ICD-10-CM

## 2022-11-19 DIAGNOSIS — I63011 Cerebral infarction due to thrombosis of right vertebral artery: Secondary | ICD-10-CM | POA: Diagnosis not present

## 2022-11-19 DIAGNOSIS — I2510 Atherosclerotic heart disease of native coronary artery without angina pectoris: Secondary | ICD-10-CM | POA: Diagnosis not present

## 2022-11-19 DIAGNOSIS — I700 Atherosclerosis of aorta: Secondary | ICD-10-CM

## 2022-11-19 DIAGNOSIS — Z6831 Body mass index (BMI) 31.0-31.9, adult: Secondary | ICD-10-CM

## 2022-11-19 DIAGNOSIS — I10 Essential (primary) hypertension: Secondary | ICD-10-CM

## 2022-11-19 MED ORDER — AMLODIPINE BESYLATE 10 MG PO TABS: 10.0000 mg | ORAL_TABLET | Freq: Every day | ORAL | 3 refills | Status: DC

## 2022-11-19 MED ORDER — ROSUVASTATIN CALCIUM 20 MG PO TABS: 20.0000 mg | ORAL_TABLET | Freq: Every day | ORAL | 3 refills | Status: DC

## 2022-11-19 NOTE — Patient Instructions (Signed)
Medication Instructions:  Your physician has recommended you make the following change in your medication:  1.) increase amlodipine to 10 mg - one tablet daily 2.) increase rosuvastatin (Crestor) to 20 mg - one tablet daily  *If you need a refill on your cardiac medications before your next appointment, please call your pharmacy*   Lab Work: Please return in 8 weeks for lipids, liver function, Lp(a)  If you have labs (blood work) drawn today and your tests are completely normal, you will receive your results only by: MyChart Message (if you have MyChart) OR A paper copy in the mail If you have any lab test that is abnormal or we need to change your treatment, we will call you to review the results.   Testing/Procedures: none   Follow-Up: At [facility], you and your health needs are our priority.  As part of our continuing mission to provide you with exceptional heart care, we have created designated Provider Care Teams.  These Care Teams include your primary Cardiologist (physician) and Advanced Practice Providers (APPs -  Physician Assistants and Nurse Practitioners) who all work together to provide you with the care you need, when you need it.   Your next appointment:   6 month(s)  Provider:   Jari Favre, PA-C, Ronie Spies, PA-C, Robin Searing, NP, Jacolyn Reedy, PA-C, Eligha Bridegroom, NP, Tereso Newcomer, PA-C, or Perlie Gold, PA-C

## 2022-12-15 ENCOUNTER — Other Ambulatory Visit (HOSPITAL_COMMUNITY): Payer: Self-pay | Admitting: Interventional Radiology

## 2022-12-15 DIAGNOSIS — I671 Cerebral aneurysm, nonruptured: Secondary | ICD-10-CM

## 2022-12-24 ENCOUNTER — Ambulatory Visit (HOSPITAL_COMMUNITY)
Admission: RE | Admit: 2022-12-24 | Discharge: 2022-12-24 | Disposition: A | Discharge status: Home / Self Care | Payer: Medicare Other | Source: Ambulatory Visit | Attending: Interventional Radiology | Admitting: Interventional Radiology

## 2022-12-24 DIAGNOSIS — I671 Cerebral aneurysm, nonruptured: Secondary | ICD-10-CM | POA: Insufficient documentation

## 2022-12-24 DIAGNOSIS — I651 Occlusion and stenosis of basilar artery: Secondary | ICD-10-CM | POA: Diagnosis not present

## 2022-12-24 DIAGNOSIS — I6502 Occlusion and stenosis of left vertebral artery: Secondary | ICD-10-CM | POA: Diagnosis not present

## 2022-12-29 DIAGNOSIS — K080 Exfoliation of teeth due to systemic causes: Secondary | ICD-10-CM | POA: Diagnosis not present

## 2023-01-02 ENCOUNTER — Other Ambulatory Visit: Payer: Self-pay | Admitting: Family Medicine

## 2023-01-05 DIAGNOSIS — T8484XA Pain due to internal orthopedic prosthetic devices, implants and grafts, initial encounter: Secondary | ICD-10-CM | POA: Diagnosis not present

## 2023-01-05 DIAGNOSIS — Z96651 Presence of right artificial knee joint: Secondary | ICD-10-CM | POA: Diagnosis not present

## 2023-01-12 DIAGNOSIS — K080 Exfoliation of teeth due to systemic causes: Secondary | ICD-10-CM | POA: Diagnosis not present

## 2023-01-13 DIAGNOSIS — D3132 Benign neoplasm of left choroid: Secondary | ICD-10-CM | POA: Diagnosis not present

## 2023-01-13 DIAGNOSIS — H524 Presbyopia: Secondary | ICD-10-CM | POA: Diagnosis not present

## 2023-01-14 ENCOUNTER — Ambulatory Visit: Payer: Medicare Other | Attending: Internal Medicine

## 2023-01-14 DIAGNOSIS — E785 Hyperlipidemia, unspecified: Secondary | ICD-10-CM | POA: Diagnosis not present

## 2023-01-14 DIAGNOSIS — I700 Atherosclerosis of aorta: Secondary | ICD-10-CM

## 2023-01-15 LAB — LIPID PANEL
Chol/HDL Ratio: 2.5 {ratio} (ref 0.0–5.0)
Cholesterol, Total: 112 mg/dL (ref 100–199)
HDL: 44 mg/dL (ref >39)
LDL Chol Calc (NIH): 53 mg/dL (ref 0–99)
Triglycerides: 69 mg/dL (ref 0–149)
VLDL Cholesterol Cal: 15 mg/dL (ref 5–40)

## 2023-01-15 LAB — LIPOPROTEIN A (LPA): Lipoprotein (a): <8.4 nmol/L (ref <75.0)

## 2023-01-15 LAB — HEPATIC FUNCTION PANEL
ALT: 24 [IU]/L (ref 0–44)
AST: 24 [IU]/L (ref 0–40)
Albumin: 4.4 g/dL (ref 3.8–4.8)
Alkaline Phosphatase: 76 [IU]/L (ref 44–121)
Bilirubin Total: 0.6 mg/dL (ref 0.0–1.2)
Bilirubin, Direct: 0.18 mg/dL (ref 0.00–0.40)
Total Protein: 6.4 g/dL (ref 6.0–8.5)

## 2023-01-17 ENCOUNTER — Encounter: Payer: Self-pay | Admitting: Radiology

## 2023-01-17 ENCOUNTER — Telehealth (HOSPITAL_COMMUNITY): Payer: Self-pay

## 2023-01-17 NOTE — Progress Notes (Unsigned)
Patient ID: Alejandro Schneider, male   DOB: 10-07-1950, 72 y.o.   MRN: [id number]  Request for evaluation for cessation of Plavix from cardiology   72 y.o. male known to IR. 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 Per Dr. Julieanne Cotton recommend the patient get a p2y12. If the patient is a non responder than they can stop the plavix. If he is a responder then  recommend that they patient stay on both the Plavix and the ASA due to the severity of the disease. Ultimately however will allow cardiology to make the final decision.

## 2023-01-17 NOTE — Telephone Encounter (Signed)
Pt agreed to f/u in 1 year with an MRA. He is wanting to know if he should stop his blood thinner. I've sent a message to our neuro PA today to advise. AB

## 2023-01-17 NOTE — Telephone Encounter (Signed)
Pt will come get a p2y12 on 01/19/23 to find out if he can d/c the Plavix per Dr. Corliss Skains. AB

## 2023-01-19 ENCOUNTER — Other Ambulatory Visit (HOSPITAL_COMMUNITY): Payer: Self-pay | Admitting: Radiology

## 2023-01-19 ENCOUNTER — Other Ambulatory Visit (HOSPITAL_COMMUNITY): Payer: Self-pay

## 2023-01-19 ENCOUNTER — Other Ambulatory Visit (HOSPITAL_COMMUNITY)
Admission: RE | Admit: 2023-01-19 | Discharge: 2023-01-19 | Disposition: A | Discharge status: Home / Self Care | Payer: Medicare Other | Source: Ambulatory Visit | Attending: Interventional Radiology | Admitting: Interventional Radiology

## 2023-01-19 DIAGNOSIS — I729 Aneurysm of unspecified site: Secondary | ICD-10-CM

## 2023-01-19 DIAGNOSIS — I651 Occlusion and stenosis of basilar artery: Secondary | ICD-10-CM

## 2023-02-01 NOTE — Progress Notes (Unsigned)
02/02/2023 Alejandro Schneider [id number] [birth year]  Referring provider: Joaquim Nam, MD Primary GI doctor: Dr. Russella Dar  ASSESSMENT AND PLAN:   Colon Polyps History of two small polyps removed in 2017. No current GI symptoms. Due for repeat colonoscopy this year based on updated guidelines. No current GI symptoms. Regular bowel movements. No history of daily alcohol use. -Advise patient to contact office if any changes in bowel habits, reflux, or trouble swallowing occur.  -Schedule colonoscopy with Dr. Russella Dar at [facility] We have discussed the risks of bleeding, infection, perforation, medication reactions, and remote risk of death associated with colonoscopy. All questions were answered and the patient acknowledges these risk and wishes to proceed.  Cerebral Aneurysm History of left aneurysm with posterior circulation deficiency in 2016. No repair done, currently being monitored every six months. On Plavix and aspirin since 2016. Pending platelet inhibition test results to determine if Plavix can be discontinued. -Continue current management. Await results of platelet inhibition test.  Anticoagulation Management On Plavix for cerebral aneurysm. Pending test results to determine if Plavix can be discontinued. If still on Plavix at time of colonoscopy, will need to hold for 5 days prior to procedure. -Obtain permission from Dr. Corliss Skains to hold Plavix for 5 days prior to colonoscopy if still on it.   Patient Care Team: Joaquim Nam, MD as PCP - General (Family Medicine) Orbie Pyo, MD as PCP - Cardiology (Cardiology) Crista Elliot, MD as Consulting Physician (Urology)  HISTORY OF PRESENT ILLNESS: 72 y.o. male with a past medical history of CAD, CVA right vetebral artery, chol, HTN and others listed below presents for evaluation of colonoscopy on Plavix..   12/15/2015 colonoscopy with Dr. Russella Dar for screening purposes, excellent prep 5 mm polyp ascending colon, 6 mm  polyp transverse colon recall 7 years. 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   Follows with Dr. Titus Dubin Cardio has requested cessation of Plavix pending P2 Y12 per Dr. Titus Dubin for recommendation.  If patient is nonresponder can stop Plavix if he is responder recommend staying on both Plavix and aspirin.  Discussed the use of AI scribe software for clinical note transcription with the patient, who gave verbal consent to proceed.  The patient, with a history of left aneurysm and posterior circulation deficiency diagnosed in 2016, has been on Plavix and aspirin since the diagnosis. The aneurysm has not been repaired, and the patient has been undergoing check-ups every six months.  In 2017, the patient underwent a colonoscopy, during which two small polyps were found. The patient had excellent prep for the procedure.  The patient has a history of plaque in the arteries but has never experienced a heart attack or any related symptoms such as chest pain or shortness of breath.  Regarding gastrointestinal symptoms, the patient denies any issues. He has regular bowel movements without straining or diarrhea, and there is no evidence of dark black stool or blood in the stool. The patient also denies any upper GI symptoms such as heartburn, nausea, vomiting, or trouble swallowing.   He  reports that he has never smoked. He has never used smokeless tobacco. He reports that he does not currently use alcohol. He reports that he does not use drugs.  RELEVANT LABS AND IMAGING:  Results   DIAGNOSTIC Colonoscopy: Two small polyps (2017)      CBC    Component Value Date/Time   WBC 6.0 10/20/2022 1451  RBC 4.82 10/20/2022 1451   HGB 14.9 10/20/2022 1451   HCT 44.4 10/20/2022 1451   PLT 181.0 10/20/2022 1451   MCV 92.2 10/20/2022 1451   MCH 29.7 09/19/2015 0700   MCHC 33.5 10/20/2022 1451   RDW 14.6 10/20/2022 1451   LYMPHSABS 1.2  10/20/2022 1451   MONOABS 0.7 10/20/2022 1451   EOSABS 0.5 10/20/2022 1451   BASOSABS 0.1 10/20/2022 1451   Recent Labs    10/20/22 1451  HGB 14.9    CMP     Component Value Date/Time   NA 139 10/20/2022 1451   K 4.4 10/20/2022 1451   CL 104 10/20/2022 1451   CO2 28 10/20/2022 1451   GLUCOSE 134 (H) 10/20/2022 1451   BUN 13 10/20/2022 1451   CREATININE 0.83 10/20/2022 1451   CALCIUM 9.0 10/20/2022 1451   PROT 6.4 01/14/2023 0905   ALBUMIN 4.4 01/14/2023 0905   AST 24 01/14/2023 0905   ALT 24 01/14/2023 0905   ALKPHOS 76 01/14/2023 0905   BILITOT 0.6 01/14/2023 0905   GFRNONAA >60 04/28/2016 1547   GFRAA >60 04/28/2016 1547      Latest Ref Rng & Units 01/14/2023    9:05 AM 10/20/2022    2:51 PM 10/13/2021    7:32 AM  Hepatic Function  Total Protein 6.0 - 8.5 g/dL 6.4  6.3  6.5   Albumin 3.8 - 4.8 g/dL 4.4  4.2  4.5   AST 0 - 40 IU/L 24  16  18    ALT 0 - 44 IU/L 24  22  23    Alk Phosphatase 44 - 121 IU/L 76  75  71   Total Bilirubin 0.0 - 1.2 mg/dL 0.6  0.8  0.9   Bilirubin, Direct 0.00 - 0.40 mg/dL 1.61         Current Medications:    Current Outpatient Medications (Cardiovascular):    amLODipine (NORVASC) 10 MG tablet, Take 1 tablet (10 mg total) by mouth daily.   rosuvastatin (CRESTOR) 20 MG tablet, Take 1 tablet (20 mg total) by mouth daily.   sildenafil (REVATIO) 20 MG tablet, Take 1-5 tablets (20-100 mg total) by mouth daily as needed.   Current Outpatient Medications (Analgesics):    aspirin 81 MG chewable tablet, Chew 81 mg by mouth daily.  Current Outpatient Medications (Hematological):    clopidogrel (PLAVIX) 75 MG tablet, TAKE ONE TABLET BY MOUTH ONE TIME DAILY  Current Outpatient Medications (Other):    clotrimazole-betamethasone (LOTRISONE) cream, Apply 1 application topically 2 (two) times daily as needed.   Multiple Vitamins-Minerals (MULTIVITAMIN,TX-MINERALS) tablet, Take 1 tablet by mouth daily.   Omega-3 Fatty Acids (FISH OIL) 1000 MG  CAPS, Take 1,000 mg by mouth daily.  Medical History:  Past Medical History:  Diagnosis Date   Allergy    CVA (cerebral infarction)    Hyperlipidemia    Hypertension    Internal hemorrhoids    Stroke [facility])    Allergies:  Allergies  Allergen Reactions   Bee Venom     S/p allergy shots (and no reaction with stings after shots)   Gabapentin     Nausea, altered mental status   Lipitor [Atorvastatin] Other (See Comments)    myalgias   Simvastatin Other (See Comments)    aches     Surgical History:  He  has a past surgical history that includes Tonsillectomy (1972); Inguinal hernia repair (10/16/2002); Colonoscopy (2006); Lumbar laminectomy/decompression microdiscectomy (Right, 05/30/2012); Radiology with anesthesia (N/A, 05/21/2014); and Total knee arthroplasty (03/08/2022). Family  History:  His family history includes Cancer in his father; Depression in his mother; Diabetes in his cousin; Heart disease in his father and paternal grandfather; Lung cancer in his brother; Prostate cancer in his brother and father; Stroke in his maternal grandmother.  REVIEW OF SYSTEMS  : All other systems reviewed and negative except where noted in the History of Present Illness.  PHYSICAL EXAM: BP 130/70 (BP Location: Left Arm, Patient Position: Sitting, Cuff Size: Normal)   Pulse 70   Ht 5\' 10"  (1.778 m)   Wt 220 lb 6 oz (100 kg)   BMI 31.62 kg/m  General Appearance: Well nourished, in no apparent distress. Head:   Normocephalic and atraumatic. Eyes:  sclerae anicteric,conjunctive pink  Respiratory: Respiratory effort normal, BS equal bilaterally without rales, rhonchi, wheezing. Cardio: RRR with no MRGs. Peripheral pulses intact.  Abdomen: Soft,  Non-distended ,active bowel sounds. No tenderness . No masses, lipoma soft, mobile upper AB. Rectal: Not evaluated Musculoskeletal: Full ROM, Normal gait. Without  edema. Skin:  Dry and intact without significant lesions or rashes Neuro: Alert and   oriented x4;  No focal deficits. Psych:  Cooperative. Normal mood and affect.    Doree Albee, PA-C 2:47 PM

## 2023-02-02 ENCOUNTER — Ambulatory Visit: Payer: Medicare Other | Admitting: Physician Assistant

## 2023-02-02 ENCOUNTER — Encounter: Payer: Self-pay | Admitting: Physician Assistant

## 2023-02-02 VITALS — BP 130/70 | HR 70 | Ht 70.0 in | Wt 220.4 lb

## 2023-02-02 DIAGNOSIS — I651 Occlusion and stenosis of basilar artery: Secondary | ICD-10-CM | POA: Diagnosis not present

## 2023-02-02 DIAGNOSIS — Z860101 Personal history of adenomatous and serrated colon polyps: Secondary | ICD-10-CM | POA: Diagnosis not present

## 2023-02-02 DIAGNOSIS — Z8673 Personal history of transient ischemic attack (TIA), and cerebral infarction without residual deficits: Secondary | ICD-10-CM | POA: Diagnosis not present

## 2023-02-02 DIAGNOSIS — I2510 Atherosclerotic heart disease of native coronary artery without angina pectoris: Secondary | ICD-10-CM

## 2023-02-02 DIAGNOSIS — I671 Cerebral aneurysm, nonruptured: Secondary | ICD-10-CM | POA: Diagnosis not present

## 2023-02-02 MED ORDER — NA SULFATE-K SULFATE-MG SULF 17.5-3.13-1.6 GM/177ML PO SOLN: 1.0000 | Freq: Once | ORAL | 0 refills | Status: AC

## 2023-02-02 NOTE — Patient Instructions (Signed)
You have been scheduled for a colonoscopy. Please follow written instructions given to you at your visit today.   Please pick up your prep supplies at the pharmacy within the next 1-3 days.  If you use inhalers (even only as needed), please bring them with you on the day of your procedure.  DO NOT TAKE 7 DAYS PRIOR TO TEST- Trulicity (dulaglutide) Ozempic, Wegovy (semaglutide) Mounjaro (tirzepatide) Bydureon Bcise (exanatide extended release)  DO NOT TAKE 1 DAY PRIOR TO YOUR TEST Rybelsus (semaglutide) Adlyxin (lixisenatide) Victoza (liraglutide) Byetta (exanatide) ___________________________________________________________________________   _______________________________________________________  If your blood pressure at your visit was 140/90 or greater, please contact your primary care physician to follow up on this.  _______________________________________________________  If you are age 50 or older, your body mass index should be between 23-30. Your Body mass index is 31.62 kg/m. If this is out of the aforementioned range listed, please consider follow up with your Primary Care Provider.  If you are age 32 or younger, your body mass index should be between 19-25. Your Body mass index is 31.62 kg/m. If this is out of the aformentioned range listed, please consider follow up with your Primary Care Provider.   ________________________________________________________  The [facility] providers would like to encourage you to use [facility] to communicate with providers for non-urgent requests or questions.  Due to long hold times on the telephone, sending your provider a message by [facility] may be a faster and more efficient way to get a response.  Please allow 48 business hours for a response.  Please remember that this is for non-urgent requests.  _______________________________________________________ It was a pleasure to see you today!  Thank you for trusting me with your  gastrointestinal care!

## 2023-02-09 ENCOUNTER — Telehealth (HOSPITAL_COMMUNITY): Payer: Self-pay | Admitting: Student

## 2023-02-09 NOTE — Telephone Encounter (Signed)
Recent P2Y12 of 111. Dr. Corliss Skains recommends that the patient remain on plavix. VM left for Mr. States with this information.   Alwyn Ren, [place] [phone] 02/09/2023, 12:25 PM

## 2023-02-10 DIAGNOSIS — L814 Other melanin hyperpigmentation: Secondary | ICD-10-CM | POA: Diagnosis not present

## 2023-02-10 DIAGNOSIS — Z08 Encounter for follow-up examination after completed treatment for malignant neoplasm: Secondary | ICD-10-CM | POA: Diagnosis not present

## 2023-02-10 DIAGNOSIS — L570 Actinic keratosis: Secondary | ICD-10-CM | POA: Diagnosis not present

## 2023-02-10 DIAGNOSIS — D225 Melanocytic nevi of trunk: Secondary | ICD-10-CM | POA: Diagnosis not present

## 2023-02-10 DIAGNOSIS — L821 Other seborrheic keratosis: Secondary | ICD-10-CM | POA: Diagnosis not present

## 2023-03-01 ENCOUNTER — Ambulatory Visit: Payer: Medicare Other | Admitting: Family Medicine

## 2023-03-01 ENCOUNTER — Encounter: Payer: Self-pay | Admitting: Family Medicine

## 2023-03-01 VITALS — BP 122/62 | HR 62 | Temp 98.0°F | Ht 70.0 in | Wt 222.4 lb

## 2023-03-01 DIAGNOSIS — L0390 Cellulitis, unspecified: Secondary | ICD-10-CM | POA: Diagnosis not present

## 2023-03-01 MED ORDER — DOXYCYCLINE HYCLATE 100 MG PO TABS
100.0000 mg | ORAL_TABLET | Freq: Two times a day (BID) | ORAL | 0 refills | Status: DC
Start: 2023-03-01 — End: 2023-03-01

## 2023-03-01 MED ORDER — DOXYCYCLINE HYCLATE 100 MG PO TABS: 100.0000 mg | ORAL_TABLET | Freq: Two times a day (BID) | ORAL | 0 refills | Status: DC

## 2023-03-01 NOTE — Progress Notes (Unsigned)
Left lower leg, locally red and painful, going on for about 4 days. Located closer to the ankle than the knee.  Ankle puffy.  No fevers but feels warm locally. No trauma but some bruising on the 2nd-4th toes- noted this AM.  No CP.  Not SOB.  No CP.  No h/o DVT, no FH DVT.  Shin redness isn't worse today.    Meds, vitals, and allergies reviewed.   ROS: Per HPI unless specifically indicated in ROS section   Nad Rrr Ctab L calf circ 38.5 cm R calf circ 38 cm Calf not ttp B.  5x8 cm area of redness on the anterior shin, marked with a pen.   L foot puffy with bruising on the 2nd-4th toes, normal DP pulse.

## 2023-03-01 NOTE — Patient Instructions (Signed)
Start doxy and keep your leg elevated as much as possible.   If worse- spreading redness or fever or more pain, then get rechecked.  Take care.  Glad to see you.

## 2023-03-02 DIAGNOSIS — L0390 Cellulitis, unspecified: Secondary | ICD-10-CM | POA: Insufficient documentation

## 2023-03-02 NOTE — Assessment & Plan Note (Signed)
Do not suspect DVT given anterior symptoms, exam typical for cellulitis.  No personal or family history of DVT. Start doxy and keep leg elevated as much as possible.   If worse- spreading redness or fever or more pain, then get rechecked.  He agrees to plan.  Okay for outpatient follow-up.

## 2023-03-07 ENCOUNTER — Encounter: Payer: Self-pay | Admitting: Gastroenterology

## 2023-03-09 DIAGNOSIS — Z96651 Presence of right artificial knee joint: Secondary | ICD-10-CM | POA: Diagnosis not present

## 2023-03-09 DIAGNOSIS — M25461 Effusion, right knee: Secondary | ICD-10-CM | POA: Diagnosis not present

## 2023-03-15 ENCOUNTER — Encounter: Payer: Self-pay | Admitting: Certified Registered Nurse Anesthetist

## 2023-03-17 ENCOUNTER — Encounter: Payer: Self-pay | Admitting: Gastroenterology

## 2023-03-17 ENCOUNTER — Ambulatory Visit: Payer: Medicare Other | Admitting: Gastroenterology

## 2023-03-17 VITALS — BP 126/71 | HR 61 | Temp 98.2°F | Resp 16 | Ht 70.0 in | Wt 222.0 lb

## 2023-03-17 DIAGNOSIS — Z1211 Encounter for screening for malignant neoplasm of colon: Secondary | ICD-10-CM | POA: Diagnosis not present

## 2023-03-17 DIAGNOSIS — D124 Benign neoplasm of descending colon: Secondary | ICD-10-CM

## 2023-03-17 DIAGNOSIS — Z860101 Personal history of adenomatous and serrated colon polyps: Secondary | ICD-10-CM

## 2023-03-17 DIAGNOSIS — D123 Benign neoplasm of transverse colon: Secondary | ICD-10-CM | POA: Diagnosis not present

## 2023-03-17 MED ORDER — SODIUM CHLORIDE 0.9 % IV SOLN: 500.0000 mL | Freq: Once | INTRAVENOUS | Status: DC

## 2023-03-17 NOTE — Progress Notes (Signed)
Report given to PACU, vss 

## 2023-03-17 NOTE — Progress Notes (Signed)
See 03/01/2023 H&P no changes

## 2023-03-17 NOTE — Progress Notes (Signed)
Called to room to assist during endoscopic procedure.  Patient ID and intended procedure confirmed with present staff. Received instructions for my participation in the procedure from the performing physician.  

## 2023-03-17 NOTE — Patient Instructions (Addendum)
Resume previous diet Continue present medications; Resume Plavix in 2 days at prior dose. Await pathology results  Handouts/information given for polyps, diverticulosis and hemorrhoids   YOU HAD AN ENDOSCOPIC PROCEDURE TODAY AT THE [facility]:   Refer to the procedure report that was given to you for any specific questions about what was found during the examination.  If the procedure report does not answer your questions, please call your gastroenterologist to clarify.  If you requested that your care partner not be given the details of your procedure findings, then the procedure report has been included in a sealed envelope for you to review at your convenience later.  YOU SHOULD EXPECT: Some feelings of bloating in the abdomen. Passage of more gas than usual.  Walking can help get rid of the air that was put into your GI tract during the procedure and reduce the bloating. If you had a lower endoscopy (such as a colonoscopy or flexible sigmoidoscopy) you may notice spotting of blood in your stool or on the toilet paper. If you underwent a bowel prep for your procedure, you may not have a normal bowel movement for a few days.  Please Note:  You might notice some irritation and congestion in your nose or some drainage.  This is from the oxygen used during your procedure.  There is no need for concern and it should clear up in a day or so.  SYMPTOMS TO REPORT IMMEDIATELY:  Following lower endoscopy (colonoscopy or flexible sigmoidoscopy):  Excessive amounts of blood in the stool  Significant tenderness or worsening of abdominal pains  Swelling of the abdomen that is new, acute  Fever of 100F or higher  For urgent or emergent issues, a gastroenterologist can be reached at any hour by calling [phone]. Do not use MyChart messaging for urgent concerns.    DIET:  We do recommend a small meal at first, but then you may proceed to your regular diet.  Drink plenty of fluids but  you should avoid alcoholic beverages for 24 hours.  ACTIVITY:  You should plan to take it easy for the rest of today and you should NOT DRIVE or use heavy machinery until tomorrow (because of the sedation medicines used during the test).    FOLLOW UP: Our staff will call the number listed on your records the next business day following your procedure.  We will call around 7:15- 8:00 am to check on you and address any questions or concerns that you may have regarding the information given to you following your procedure. If we do not reach you, we will leave a message.     If any biopsies were taken you will be contacted by phone or by letter within the next 1-3 weeks.  Please call us at [phone] if you have not heard about the biopsies in 3 weeks.    SIGNATURES/CONFIDENTIALITY: You and/or your care partner have signed paperwork which will be entered into your electronic medical record.  These signatures attest to the fact that that the information above on your After Visit Summary has been reviewed and is understood.  Full responsibility of the confidentiality of this discharge information lies with you and/or your care-partner.

## 2023-03-17 NOTE — Op Note (Signed)
[facility] Patient Name: Alejandro Schneider Procedure Date: 03/17/2023 2:26 PM MRN: [id number] Endoscopist: Meryl Dare , MD, [id number] Age: 72 Referring MD:  Date of Birth: Mar 31, 1951 Gender: Male Account #: [id number] Procedure:                Colonoscopy Indications:              Surveillance: Personal history of adenomatous                            polyps on last colonoscopy > 5 years ago Medicines:                Monitored Anesthesia Care Procedure:                Pre-Anesthesia Assessment:                           - Prior to the procedure, a History and Physical                            was performed, and patient medications and                            allergies were reviewed. The patient's tolerance of                            previous anesthesia was also reviewed. The risks                            and benefits of the procedure and the sedation                            options and risks were discussed with the patient.                            All questions were answered, and informed consent                            was obtained. Prior Anticoagulants: The patient has                            taken Plavix (clopidogrel), last dose was 5 days                            prior to procedure. ASA Grade Assessment: III - A                            patient with severe systemic disease. After                            reviewing the risks and benefits, the patient was                            deemed in satisfactory condition to undergo the  procedure.                           After obtaining informed consent, the colonoscope                            was passed under direct vision. Throughout the                            procedure, the patient's blood pressure, pulse, and                            oxygen saturations were monitored continuously. The                            CF [id number] #[id number] was introduced through  the anus                            and advanced to the the cecum, identified by                            appendiceal orifice and ileocecal valve. The                            ileocecal valve, appendiceal orifice, and rectum                            were photographed. The quality of the bowel                            preparation was good. The colonoscopy was performed                            without difficulty. The patient tolerated the                            procedure well. Scope In: 2:35:11 PM Scope Out: 2:51:16 PM Scope Withdrawal Time: 0 hours 11 minutes 40 seconds  Total Procedure Duration: 0 hours 16 minutes 5 seconds  Findings:                 The perianal and digital rectal examinations were                            normal.                           Three sessile polyps were found in the descending                            colon (1) and transverse colon (2). The polyps were                            5 to 7 mm in size. These polyps were removed with a  cold snare. Resection and retrieval were complete.                           The exam was otherwise without abnormality on                            direct and retroflexion views. Complications:            No immediate complications. Estimated blood loss:                            None. Estimated Blood Loss:     Estimated blood loss: none. Impression:               - Three 5 to 7 mm polyps in the descending colon                            and in the transverse colon, removed with a cold                            snare. Resected and retrieved.                           - The examination was otherwise normal on direct                            and retroflexion views. Recommendation:           - Repeat colonoscopy date to be determined after                            pending pathology results are reviewed for                            surveillance based on pathology results.                            - Resume Plavix (clopidogrel) in 2 days at prior                            dose. Refer to managing physician for further                            adjustment of therapy.                           - Patient has a contact number available for                            emergencies. The signs and symptoms of potential                            delayed complications were discussed with the                            patient. Return to  normal activities tomorrow.                            Written discharge instructions were provided to the                            patient.                           - Resume previous diet.                           - Continue present medications.                           - Await pathology results. Meryl Dare, MD 03/17/2023 2:55:39 PM This report has been signed electronically.

## 2023-03-18 ENCOUNTER — Telehealth: Payer: Self-pay

## 2023-03-18 NOTE — Telephone Encounter (Signed)
  Follow up Call-     03/17/2023    1:49 PM  Call back number  Post procedure Call Back phone  # [phone]  Permission to leave phone message Yes   Follow up call, LVM

## 2023-03-22 LAB — SURGICAL PATHOLOGY

## 2023-04-02 ENCOUNTER — Other Ambulatory Visit: Payer: Self-pay | Admitting: Family Medicine

## 2023-04-03 ENCOUNTER — Encounter: Payer: Self-pay | Admitting: Gastroenterology

## 2023-06-14 ENCOUNTER — Ambulatory Visit: Payer: Self-pay | Admitting: Family Medicine

## 2023-06-14 NOTE — Telephone Encounter (Signed)
 Copied from CRM [id number]. Topic: Clinical - Red Word Triage >> Jun 14, 2023  8:25 AM [place] A wrote: [facility] that prompted transfer to Nurse Triage: Pt has tingling in right arm, at times there is pain. Has been like this for about week; Patient has had a CVA in the past   Chief Complaint: intermittent numbness and tingling to right hand  Symptoms: feels more fatigue toward end of day  Frequency: 1-2 weeks Pertinent Negatives: Patient denies weakness numbness of face arms, legs, no vision or speech issues Disposition: [] ED /[] Urgent Care (no appt availability in office) / [x] Appointment(In office/virtual)/ []  [facility]/ [] Home Care/ [] Refused Recommended Disposition /[] [facility]/ []  Follow-up with PCP Additional Notes:   Reason for Disposition  [1] Numbness or tingling in one or both feet AND [2] is a chronic symptom (recurrent or ongoing AND present > 4 weeks)  Answer Assessment - Initial Assessment Questions 1. SYMPTOM: "What is the main symptom you are concerned about?" (e.g., weakness, numbness)    Hand tingling  slight numbness 2. ONSET: "When did this start?" (minutes, hours, days; while sleeping)     1 week 4. PATTERN "Does this come and go, or has it been constant since it started?"  "Is it present now?"     Comes and goes 5. CARDIAC SYMPTOMS: "Have you had any of the following symptoms: chest pain, difficulty breathing, palpitations?"     no 6. NEUROLOGIC SYMPTOMS: "Have you had any of the following symptoms: headache, dizziness, vision loss, double vision, changes in speech, unsteady on your feet?"     no 7. OTHER SYMPTOMS: "Do you have any other symptoms?"     Generalized weakness comes and goes  Protocols used: Neurologic Deficit-A-AH

## 2023-06-15 NOTE — Telephone Encounter (Signed)
 Closing encounter nothing sooner across [facility] offices

## 2023-06-15 NOTE — Telephone Encounter (Signed)
 Noted. Thanks.  If there is an open slot sooner, see if patient can get moved up.

## 2023-06-16 ENCOUNTER — Ambulatory Visit (INDEPENDENT_AMBULATORY_CARE_PROVIDER_SITE_OTHER): Admitting: Family Medicine

## 2023-06-16 ENCOUNTER — Ambulatory Visit: Admitting: Family Medicine

## 2023-06-16 VITALS — BP 130/68 | HR 63 | Temp 98.2°F | Ht 70.0 in | Wt 219.5 lb

## 2023-06-16 DIAGNOSIS — G5601 Carpal tunnel syndrome, right upper limb: Secondary | ICD-10-CM | POA: Diagnosis not present

## 2023-06-16 NOTE — Assessment & Plan Note (Signed)
 Acute, no clear cervical radiculopathy source of hand numbness and pain.  Likely compression at right carpal tunnel.  Discussed with him relieving pressure and carpal tunnel by avoiding wrist position when using tools or riding a motorcycle.  He will start wearing carpal tunnel braces at night over the next 6 to 8 weeks.  If his symptoms are not improving as expected he will follow-up with his PCP for further evaluation.

## 2023-06-16 NOTE — Patient Instructions (Signed)
 Wear carpal tunnel brace at night x 4-6 week.. call if not improving.   Decrease pressure on wrist during day activities like motorcycle riding.

## 2023-06-16 NOTE — Progress Notes (Signed)
 Patient ID: Alejandro Schneider, male    DOB: 1950/09/01, 73 y.o.   MRN: [id number]  This visit was conducted in person.  BP 130/68 (BP Location: Left Arm, Patient Position: Sitting, Cuff Size: Large)   Pulse 63   Temp 98.2 F (36.8 C) (Temporal)   Ht 5\' 10"  (1.778 m)   Wt 219 lb 8 oz (99.6 kg)   SpO2 96%   BMI 31.49 kg/m    CC:  Chief Complaint  Patient presents with   Hand Numbness    Right-Intermittent x 1 week     Subjective:   HPI: Alejandro Schneider is a 73 y.o. male  patient of Dr. Lianne Bushy with history of CVA, HTN, CAD presenting on 06/16/2023 for Hand Numbness (Right-Intermittent x 1 week/)  He has noted  in last 2 weeks. Noted waking up at night with right fingers t from numb  and tingly.  Thumb and first 2 fingers.  No new weakness.  Moves it and it improves and falls back asleep.  Notes it less during the day.     Riding motorcycle and wors with tool.   No neck pain . No recent falls.  NO elbow and wrist pain   Feeling more weak and tired later in the day x 2 months.     Relevant past medical, surgical, family and social history reviewed and updated as indicated. Interim medical history since our last visit reviewed. Allergies and medications reviewed and updated. Outpatient Medications Prior to Visit  Medication Sig Dispense Refill   amLODipine (NORVASC) 10 MG tablet Take 1 tablet (10 mg total) by mouth daily. 90 tablet 3   aspirin 81 MG chewable tablet Chew 81 mg by mouth daily.     clopidogrel (PLAVIX) 75 MG tablet TAKE ONE TABLET BY MOUTH ONE TIME DAILY 90 tablet 3   clotrimazole-betamethasone (LOTRISONE) cream Apply 1 application topically 2 (two) times daily as needed. 30 g 1   Multiple Vitamins-Minerals (MULTIVITAMIN,TX-MINERALS) tablet Take 1 tablet by mouth daily.     Omega-3 Fatty Acids (FISH OIL) 1000 MG CAPS Take 1,000 mg by mouth daily.     rosuvastatin (CRESTOR) 20 MG tablet Take 1 tablet (20 mg total) by mouth daily. 90 tablet 3   sildenafil  (REVATIO) 20 MG tablet Take 1-5 tablets (20-100 mg total) by mouth daily as needed. 50 tablet 12   No facility-administered medications prior to visit.     Per HPI unless specifically indicated in ROS section below Review of Systems  Constitutional:  Negative for fatigue and fever.  HENT:  Negative for ear pain.   Eyes:  Negative for pain.  Respiratory:  Negative for cough and shortness of breath.   Cardiovascular:  Negative for chest pain, palpitations and leg swelling.  Gastrointestinal:  Negative for abdominal pain.  Genitourinary:  Negative for dysuria.  Musculoskeletal:  Negative for arthralgias.  Neurological:  Negative for syncope, light-headedness and headaches.  Psychiatric/Behavioral:  Negative for dysphoric mood.    Objective:  BP 130/68 (BP Location: Left Arm, Patient Position: Sitting, Cuff Size: Large)   Pulse 63   Temp 98.2 F (36.8 C) (Temporal)   Ht 5\' 10"  (1.778 m)   Wt 219 lb 8 oz (99.6 kg)   SpO2 96%   BMI 31.49 kg/m   Wt Readings from Last 3 Encounters:  06/16/23 219 lb 8 oz (99.6 kg)  03/17/23 222 lb (100.7 kg)  03/01/23 222 lb 6.4 oz (100.9 kg)  Physical Exam Vitals reviewed.  Constitutional:      Appearance: He is well-developed.  HENT:     Head: Normocephalic.     Right Ear: Hearing normal.     Left Ear: Hearing normal.     Nose: Nose normal.  Neck:     Thyroid: No thyroid mass or thyromegaly.     Vascular: No carotid bruit.     Trachea: Trachea normal.  Cardiovascular:     Rate and Rhythm: Normal rate and regular rhythm.     Pulses: Normal pulses.     Heart sounds: Heart sounds not distant. No murmur heard.    No friction rub. No gallop.     Comments: No peripheral edema Pulmonary:     Effort: Pulmonary effort is normal. No respiratory distress.     Breath sounds: Normal breath sounds.  Skin:    General: Skin is warm and dry.     Findings: No rash.  Neurological:     General: No focal deficit present.     Mental Status: He is  alert and oriented to person, place, and time.     Cranial Nerves: Cranial nerves 2-12 are intact.     Sensory: Sensation is intact.     Motor: Motor function is intact.     Coordination: Coordination is intact.     Deep Tendon Reflexes: Reflexes are normal and symmetric.     Comments: no atrophy and thenar eminence  Negative Tinel and Phalen test. Negative bilateral Spurling test.  Psychiatric:        Speech: Speech normal.        Behavior: Behavior normal.        Thought Content: Thought content normal.       Results for orders placed or performed in visit on 03/17/23  Surgical pathology ([facility])   Collection Time: 03/17/23 12:00 AM  Result Value Ref Range   SURGICAL PATHOLOGY      SURGICAL PATHOLOGY [facility] [address], [address] [place], [place] [address] Telephone [phone] or [phone] Fax [phone]  REPORT OF SURGICAL PATHOLOGY   Accession #: [id number] Patient Name: Alejandro Schneider, Alejandro Schneider Visit # : [id number]  MRN: [id number] Physician: Claudette Head DOB/Age 04/08/1950 (Age: 83) Gender: M Collected Date: 03/17/2023 Received Date: 03/21/2023  FINAL DIAGNOSIS       1. Surgical [P], colon, descending and transverse, polyp (3) :       TUBULAR ADENOMA, 3 FRAGMENTS      NEGATIVE FOR HIGH-GRADE DYSPLASIA AND CARCINOMA       DATE SIGNED OUT: 03/22/2023 ELECTRONIC SIGNATURE : Picklesimer Md, Fred , Sports administrator, Electronic Signature  MICROSCOPIC DESCRIPTION  CASE COMMENTS STAINS USED IN DIAGNOSIS: H&E H&E-2    CLINICAL HISTORY  SPECIMEN(S) OBTAINED 1. Surgical [P], Colon, Descending And Transverse, Polyp (3)  SPECIMEN COMMENTS: 1. Hx of adenomatous colonic polyps; benign neoplasm of transv erse colon; benign neoplasm of descending colon SPECIMEN CLINICAL INFORMATION: 1. R/O adenoma    Gross Description 1. Received in formalin are tan, soft tissue fragments that are submitted in toto. Number: 3, Size: 0.4  cm smallest to 1.2 cm largest, (1B) ( TA )        Report signed out from the following location(s) [place] [address], [place] [address] CLIA #: [id number]  [facility] [address] [place], [place] [address] CLIA #: [id number]     Assessment and Plan  Right carpal tunnel syndrome Assessment &  Plan: Acute, no clear cervical radiculopathy source of hand numbness and pain.  Likely compression at right carpal tunnel.  Discussed with him relieving pressure and carpal tunnel by avoiding wrist position when using tools or riding a motorcycle.  He will start wearing carpal tunnel braces at night over the next 6 to 8 weeks.  If his symptoms are not improving as expected he will follow-up with his PCP for further evaluation.     No follow-ups on file.   [name], MD

## 2023-07-11 DIAGNOSIS — K080 Exfoliation of teeth due to systemic causes: Secondary | ICD-10-CM | POA: Diagnosis not present

## 2023-10-01 ENCOUNTER — Encounter (HOSPITAL_COMMUNITY): Payer: Self-pay | Admitting: Interventional Radiology

## 2023-10-19 DIAGNOSIS — M25561 Pain in right knee: Secondary | ICD-10-CM | POA: Diagnosis not present

## 2023-10-19 DIAGNOSIS — Z96651 Presence of right artificial knee joint: Secondary | ICD-10-CM | POA: Diagnosis not present

## 2023-10-26 DIAGNOSIS — K080 Exfoliation of teeth due to systemic causes: Secondary | ICD-10-CM | POA: Diagnosis not present

## 2023-10-31 ENCOUNTER — Other Ambulatory Visit (INDEPENDENT_AMBULATORY_CARE_PROVIDER_SITE_OTHER)

## 2023-10-31 ENCOUNTER — Other Ambulatory Visit: Payer: Self-pay | Admitting: Family Medicine

## 2023-10-31 DIAGNOSIS — I10 Essential (primary) hypertension: Secondary | ICD-10-CM

## 2023-10-31 DIAGNOSIS — E785 Hyperlipidemia, unspecified: Secondary | ICD-10-CM

## 2023-10-31 DIAGNOSIS — Z125 Encounter for screening for malignant neoplasm of prostate: Secondary | ICD-10-CM

## 2023-10-31 DIAGNOSIS — R739 Hyperglycemia, unspecified: Secondary | ICD-10-CM

## 2023-10-31 LAB — LIPID PANEL
Cholesterol: 121 mg/dL (ref 0–200)
HDL: 43.6 mg/dL (ref >39.00)
LDL Cholesterol: 59 mg/dL (ref 0–99)
NonHDL: 77.58
Total CHOL/HDL Ratio: 3
Triglycerides: 95 mg/dL (ref 0.0–149.0)
VLDL: 19 mg/dL (ref 0.0–40.0)

## 2023-10-31 LAB — CBC WITH DIFFERENTIAL/PLATELET
Basophils Absolute: 0 K/uL (ref 0.0–0.1)
Basophils Relative: 0.5 % (ref 0.0–3.0)
Eosinophils Absolute: 0.3 K/uL (ref 0.0–0.7)
Eosinophils Relative: 4.5 % (ref 0.0–5.0)
HCT: 42.3 % (ref 39.0–52.0)
Hemoglobin: 14.6 g/dL (ref 13.0–17.0)
Lymphocytes Relative: 18.8 % (ref 12.0–46.0)
Lymphs Abs: 1.1 K/uL (ref 0.7–4.0)
MCHC: 34.4 g/dL (ref 30.0–36.0)
MCV: 90.4 fl (ref 78.0–100.0)
Monocytes Absolute: 0.6 K/uL (ref 0.1–1.0)
Monocytes Relative: 9.4 % (ref 3.0–12.0)
Neutro Abs: 3.9 K/uL (ref 1.4–7.7)
Neutrophils Relative %: 66.8 % (ref 43.0–77.0)
Platelets: 173 K/uL (ref 150.0–400.0)
RBC: 4.68 Mil/uL (ref 4.22–5.81)
RDW: 13.8 % (ref 11.5–15.5)
WBC: 5.9 K/uL (ref 4.0–10.5)

## 2023-10-31 LAB — COMPREHENSIVE METABOLIC PANEL WITH GFR
ALT: 23 U/L (ref 0–53)
AST: 17 U/L (ref 0–37)
Albumin: 4.5 g/dL (ref 3.5–5.2)
Alkaline Phosphatase: 68 U/L (ref 39–117)
BUN: 14 mg/dL (ref 6–23)
CO2: 26 meq/L (ref 19–32)
Calcium: 8.7 mg/dL (ref 8.4–10.5)
Chloride: 106 meq/L (ref 96–112)
Creatinine, Ser: 0.75 mg/dL (ref 0.40–1.50)
GFR: 89.96 mL/min (ref >60.00)
Glucose, Bld: 118 mg/dL — ABNORMAL HIGH (ref 70–99)
Potassium: 4.4 meq/L (ref 3.5–5.1)
Sodium: 139 meq/L (ref 135–145)
Total Bilirubin: 0.7 mg/dL (ref 0.2–1.2)
Total Protein: 6.4 g/dL (ref 6.0–8.3)

## 2023-10-31 LAB — HEMOGLOBIN A1C: Hgb A1c MFr Bld: 6.1 % (ref 4.6–6.5)

## 2023-11-01 LAB — PSA, MEDICARE: PSA: 2.4 ng/mL (ref 0.10–4.00)

## 2023-11-01 LAB — TSH: TSH: 1.99 u[IU]/mL (ref 0.35–5.50)

## 2023-11-02 ENCOUNTER — Ambulatory Visit: Payer: Self-pay | Admitting: Family Medicine

## 2023-11-04 ENCOUNTER — Ambulatory Visit (INDEPENDENT_AMBULATORY_CARE_PROVIDER_SITE_OTHER): Admitting: Family Medicine

## 2023-11-04 ENCOUNTER — Encounter: Payer: Self-pay | Admitting: Family Medicine

## 2023-11-04 VITALS — BP 136/68 | HR 55 | Temp 98.5°F | Ht 68.75 in | Wt 216.2 lb

## 2023-11-04 DIAGNOSIS — Z8673 Personal history of transient ischemic attack (TIA), and cerebral infarction without residual deficits: Secondary | ICD-10-CM

## 2023-11-04 DIAGNOSIS — I10 Essential (primary) hypertension: Secondary | ICD-10-CM | POA: Diagnosis not present

## 2023-11-04 DIAGNOSIS — Z0000 Encounter for general adult medical examination without abnormal findings: Secondary | ICD-10-CM

## 2023-11-04 DIAGNOSIS — N529 Male erectile dysfunction, unspecified: Secondary | ICD-10-CM

## 2023-11-04 DIAGNOSIS — E785 Hyperlipidemia, unspecified: Secondary | ICD-10-CM | POA: Diagnosis not present

## 2023-11-04 DIAGNOSIS — Z7189 Other specified counseling: Secondary | ICD-10-CM

## 2023-11-04 MED ORDER — CLOPIDOGREL BISULFATE 75 MG PO TABS: ORAL_TABLET | ORAL | 3 refills | Status: AC

## 2023-11-04 MED ORDER — SILDENAFIL CITRATE 20 MG PO TABS: 20.0000 mg | ORAL_TABLET | Freq: Every day | ORAL | 12 refills | Status: AC | PRN

## 2023-11-04 MED ORDER — ROSUVASTATIN CALCIUM 20 MG PO TABS: 30.0000 mg | ORAL_TABLET | Freq: Every day | ORAL | 3 refills | Status: DC

## 2023-11-04 MED ORDER — GLUCOSAMINE-CHONDROITIN 500-400 MG PO TABS: 1.0000 | ORAL_TABLET | Freq: Two times a day (BID) | ORAL | Status: AC

## 2023-11-04 MED ORDER — AMLODIPINE BESYLATE 10 MG PO TABS: 10.0000 mg | ORAL_TABLET | Freq: Every day | ORAL | 3 refills | Status: DC

## 2023-11-04 NOTE — Progress Notes (Signed)
 Tetanus 2024 Shingrix prev done.  Flu prev done PNA 2019 covid 2021 Colonosocpy 2024 PSA wnl.  D/w pt.  FH noted.   Living will d/w pt- wife designated if patient were incapacitated.   Diet and exercise d/w pt.  Exercise as tolerated, diet is good.   Hypertension:               Using medication without problems or lightheadedness: yes Chest pain with exertion:no Edema:no Short of breath:no   H/o ED. No NTG use.  Needed refill on sildenail.  It helped.  No ADE on med.   He has routine derm f/u.    He is deferring f/u tx on his R knee.  He still has some soreness and is putting up with that.  D/w pt about trial of glucosamine and chondroitin.     Elevated Cholesterol: Using medications without problems: yes Muscle aches: no Diet compliance: yes Exercise: yes Labs d/w pt.  Goal LDL 55.  Currently LDL 59.    No bleeding, d/w pt about continue aspirin  and plavix .  He needs a new IR doctor with Dr. Dolphus leaving.  Discussed.  Referral placed.  Meds, vitals, and allergies reviewed.   ROS: Per HPI unless specifically indicated in ROS section   GEN: nad, alert and oriented HEENT: ncat NECK: supple w/o LA CV: rrr PULM: ctab, no inc wob ABD: soft, +bs EXT: no edema SKIN: Well-perfused

## 2023-11-04 NOTE — Patient Instructions (Addendum)
 Try taking 1.5 tabs of crestor  per day and let me know if you can't tolerate that.   Recheck fasting labs in about 2-3 months.  Update me as needed.  Take care.  Glad to see you. I would get a flu shot each fall.

## 2023-11-06 NOTE — Assessment & Plan Note (Signed)
 Discussed trying to increase Crestor  to 30 mg and then rechecking labs if tolerated.  Labs discussed with patient.

## 2023-11-06 NOTE — Assessment & Plan Note (Signed)
 Tetanus 2024 Shingrix prev done.  Flu prev done PNA 2019 covid 2021 Colonosocpy 2024 PSA wnl.  D/w pt.  FH noted.   Living will d/w pt- wife designated if patient were incapacitated.   Diet and exercise d/w pt.  Exercise as tolerated, diet is good.

## 2023-11-06 NOTE — Assessment & Plan Note (Signed)
Continue amlodipine.  Continue work on diet and exercise. 

## 2023-11-06 NOTE — Assessment & Plan Note (Signed)
 No new symptoms.  Continue amlodipine .  Increase Crestor  as above. No bleeding, d/w pt about continue aspirin  and plavix .  He needs a new IR doctor with Dr. Dolphus leaving.  Discussed.  Referral placed.

## 2023-11-06 NOTE — Assessment & Plan Note (Signed)
Living will d/w pt- wife designated if patient were incapacitated.   

## 2023-11-06 NOTE — Assessment & Plan Note (Signed)
 Continue sildenafil as needed.

## 2023-11-09 ENCOUNTER — Other Ambulatory Visit: Payer: Self-pay | Admitting: Internal Medicine

## 2023-11-11 NOTE — Telephone Encounter (Signed)
 I resent them to [facility].  Thanks.

## 2023-11-15 ENCOUNTER — Ambulatory Visit: Payer: Medicare Other

## 2023-11-15 VITALS — Ht 68.75 in | Wt 216.0 lb

## 2023-11-15 DIAGNOSIS — Z0000 Encounter for general adult medical examination without abnormal findings: Secondary | ICD-10-CM | POA: Diagnosis not present

## 2023-11-15 NOTE — Patient Instructions (Signed)
 [name] , Thank you for taking time out of your busy schedule to complete your Annual Wellness Visit with me. I enjoyed our conversation and look forward to speaking with you again next year. I, as well as your care team,  appreciate your ongoing commitment to your health goals. Please review the following plan we discussed and let me know if I can assist you in the future. Your Game plan/ To Do List    Referrals: If you haven't heard from the office you've been referred to, please reach out to them at the phone provided.   Follow up Visits: We will see or speak with you next year for your Next Medicare AWV with our clinical staff-11/15/24 @ 10:10am televisit Have you seen your provider in the last 6 months (3 months if uncontrolled diabetes)? Yes  Clinician Recommendations:  Aim for 30 minutes of exercise or brisk walking, 6-8 glasses of water, and 5 servings of fruits and vegetables each day.       This is a list of the screenings recommended for you:  Health Maintenance  Topic Date Due   Flu Shot  11/04/2023   COVID-19 Vaccine (5 - 2024-25 season) 01/16/2028*   Medicare Annual Wellness Visit  11/14/2024   Colon Cancer Screening  03/16/2026   DTaP/Tdap/Td vaccine (4 - Td or Tdap) 12/29/2032   Pneumococcal Vaccine for age over 70  Completed   Hepatitis C Screening  Completed   Zoster (Shingles) Vaccine  Completed   Hepatitis B Vaccine  Aged Out   HPV Vaccine  Aged Out   Meningitis B Vaccine  Aged Out  *Topic was postponed. The date shown is not the original due date.    Advanced directives: (Copy Requested) Please bring a copy of your health care power of attorney and living will to the office to be added to your chart at your convenience. You can mail to [facility] [address] [place], [place] [address] or email to [email or web address] Advance Care Planning is important because it:  [x]  Makes sure you receive the medical care that is consistent with  your values, goals, and preferences  [x]  It provides guidance to your family and loved ones and reduces their decisional burden about whether or not they are making the right decisions based on your wishes.  Follow the link provided in your after visit summary or read over the paperwork we have mailed to you to help you started getting your Advance Directives in place. If you need assistance in completing these, please reach out to us  so that we can help you!

## 2023-11-15 NOTE — Progress Notes (Signed)
 Subjective:   Alejandro Schneider is a 73 y.o. who presents for a Medicare Wellness preventive visit.  As a reminder, Annual Wellness Visits don't include a physical exam, and some assessments may be limited, especially if this visit is performed virtually. We may recommend an in-person follow-up visit with your provider if needed.  Visit Complete: Virtual I connected with  [name] on 11/15/23 by a audio enabled telemedicine application and verified that I am speaking with the correct person using two identifiers.  Patient Location: Home  Provider Location: Office/Clinic  I discussed the limitations of evaluation and management by telemedicine. The patient expressed understanding and agreed to proceed.  Vital Signs: Because this visit was a virtual/telehealth visit, some criteria may be missing or patient reported. Any vitals not documented were not able to be obtained and vitals that have been documented are patient reported.  VideoDeclined- This patient declined Librarian, academic. Therefore the visit was completed with audio only.  Persons Participating in Visit: Patient.  AWV Questionnaire: Yes: Patient Medicare AWV questionnaire was completed by the patient on 11/14/23; I have confirmed that all information answered by patient is correct and no changes since this date.  Cardiac Risk Factors include: advanced age (>71men, >14 women);dyslipidemia;hypertension;male gender;obesity (BMI >30kg/m2)     Objective:    Today's Vitals   11/15/23 0854  Weight: 216 lb (98 kg)  Height: 5' 8.75 (1.746 m)   Body mass index is 32.13 kg/m.     11/15/2023    9:01 AM 11/09/2022    1:04 PM 10/13/2021    3:07 PM 10/09/2020    9:47 AM 09/27/2019    3:35 PM 09/19/2018    1:08 PM 09/19/2015    7:21 AM  Advanced Directives  Does Patient Have a Medical Advance Directive? Yes Yes No Yes No No No   Type of Estate agent of [place];Living will  Healthcare Power of [place];Living will  Healthcare Power of [place];Living will     Copy of Healthcare Power of Attorney in Chart? No - copy requested No - copy requested  No - copy requested     Would patient like information on creating a medical advance directive?   No - Patient declined  Yes (MAU/Ambulatory/Procedural Areas - Information given) No - Patient declined  No - patient declined information      Data saved with a previous flowsheet row definition    Current Medications (verified) Outpatient Encounter Medications as of 11/15/2023  Medication Sig   amLODipine  (NORVASC ) 10 MG tablet TAKE ONE TABLET BY MOUTH ONE TIME DAILY   aspirin  81 MG chewable tablet Chew 81 mg by mouth daily.   clopidogrel  (PLAVIX ) 75 MG tablet TAKE ONE TABLET BY MOUTH ONE TIME DAILY   clotrimazole -betamethasone  (LOTRISONE ) cream Apply 1 application topically 2 (two) times daily as needed.   glucosamine-chondroitin 500-400 MG tablet Take 1 tablet by mouth in the morning and at bedtime.   Multiple Vitamins-Minerals (MULTIVITAMIN,TX-MINERALS) tablet Take 1 tablet by mouth daily.   Omega-3 Fatty Acids (FISH OIL) 1000 MG CAPS Take 1,000 mg by mouth daily.   rosuvastatin  (CRESTOR ) 20 MG tablet Take 1.5 tablets (30 mg total) by mouth daily.   sildenafil  (REVATIO ) 20 MG tablet Take 1-5 tablets (20-100 mg total) by mouth daily as needed.   No facility-administered encounter medications on file as of 11/15/2023.    Allergies (verified) Bee venom, Gabapentin , Lipitor [atorvastatin ], and Simvastatin    History: Past Medical History:  Diagnosis  Date   Allergy    CVA (cerebral infarction)    Hyperlipidemia    Hypertension    Internal hemorrhoids    Stroke [facility])    Past Surgical History:  Procedure Laterality Date   COLONOSCOPY  2006   INGUINAL HERNIA REPAIR  10/16/2002   Right  Dr Gladis ILES LAMINECTOMY/DECOMPRESSION MICRODISCECTOMY Right 05/30/2012   Procedure: LUMBAR LAMINECTOMY/DECOMPRESSION  MICRODISCECTOMY 1 LEVEL;  Surgeon: Darina MALVA Boehringer, MD;  Location: [facility];  Service: Neurosurgery;  Laterality: Right;   RADIOLOGY WITH ANESTHESIA N/A 05/21/2014   Procedure: ANGIOPLASTY POSSIBLE STENT;  Surgeon: Thyra MARLA Nash, MD;  Location: [facility] OR;  Service: Radiology;  Laterality: N/A;   TONSILLECTOMY  1972   TOTAL KNEE ARTHROPLASTY  03/08/2022   Family History  Problem Relation Age of Onset   Depression Mother    Heart disease Father        CABGx4   Cancer Father        Prostate (Radiation)   Prostate cancer Father    Prostate cancer Brother    Lung cancer Brother    Stroke Maternal Grandmother    Heart disease Paternal Grandfather        MI   Diabetes Cousin    Hypertension Neg Hx    Drug abuse Neg Hx    Alcohol abuse Neg Hx    Colon cancer Neg Hx    Esophageal cancer Neg Hx    Pancreatic cancer Neg Hx    Rectal cancer Neg Hx    Stomach cancer Neg Hx    Colon polyps Neg Hx    Social History   Socioeconomic History   Marital status: Married    Spouse name: Not on file   Number of children: 1   Years of education: Not on file   Highest education level: 12th grade  Occupational History   Occupation: Research scientist (life sciences)  Tobacco Use   Smoking status: Never   Smokeless tobacco: Never  Substance and Sexual Activity   Alcohol use: Not Currently    Comment: occasional   Drug use: No   Sexual activity: Yes  Other Topics Concern   Not on file  Social History Narrative   Married 1974   1 son, Juliene   Retired as Scientist, 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   Retired Copywriter, advertising at [employer]   Social Drivers of [employer] Strain: Low Risk  (11/15/2023)   Overall Financial Resource Strain (CARDIA)    Difficulty of Paying Living Expenses: Not hard at all  Food Insecurity: No Food Insecurity (11/15/2023)   Hunger Vital Sign    Worried About Running Out of Food in the Last Year: Never true    Ran Out of Food in the Last Year: Never true   Transportation Needs: No Transportation Needs (11/15/2023)   PRAPARE - Administrator, Civil Service (Medical): No    Lack of Transportation (Non-Medical): No  Physical Activity: Sufficiently Active (11/15/2023)   Exercise Vital Sign    Days of Exercise per Week: 5 days    Minutes of Exercise per Session: 30 min  Stress: No Stress Concern Present (11/15/2023)   [employer] of Occupational Health - Occupational Stress Questionnaire    Feeling of Stress: Not at all  Social Connections: Socially Integrated (11/15/2023)   Social Connection and Isolation Panel    Frequency of Communication with Friends and Family: More than three times a week    Frequency  of Social Gatherings with Friends and Family: More than three times a week    Attends Religious Services: More than 4 times per year    Active Member of [employer] or Organizations: Yes    Attends Engineer, structural: More than 4 times per year    Marital Status: Married    Tobacco Counseling Counseling given: Not Answered    Clinical Intake:  Pre-visit preparation completed: Yes  Pain : No/denies pain     BMI - recorded: 32.13 Nutritional Status: BMI > 30  Obese Nutritional Risks: None Diabetes: No  Lab Results  Component Value Date   HGBA1C 6.1 10/31/2023   HGBA1C 6.0 10/20/2022   HGBA1C 5.9 10/13/2021     How often do you need to have someone help you when you read instructions, pamphlets, or other written materials from your doctor or pharmacy?: 1 - Never  Interpreter Needed?: No  Comments: lives with wife Information entered by :: B.Adya Wirz,LPN   Activities of Daily Living     11/14/2023    6:04 PM  In your present state of health, do you have any difficulty performing the following activities:  Hearing? 0  Vision? 0  Difficulty concentrating or making decisions? 0  Walking or climbing stairs? 0  Dressing or bathing? 0  Doing errands, shopping? 0  Preparing Food and eating ? N   Using the Toilet? N  In the past six months, have you accidently leaked urine? N  Do you have problems with loss of bowel control? N  Managing your Medications? N  Managing your Finances? N  Housekeeping or managing your Housekeeping? N    Patient Care Team: Cleatus Arlyss RAMAN, MD as PCP - General (Family Medicine) Thukkani, Arun K, MD as PCP - Cardiology (Cardiology) Carolee Sherwood JONETTA DOUGLAS, MD as Consulting Physician (Urology) [facility])  I have updated your Care Teams any recent Medical Services you may have received from other providers in the past year.     Assessment:   This is a routine wellness examination for Cris.  Hearing/Vision screen Hearing Screening - Comments:: Pt says his hearing is good Vision Screening - Comments:: Pt says his vision is good;readers only Dr Raelyn   Goals Addressed             This Visit's Progress    COMPLETED: DIET - EAT MORE FRUITS AND VEGETABLES       Patient Stated   On track    11/15/23-I will continue to take medications as prescribed.      Patient Stated   On track    11/15/23- I will continue to walk everyday for 30 minutes.     COMPLETED: Patient Stated       10/09/2020, I will continue to walk everyday for 30 minutes.     Patient Stated   On track    11/15/23-Stay healthy       Depression Screen     11/15/2023    8:58 AM 11/04/2023   10:29 AM 03/01/2023    8:04 AM 11/09/2022    1:03 PM 10/26/2022   12:27 PM 10/13/2021    3:05 PM 10/09/2020    9:48 AM  PHQ 2/9 Scores  PHQ - 2 Score 0 0 0 0 0 0 0  PHQ- 9 Score   0 0 0 0 0    Fall Risk     11/14/2023    6:04 PM 11/04/2023   10:28 AM 03/01/2023    8:04 AM  11/08/2022    5:23 PM 10/26/2022   12:27 PM  Fall Risk   Falls in the past year? 0 0 0 0 0  Number falls in past yr:   0 0 0  Injury with Fall?   0 0 0  Risk for fall due to : No Fall Risks  No Fall Risks No Fall Risks No Fall Risks  Follow up Education provided;Falls prevention discussed  Falls evaluation  completed Falls evaluation completed;Falls prevention discussed Falls evaluation completed    MEDICARE RISK AT HOME:  Medicare Risk at Home Any stairs in or around the home?: (Patient-Rptd) Yes If so, are there any without handrails?: (Patient-Rptd) No Home free of loose throw rugs in walkways, pet beds, electrical cords, etc?: (Patient-Rptd) Yes Adequate lighting in your home to reduce risk of falls?: (Patient-Rptd) Yes Life alert?: (Patient-Rptd) No Use of a cane, walker or w/c?: (Patient-Rptd) No Grab bars in the bathroom?: (Patient-Rptd) No Shower chair or bench in shower?: (Patient-Rptd) No Elevated toilet seat or a handicapped toilet?: (Patient-Rptd) No  TIMED UP AND GO:  Was the test performed?  No  Cognitive Function: 6CIT completed    10/09/2020    9:48 AM 09/27/2019    3:38 PM 09/19/2018    1:08 PM  MMSE - Mini Mental State Exam  Not completed: Refused Refused   Orientation to time   5  Orientation to Place   5  Registration   3  Attention/ Calculation   0  Recall   3  Language- name 2 objects   0  Language- repeat   1  Language- follow 3 step command   0  Language- read & follow direction   0  Write a sentence   0  Copy design   0  Total score   17        11/15/2023    9:02 AM 11/09/2022    1:04 PM 10/13/2021    3:09 PM  6CIT Screen  What Year? 0 points 0 points 0 points  What month? 0 points 0 points 0 points  What time? 0 points 0 points 0 points  Count back from 20 0 points 0 points 0 points  Months in reverse 0 points 0 points 0 points  Repeat phrase 0 points 0 points 0 points  Total Score 0 points 0 points 0 points    Immunizations Immunization History  Administered Date(s) Administered   Fluad Trivalent(High Dose 65+) 12/30/2022   Influenza, High Dose Seasonal PF 02/01/2017, 01/14/2020   Influenza,inj,Quad PF,6+ Mos 02/19/2022   Influenza-Unspecified 02/01/2014, 01/19/2019, 01/28/2021   Moderna Sars-Covid-2 Vaccination 04/10/2019, 05/07/2019,  02/05/2020, 01/13/2021   Pneumococcal Conjugate-13 10/01/2016   Pneumococcal Polysaccharide-23 10/19/2017   Td 04/05/2002   Tdap 09/05/2012, 12/30/2022   Zoster Recombinant(Shingrix) 01/14/2020, 07/11/2020   Zoster, Live 09/06/2013    Screening Tests Health Maintenance  Topic Date Due   INFLUENZA VACCINE  11/04/2023   COVID-19 Vaccine (5 - 2024-25 season) 01/16/2028 (Originally 12/05/2022)   Medicare Annual Wellness (AWV)  11/14/2024   Colonoscopy  03/16/2026   DTaP/Tdap/Td (4 - Td or Tdap) 12/29/2032   Pneumococcal Vaccine: 50+ Years  Completed   Hepatitis C Screening  Completed   Zoster Vaccines- Shingrix  Completed   Hepatitis B Vaccines  Aged Out   HPV VACCINES  Aged Out   Meningococcal B Vaccine  Aged Out    Health Maintenance  Health Maintenance Due  Topic Date Due   INFLUENZA VACCINE  11/04/2023  Health Maintenance Items Addressed: None needed at this time  Additional Screening:  Vision Screening: Recommended annual ophthalmology exams for early detection of glaucoma and other disorders of the eye. Would you like a referral to an eye doctor? No    Dental Screening: Recommended annual dental exams for proper oral hygiene  Community Resource Referral / Chronic Care Management: CRR required this visit?  No   CCM required this visit?  No   Plan:    I have personally reviewed and noted the following in the patient's chart:   Medical and social history Use of alcohol, tobacco or illicit drugs  Current medications and supplements including opioid prescriptions. Patient is not currently taking opioid prescriptions. Functional ability and status Nutritional status Physical activity Advanced directives List of other physicians Hospitalizations, surgeries, and ER visits in previous 12 months Vitals Screenings to include cognitive, depression, and falls Referrals and appointments  In addition, I have reviewed and discussed with patient certain preventive  protocols, quality metrics, and best practice recommendations. A written personalized care plan for preventive services as well as general preventive health recommendations were provided to patient.   Erminio LITTIE Saris, LPN   [id number]   After Visit Summary: (MyChart) Due to this being a telephonic visit, the after visit summary with patients personalized plan was offered to patient via MyChart   Notes: Nothing significant to report at this time.

## 2023-12-21 ENCOUNTER — Other Ambulatory Visit (HOSPITAL_COMMUNITY): Payer: Self-pay

## 2023-12-21 ENCOUNTER — Telehealth (HOSPITAL_COMMUNITY): Payer: Self-pay

## 2023-12-21 DIAGNOSIS — I729 Aneurysm of unspecified site: Secondary | ICD-10-CM

## 2023-12-21 DIAGNOSIS — I771 Stricture of artery: Secondary | ICD-10-CM

## 2023-12-21 DIAGNOSIS — E785 Hyperlipidemia, unspecified: Secondary | ICD-10-CM

## 2023-12-21 NOTE — Telephone Encounter (Signed)
 Pt called regarding a referral since Dr. Dolphus is no longer here. Will send a referral over to Dr. Lester. AB

## 2023-12-28 ENCOUNTER — Ambulatory Visit (INDEPENDENT_AMBULATORY_CARE_PROVIDER_SITE_OTHER): Admitting: Neuroradiology

## 2023-12-28 ENCOUNTER — Encounter: Payer: Self-pay | Admitting: Neuroradiology

## 2023-12-28 VITALS — BP 163/91 | HR 58 | Ht 70.0 in | Wt 219.0 lb

## 2023-12-28 DIAGNOSIS — I672 Cerebral atherosclerosis: Secondary | ICD-10-CM

## 2023-12-28 NOTE — Progress Notes (Signed)
 I had the pleasure of meeting with Alejandro Schneider and his wife in the office today.  He had a posterior circulation stroke in 2016.  At that time he was found to have a left vertebral artery occlusion, a small right vertebral artery and diffuse vertebrobasilar disease.  He had an attempted stenting by Dr. Monna which was unsuccessful.  He has been treated medically since then with aspirin  and clopidogrel  and has not had any additional stroke symptoms.  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  He has been having serial imaging, actually every 6 months, since then.  I have personally reviewed all of his MRA's over the last 9 years, none of which show any significant change.  He is currently not having any symptoms of stroke or TIA of any kind, and definitely not related to the posterior circulation.  Alert and oriented with normal speech expression, fluency and comprehension. Visual fields are full to confrontation.   Face is symmetric. Strength in the arms and legs is symmetric with no drift.  Sensation is normal and symmetric. No ataxia. No inattention.  I reviewed his medications which include 81 mg aspirin , 75 mg clopidogrel  and 20 mg rosuvastatin .  Assessment:  1.  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  No stroke symptoms since that time on medical therapy 2.  Asymptomatic 3 mm left cavernous aneurysm.  I explained to Alejandro Schneider that there is no surgical treatment for his vertebrobasilar atherosclerosis.  As such, I do not think that serial imaging will in any way change his treatment and is therefore unnecessary.  I have explained that the aneurysm is benign and does not need any treatment or follow-up.  Recommendation:  1.  Continue 81 mg aspirin  and 75 mg clopidogrel  for severe vertebrobasilar disease.  The clopidogrel  can be stopped as needed for surgical procedures, or if he develops bleeding. 2.  No routine follow-up imaging  needed.  I have not scheduled any follow-up with me but I am glad to see him again if necessary. 3.  No further follow-up needed for the cavernous aneurysm.  He did have questions about caffeine and alcohol.  I have told him that both can be used in moderation, as in a cup of coffee a day or 1-2 drinks per week.  I spent a total of 50 minutes with review of all of his imaging studies and consultation.

## 2024-01-16 DIAGNOSIS — K080 Exfoliation of teeth due to systemic causes: Secondary | ICD-10-CM | POA: Diagnosis not present

## 2024-02-13 DIAGNOSIS — L814 Other melanin hyperpigmentation: Secondary | ICD-10-CM | POA: Diagnosis not present

## 2024-02-13 DIAGNOSIS — L821 Other seborrheic keratosis: Secondary | ICD-10-CM | POA: Diagnosis not present

## 2024-02-13 DIAGNOSIS — D1801 Hemangioma of skin and subcutaneous tissue: Secondary | ICD-10-CM | POA: Diagnosis not present

## 2024-02-13 DIAGNOSIS — L570 Actinic keratosis: Secondary | ICD-10-CM | POA: Diagnosis not present

## 2024-11-15 ENCOUNTER — Ambulatory Visit

## 2024-11-16 ENCOUNTER — Ambulatory Visit
